# Patient Record
Sex: Female | Born: 1970 | Race: White | Hispanic: No | Marital: Married | Smoking: Former smoker
Health system: Southern US, Community
[De-identification: ages and names within clinical notes are randomized; demographics above are authoritative.]

## PROBLEM LIST (undated history)

## (undated) DIAGNOSIS — M958 Other specified acquired deformities of musculoskeletal system: Secondary | ICD-10-CM

## (undated) DIAGNOSIS — F32A Depression, unspecified: Secondary | ICD-10-CM

## (undated) DIAGNOSIS — J45909 Unspecified asthma, uncomplicated: Secondary | ICD-10-CM

## (undated) DIAGNOSIS — K219 Gastro-esophageal reflux disease without esophagitis: Secondary | ICD-10-CM

## (undated) DIAGNOSIS — R002 Palpitations: Secondary | ICD-10-CM

## (undated) DIAGNOSIS — I499 Cardiac arrhythmia, unspecified: Secondary | ICD-10-CM

## (undated) DIAGNOSIS — M1990 Unspecified osteoarthritis, unspecified site: Secondary | ICD-10-CM

## (undated) DIAGNOSIS — G43909 Migraine, unspecified, not intractable, without status migrainosus: Secondary | ICD-10-CM

## (undated) DIAGNOSIS — M2341 Loose body in knee, right knee: Secondary | ICD-10-CM

## (undated) DIAGNOSIS — G5600 Carpal tunnel syndrome, unspecified upper limb: Secondary | ICD-10-CM

## (undated) DIAGNOSIS — F329 Major depressive disorder, single episode, unspecified: Secondary | ICD-10-CM

## (undated) DIAGNOSIS — F419 Anxiety disorder, unspecified: Secondary | ICD-10-CM

## (undated) DIAGNOSIS — D649 Anemia, unspecified: Secondary | ICD-10-CM

## (undated) HISTORY — PX: TUBAL LIGATION: SHX77

## (undated) HISTORY — PX: DILATION AND CURETTAGE OF UTERUS: SHX78

## (undated) HISTORY — PX: WISDOM TOOTH EXTRACTION: SHX21

---
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
## 2001-04-26 ENCOUNTER — Other Ambulatory Visit: Admission: RE | Admit: 2001-04-26 | Discharge: 2001-04-26 | Payer: Self-pay | Admitting: Obstetrics and Gynecology

## 2002-05-01 ENCOUNTER — Other Ambulatory Visit: Admission: RE | Admit: 2002-05-01 | Discharge: 2002-05-01 | Payer: Self-pay | Admitting: *Deleted

## 2002-07-12 ENCOUNTER — Encounter: Payer: Self-pay | Admitting: Orthopedic Surgery

## 2002-07-12 ENCOUNTER — Ambulatory Visit (HOSPITAL_COMMUNITY): Admission: RE | Admit: 2002-07-12 | Discharge: 2002-07-12 | Payer: Self-pay | Admitting: Orthopedic Surgery

## 2003-09-27 ENCOUNTER — Inpatient Hospital Stay (HOSPITAL_COMMUNITY): Admission: RE | Admit: 2003-09-27 | Discharge: 2003-09-30 | Payer: Self-pay | Admitting: Obstetrics & Gynecology

## 2004-11-04 ENCOUNTER — Ambulatory Visit: Payer: Self-pay | Admitting: Internal Medicine

## 2005-03-18 ENCOUNTER — Observation Stay (HOSPITAL_COMMUNITY): Admission: AD | Admit: 2005-03-18 | Discharge: 2005-03-19 | Payer: Self-pay | Admitting: Internal Medicine

## 2005-03-18 ENCOUNTER — Ambulatory Visit: Payer: Self-pay | Admitting: Internal Medicine

## 2005-03-20 ENCOUNTER — Ambulatory Visit: Payer: Self-pay | Admitting: Internal Medicine

## 2005-03-23 ENCOUNTER — Inpatient Hospital Stay (HOSPITAL_COMMUNITY): Admission: AD | Admit: 2005-03-23 | Discharge: 2005-03-25 | Payer: Self-pay | Admitting: Internal Medicine

## 2005-03-23 ENCOUNTER — Ambulatory Visit: Payer: Self-pay | Admitting: Internal Medicine

## 2005-11-07 ENCOUNTER — Ambulatory Visit: Payer: Self-pay | Admitting: Internal Medicine

## 2006-04-06 ENCOUNTER — Ambulatory Visit: Payer: Self-pay | Admitting: Internal Medicine

## 2006-04-26 ENCOUNTER — Ambulatory Visit: Payer: Self-pay | Admitting: Internal Medicine

## 2006-05-18 ENCOUNTER — Ambulatory Visit: Payer: Self-pay | Admitting: Internal Medicine

## 2007-06-21 ENCOUNTER — Telehealth: Payer: Self-pay | Admitting: Internal Medicine

## 2007-07-04 ENCOUNTER — Encounter: Payer: Self-pay | Admitting: Internal Medicine

## 2007-08-20 DIAGNOSIS — J45909 Unspecified asthma, uncomplicated: Secondary | ICD-10-CM | POA: Insufficient documentation

## 2007-08-20 DIAGNOSIS — K219 Gastro-esophageal reflux disease without esophagitis: Secondary | ICD-10-CM | POA: Insufficient documentation

## 2007-08-24 ENCOUNTER — Telehealth: Payer: Self-pay | Admitting: Internal Medicine

## 2007-10-13 ENCOUNTER — Telehealth: Payer: Self-pay | Admitting: Internal Medicine

## 2007-10-13 ENCOUNTER — Ambulatory Visit: Payer: Self-pay | Admitting: Internal Medicine

## 2007-10-13 DIAGNOSIS — M25539 Pain in unspecified wrist: Secondary | ICD-10-CM | POA: Insufficient documentation

## 2007-10-24 ENCOUNTER — Ambulatory Visit: Payer: Self-pay | Admitting: Internal Medicine

## 2007-12-07 ENCOUNTER — Encounter: Payer: Self-pay | Admitting: Internal Medicine

## 2007-12-11 ENCOUNTER — Encounter: Admission: RE | Admit: 2007-12-11 | Discharge: 2007-12-11 | Payer: Self-pay | Admitting: Internal Medicine

## 2007-12-14 ENCOUNTER — Encounter: Payer: Self-pay | Admitting: Internal Medicine

## 2008-03-15 ENCOUNTER — Telehealth (INDEPENDENT_AMBULATORY_CARE_PROVIDER_SITE_OTHER): Payer: Self-pay | Admitting: *Deleted

## 2008-03-16 ENCOUNTER — Telehealth: Payer: Self-pay | Admitting: Internal Medicine

## 2008-03-21 ENCOUNTER — Ambulatory Visit: Payer: Self-pay | Admitting: Internal Medicine

## 2008-07-19 ENCOUNTER — Ambulatory Visit (HOSPITAL_BASED_OUTPATIENT_CLINIC_OR_DEPARTMENT_OTHER): Admission: RE | Admit: 2008-07-19 | Discharge: 2008-07-19 | Payer: Self-pay | Admitting: Orthopedic Surgery

## 2008-07-19 HISTORY — PX: OTHER SURGICAL HISTORY: SHX169

## 2008-08-20 ENCOUNTER — Ambulatory Visit: Payer: Self-pay | Admitting: Internal Medicine

## 2008-09-17 ENCOUNTER — Ambulatory Visit: Payer: Self-pay | Admitting: Internal Medicine

## 2008-09-19 ENCOUNTER — Encounter: Payer: Self-pay | Admitting: Internal Medicine

## 2008-12-20 ENCOUNTER — Ambulatory Visit: Payer: Self-pay | Admitting: Internal Medicine

## 2009-03-12 ENCOUNTER — Telehealth: Payer: Self-pay | Admitting: Internal Medicine

## 2009-03-15 ENCOUNTER — Ambulatory Visit: Payer: Self-pay | Admitting: Family Medicine

## 2009-06-28 ENCOUNTER — Ambulatory Visit: Payer: Self-pay | Admitting: Internal Medicine

## 2009-09-19 ENCOUNTER — Ambulatory Visit: Payer: Self-pay | Admitting: Family Medicine

## 2009-11-14 ENCOUNTER — Ambulatory Visit: Payer: Self-pay | Admitting: Family Medicine

## 2009-11-14 DIAGNOSIS — J309 Allergic rhinitis, unspecified: Secondary | ICD-10-CM | POA: Insufficient documentation

## 2010-12-10 ENCOUNTER — Ambulatory Visit (HOSPITAL_COMMUNITY): Admission: RE | Admit: 2010-12-10 | Payer: Self-pay | Source: Home / Self Care | Admitting: Obstetrics & Gynecology

## 2010-12-15 ENCOUNTER — Other Ambulatory Visit: Payer: Self-pay | Admitting: Obstetrics & Gynecology

## 2010-12-15 DIAGNOSIS — O09529 Supervision of elderly multigravida, unspecified trimester: Secondary | ICD-10-CM

## 2010-12-29 ENCOUNTER — Other Ambulatory Visit (HOSPITAL_COMMUNITY): Payer: Self-pay | Admitting: Obstetrics & Gynecology

## 2010-12-30 ENCOUNTER — Ambulatory Visit (HOSPITAL_COMMUNITY)
Admission: RE | Admit: 2010-12-30 | Discharge: 2010-12-30 | Disposition: A | Discharge status: Home / Self Care | Payer: Medicaid Other | Source: Ambulatory Visit | Attending: Obstetrics & Gynecology | Admitting: Obstetrics & Gynecology

## 2010-12-30 ENCOUNTER — Other Ambulatory Visit: Payer: Self-pay | Admitting: Obstetrics & Gynecology

## 2010-12-30 DIAGNOSIS — O09529 Supervision of elderly multigravida, unspecified trimester: Secondary | ICD-10-CM

## 2010-12-30 DIAGNOSIS — Z363 Encounter for antenatal screening for malformations: Secondary | ICD-10-CM | POA: Insufficient documentation

## 2010-12-30 DIAGNOSIS — O358XX0 Maternal care for other (suspected) fetal abnormality and damage, not applicable or unspecified: Secondary | ICD-10-CM | POA: Insufficient documentation

## 2010-12-30 DIAGNOSIS — Z1389 Encounter for screening for other disorder: Secondary | ICD-10-CM | POA: Insufficient documentation

## 2011-01-02 ENCOUNTER — Other Ambulatory Visit: Payer: Self-pay | Admitting: *Deleted

## 2011-01-02 DIAGNOSIS — K219 Gastro-esophageal reflux disease without esophagitis: Secondary | ICD-10-CM

## 2011-01-02 MED ORDER — ESOMEPRAZOLE MAGNESIUM 40 MG PO CPDR: 40.0000 mg | DELAYED_RELEASE_CAPSULE | Freq: Every day | ORAL | Status: DC

## 2011-01-02 MED ORDER — BUDESONIDE-FORMOTEROL FUMARATE 160-4.5 MCG/ACT IN AERO: 2.0000 | INHALATION_SPRAY | Freq: Two times a day (BID) | RESPIRATORY_TRACT | Status: DC

## 2011-01-11 ENCOUNTER — Emergency Department (HOSPITAL_COMMUNITY)
Admission: EM | Admit: 2011-01-11 | Discharge: 2011-01-11 | Disposition: A | Discharge status: Home / Self Care | Payer: Self-pay | Attending: Emergency Medicine | Admitting: Emergency Medicine

## 2011-01-11 DIAGNOSIS — R000 Tachycardia, unspecified: Secondary | ICD-10-CM | POA: Insufficient documentation

## 2011-01-11 DIAGNOSIS — J45909 Unspecified asthma, uncomplicated: Secondary | ICD-10-CM | POA: Insufficient documentation

## 2011-04-07 NOTE — Op Note (Signed)
[name], [name]              ACCOUNT NO.:  [id number]   MEDICAL RECORD NO.:  [id number]          PATIENT TYPE:  AMB   LOCATION:  DSC                          FACILITY:  [facility]   PHYSICIAN:  Katy Fitch. Sypher, M.D. DATE OF BIRTH:  Jan 05, 1971   DATE OF PROCEDURE:  07/19/2008  DATE OF DISCHARGE:                               OPERATIVE REPORT   PREOPERATIVE DIAGNOSIS:  Stage IIIB Kienbock disease, left wrist with  loss of lunate height carpal collapse and radiolunate angle greater than  70 degrees.   POSTOPERATIVE DIAGNOSIS:  Stage IIIB Kienbock disease, left wrist with  loss of lunate height carpal collapse and radiolunate angle greater than  70 degrees.   OPERATION:  Reduction of scaphoid collapse and scaphocapitate fusion  utilizing an 18-mm Acutrak II self-tapping screw and a distal 0.035-inch  Kirschner wire after reducing the radial lunate angle to approximately  40 degrees and augmented by local capitate and scaphoid bone graft.   OPERATIONS:  Katy Fitch. Sypher, MD   ASSISTANT:  Marveen Reeks Dasnoit, PA   ANESTHESIA:  General by LMA.  Supervising anesthesiologist is Burna Forts, MD   INDICATIONS:  Shelly [name] is a 40 year old woman who is referred for  evaluation and management of a painful and stiff left wrist.   She was seen in early 2009, it was noted to have stage II Kienbock  disease.   We initially proposed a radial shortening and revascularization of her  lunate, as she had not developed carpal collapse.   She elected to seek an alternative opinion and essentially waited 4  months while not protecting a wrist prior to returning for further  discussions.   Her second opinion concurred with the original treatment plan.   By the time she returned for her second discussion, she was noted to  have advanced to a stage IIIB Kienbock disease with the radiolunate  angle greater than 70 degrees; therefore, we recommended a salvage  procedure with  scaphocapitate arthrodesis.   After informed consent, she was brought to the operating room at this  time.   Preoperatively, she was advised that she would have permanent loss of  wrist range of motion following this procedure, although she already had  lost 50% of wrist motion due to her underlying pathology.   Preoperative questions were invited and answered in detail.  She does  understand that we cannot guarantee complete pain relief nor can we  guarantee that this will fuse with a single attempt.   Questions were invited and answered in detail.   PROCEDURE:  [name] was brought to the operating room and placed  in a supine position on the operating table.   Following an anesthesia consult with Dr. Jacklynn Bue, general anesthesia by  LMA was recommended and accepted.   She was brought to room #3, placed in a supine position on the operating  table, and under Dr. Marlane Mingle direct supervision, general anesthesia  by LMA technique induced.   The left arm was prepped with Betadine soap solution and sterilely  draped.  A pneumatic tourniquet was applied to the proximal  brachium.  Ancef 1 g was administered as an IV prophylactic antibiotic.   Procedure commenced. With exsanguination of the left arm with an Esmarch  bandage, an inflation of the arterial tourniquet to 220 mmHg.  A 3-cm  longitudinal incision was fashioned directly over the scapholunate  ligament and capitate.  Subcutaneous tissues were carefully divided  taking care to identify and spare the extensor retinaculum.  Dorsal  carpal arch was identified, electrocauterized, and a longitudinal  capsulotomy performed.   The scaphocapitate articulation was identified.  Reactive synovitis was  noted.  A synovectomy was performed to facilitate visualization.   The scaphoid was easily reducible to approximately a 40-degree  radioscaphoid angle.  The adjacent surfaces of the capitate and scaphoid  were denuded of  hyaline cartilage utilizing a 4-mm osteotome followed by  curettage with microcurette.  The adjacent margins were then feathered  repeatedly with a fine osteotome and the scaphoid reduced against the  capitate.   Provisional fixation with two 0.035-inch Kirschner wire was accomplished  followed by radiographic documentation of satisfactory reduction.   An 18-mm Acutrak II screw was used with hand reaming.  Drill guides were  used to try to prevent soft tissue entrapment.   The Acutrak screw was placed with standard compression technique leading  to a very satisfactory compression of the scaphoid against the capitate.  A distal 0.035-inch Kirschner wire was utilized to prevent malrotation.   Final AP and lateral C-arm images were obtained including an image  documented a degree of lunate collapse and deformity.   The wound was irrigated followed by repair of the dorsal capsule with  mattress suture of 3-0 Ethibond.  The subcutaneous tissues were repaired  with 4-0 Vicryl followed by intradermal 3-0 Prolene.   The tourniquet was released with immediate capillary refill.  There was  no evidence of bleeding from the radial artery.   A wrist block was placed with 2% lidocaine for postoperative analgesia.  This wound was then placed in a compressive dressing with a sugar-tong  splint maintaining the wrist in neutral position.   There were no apparent complications.      Katy Fitch Sypher, M.D.  Electronically Signed     RVS/MEDQ  D:  07/19/2008  T:  07/20/2008  Job:  [id number]   cc:   Lemmie Evens, M.D.

## 2011-04-10 NOTE — Discharge Summary (Signed)
NAME:  Shelly [name], Shelly [name]                        ACCOUNT NO.:  [id number]   MEDICAL RECORD NO.:  [id number]                   PATIENT TYPE:  INP   LOCATION:  9124                                 FACILITY:  [facility]   PHYSICIAN:  Charles A. Clearance Coots, M.D.             DATE OF BIRTH:  05/25/1971   DATE OF ADMISSION:  09/27/2003  DATE OF DISCHARGE:  09/30/2003                                 DISCHARGE SUMMARY   ADMISSION DIAGNOSIS:  Previous cesarean section declined trial of labor.   DISCHARGE DIAGNOSIS:  Previous cesarean section declined trial of labor  status post repeat low transverse cesarean section on September 27, 2003,  viable female at 1416, Apgars of 8 at one minute 9 at five minutes, weight  of 3990 grams, length of 52 cm.  Mother and infant discharged home in good  condition.   REASON FOR ADMISSION:  A 40 year old G 2, P 1-0-0-1 female with estimated  date of confinement of September 30, 2003 presents at 38-6/7 weeks for a  repeat cesarean section.  The patient declined an offer of a trial of labor.   PAST MEDICAL HISTORY:  Surgery:  Cesarean section.  Illnesses:  Asthma,  GERD.   MEDICATIONS:  Prenatal vitamins, Advair, Nexium.   ALLERGIES:  No known drug allergies.   SOCIAL HISTORY:  Married.  Negative tobacco, alcohol, or recreational drug  use.   FAMILY HISTORY:  Chronic obstructive pulmonary disease.   PHYSICAL EXAMINATION:  GENERAL:  A well-nourished, well-developed female in  no acute distress.  VITAL SIGNS:  Temperature 98.8, pulse 100, respiratory rate 20, blood  pressure 110/62.  Weight 212 pounds.  HEENT:  Normal.  LUNGS:  Clear to auscultation bilaterally.  HEART:  Regular rate and rhythm.  ABDOMEN:  Gravid, soft, nontender.  PELVIC:  Omitted.   HOSPITAL COURSE:  The patient underwent a repeat low transverse cesarean  section on September 27, 2003.  There were no intraoperative complications.  Postoperative course was uncomplicated.  The patient was discharged  home on  postoperative day #3 in good condition.   DISCHARGE LABORATORY DATA:  Hemoglobin 9, hematocrit 28.   DISCHARGE DISPOSITION:  Medications:  Tylox one to two tablets every 3 to 4  hours as needed for pain.  Routine written instructions were given for  obstetrical discharge post cesarean section.  The patient is to call our  office for a follow up in six weeks.                                               Charles A. Clearance Coots, M.D.    CAH/MEDQ  D:  10/11/2003  T:  10/12/2003  Job:  [id number]

## 2011-04-10 NOTE — H&P (Signed)
NAMEMingo [name]             ACCOUNT NO.:  [id number]   MEDICAL RECORD NO.:  [id number]           PATIENT TYPE:   LOCATION:                                 FACILITY:   PHYSICIAN:  Bruce Rexene Edison. Swords, M.D. [facility]   DATE OF BIRTH:   DATE OF ADMISSION:  03/23/2005  DATE OF DISCHARGE:                                HISTORY & PHYSICAL   CHIEF COMPLAINT:  Headache.   HISTORY OF PRESENT ILLNESS:  [name] is a 40 year old female generally  healthy.  She was admitted March 18, 2005 with a headache and fever, thought  to have aseptic meningitis.  Underwent an LP that essentially ruled out this  diagnosis.  She was found to have a round pneumonia.  With antibiotics her  symptoms have essentially resolved.  She has never had any significant  pulmonary symptoms.  Since leaving the hospital, every time she stands up  she has a profound headache.  It is relieved with lying down or bending  over.  She denies any neurologic deficits or any other complaints.   Her past medical history is significant for heartburn and asthma.   Past surgical history is significant for right knee arthroscopy, C section,  and D&C.   CURRENT MEDICATIONS:  1.  __________300 mg p.o. daily, last dose yesterday (needs three more days      of fluoroquinolone).  2.  Advair 150/50 one puff b.i.d.  3.  Nexium 40 mg daily.  4.  Prozac 20 mg p.o. daily.   SOCIAL HISTORY:  As outlined on note March 18, 2005.   REVIEW OF SYSTEMS:  Headache without neurologic deficits.  No other  complaints on the review of systems.   PHYSICAL EXAMINATION:  VITAL SIGNS:  98.8, blood pressure 110/60, heart rate  70, respirations 14.  GENERAL:  She appears well-developed, well-nourished female in no acute  distress.  HEENT:  Atraumatic, normocephalic.  Extraocular muscles are intact.  Conjunctivae are pink.  Sclerae are white.  Optic disks are normal.  No  signs of papilledema.  NECK:  Supple without lymphadenopathy, thyromegaly,  jugular venous  distention, carotid bruits.  No meningismus.  CHEST:  Clear to auscultation.  CARDIAC:  S1, S2 normal.  NEUROLOGIC:  She is alert and oriented without any motor or sensory  deficits.   LABORATORY DATA:  None.   Headache.  Likely, post LP headache. I saw her on Friday. She has been  essentially at bed rest since that time and has not had any relief of her  symptoms despite narcotic analgesics, Advil, and  Tylenol.  She needs  admission and likely a blood patch.  This has been discussed with  interventional radiology and they will evaluate the patient.      BHS/MEDQ  D:  03/23/2005  T:  03/23/2005  Job:  [id number]

## 2011-04-10 NOTE — Discharge Summary (Signed)
Shelly [name], Shelly [name]              ACCOUNT NO.:  [id number]   MEDICAL RECORD NO.:  [id number]          PATIENT TYPE:  INP   LOCATION:  5743                         FACILITY:  [facility]   PHYSICIAN:  Bruce Rexene Edison. Swords, M.D. Romualdo Bolk OF BIRTH:  1971/08/21   DATE OF ADMISSION:  03/18/2005  DATE OF DISCHARGE:  03/19/2005                                 DISCHARGE SUMMARY   DISCHARGE DIAGNOSES:  1.  Pneumonia.  2.  Asthma.   PAST MEDICAL HISTORY:  1.  Depression.  2.  Asthma.   HISTORY OF PRESENT ILLNESS:  The patient was admitted on March 18, 2005. The  patient awoke at 3 a.m. in the morning with chills, fever, and headache. The  patient reported positive photophobia at that time. The patient was admitted  to [facility] on March 18, 2005. The patient underwent  lumbar puncture. A CT of the head was performed which showed no acute  intracranial abnormality but did show some sinus disease.   HOSPITAL COURSE:  On admission, a LP was completed which did not suggest  meningitis. Blood cultures x 2 were sent. Currently, there is no growth to  date. CSF was also sent for culture. Currently, no growth per date. Gram-  stain showed few white blood cell present. CT of the head was negative. The  patient did have a chest x-ray which showed a dominant 5.5 cm round opacity  in the right upper lobe. Radiology thought this is more consistent with a  pediatric pneumonia due to the round nature of the opacity. However,  neoplastic process could not be ruled out. There were also single, smaller  nodular opacities over each lower lung. The patient did report having recent  contact with two children who are sick and her husband is currently  suffering from pneumonia.   The patient was given IV doses of Azithromycin and Rocephin prior to  discharge. Plan to discharge the patient home on p.o. Avelox. In addition,  the patient was also given prescription for Albuterol inhaler as she  reported  some chest tightness during this admission. However, lung sounds  remained clear on examination. O2 saturation this morning was 94% on room  air.   DISCHARGE MEDICATIONS:  1.  Avelox 400 mg 1 tablet daily for 7 days.  2.  Albuterol inhaler 2 puffs q.4h. p.r.n. chest tightness.   FOLLOW UP:  The patient is to follow up with Dr. Valetta Mole. Swords early next  week for follow-up visit in the office.      MSO/MEDQ  D:  03/19/2005  T:  03/20/2005  Job:  [id number]   cc:   Valetta Mole. Swords, M.D. [facility]

## 2011-04-10 NOTE — Discharge Summary (Signed)
NAMESEDA, Shelly [name]              ACCOUNT NO.:  [id number]   MEDICAL RECORD NO.:  [id number]          PATIENT TYPE:  INP   LOCATION:  6703                         FACILITY:  [facility]   PHYSICIAN:  Bruce Rexene Edison. Swords, M.D. Romualdo Bolk OF BIRTH:  13-Nov-1971   DATE OF ADMISSION:  03/23/2005  DATE OF DISCHARGE:  03/25/2005                                 DISCHARGE SUMMARY   DISCHARGE DIAGNOSES:  1.  Post lumbar puncture headache, status post blood patch.  2.  Asthma.  3.  Pneumonia.   HOSPITAL COURSE:  The patient was admitted to the hospital with headache,  status post lumbar puncture on Mar 23, 2005. The patient tolerated blood  patch well, headache resolved, and the patient was discharged on her current  medications.      Bruce Rexene Edison Swords, M.D. [facility]  Electronically Signed     BHS/MEDQ  D:  06/03/2005  T:  06/03/2005  Job:  [id number]

## 2011-04-10 NOTE — H&P (Signed)
Shelly, [name]              ACCOUNT NO.:  [id number]   MEDICAL RECORD NO.:  [id number]          PATIENT TYPE:  INP   LOCATION:  5743                         FACILITY:  [facility]   PHYSICIAN:  Bruce Rexene Edison. Swords, M.D. [facility] OF BIRTH:  1970/12/22   DATE OF ADMISSION:  03/18/2005  DATE OF DISCHARGE:                                HISTORY & PHYSICAL   CHIEF COMPLAINT:  Headache and fever.   HISTORY OF PRESENT ILLNESS:  [name] is a 40 year old female who is  generally healthy. She woke up at 3:00 this morning with fever, chills,  headache, myalgias. The symptoms have worsened and she now complains of  photophobia. In the office, she had an episode of nausea and vomiting, and  she had one this morning as well. She denies any sore throat, abdominal  pain, dysuria, or any other localizing symptoms or any other complaints in  the review of systems.   PAST MEDICAL HISTORY:  Significant for reflux disease and asthma.   PAST SURGICAL HISTORY:  Right knee arthroscopy, cesarean section , and a  D&C.   CURRENT MEDICATIONS:  1.  Multivitamin.  2.  Advair 100/50 one puff b.i.d.  3.  Prozac 20 mg p.o. daily.   SOCIAL HISTORY:  Husband was recently diagnosed with pneumonia. She has a  daughter who is ill and 87 months old, temperature 104. She has not had any  unusual contacts, no travel history, no unusual foods.   FAMILY HISTORY:  Noncontributory.   REVIEW OF SYMPTOMS:  As above.   PHYSICAL EXAMINATION:  VITAL SIGNS:  Temperature 103.5, blood pressure  118/74.  GENERAL:  She appears as an acutely ill female. Appears uncomfortable  keeping her eyes closed in a dark room.  HEENT:  Atraumatic, normocephalic. Extraocular muscles intact. Conjunctivae  are normal. Optic disks are normal without papilledema. ENT exam is normal.  Oropharynx is normal. Posterior pharynx is normal. Nasal mucosa is normal.  NECK:  Supple, however, when she flexes her neck fully, she does have  significant  neck discomfort and increases her headache. The thyroid is  normal.  CHEST:  Clear to auscultation without any increased work of breathing or  dullness to percussion.  CARDIAC:  S1, S2 are normal without murmurs, rubs, or gallops.  ABDOMEN:  Active bowel sounds, soft, nontender. There is no  hepatosplenomegaly.  EXTREMITIES:  There is no clubbing, cyanosis or edema.  NEUROLOGIC:  She is alert and oriented without any motor or sensory  deficits. Cranial nerves are intact.   ASSESSMENT AND PLAN:  Sudden onset, fever, chills, with marked headache,  photophobia, nausea and vomiting. I suspect she has viral meningitis. In the  office she was given Demerol and Phenergan. She needs hospitalization with  an lumbar puncture. Will treat empirically with Rocephin 2 g. Will obtain  appropriate cultures.      BHS/MEDQ  D:  03/18/2005  T:  03/18/2005  Job:  [id number]

## 2011-04-10 NOTE — Op Note (Signed)
NAME:  [name], Shelly [name]                        ACCOUNT NO.:  [id number]   MEDICAL RECORD NO.:  [id number]                   PATIENT TYPE:  INP   LOCATION:  9124                                 FACILITY:  [facility]   PHYSICIAN:  Roseanna Rainbow, M.D.         DATE OF BIRTH:  Dec 22, 1970   DATE OF PROCEDURE:  09/27/2003  DATE OF DISCHARGE:                                 OPERATIVE REPORT   PREOPERATIVE DIAGNOSES:  1. Intrauterine pregnancy at term.  2. History of previous cesarean delivery, declines trial of labor.   POSTOPERATIVE DIAGNOSES:  1. Intrauterine pregnancy at term.  2. History of previous cesarean delivery, declines trial of labor.   PROCEDURE:  Repeat low uterine flap elliptical cesarean delivery via  Pfannenstiel skin incision.   SURGEONS:  Roseanna Rainbow, M.D., Bing Neighbors. Clearance Coots, M.D.   ANESTHESIA:  Spinal.   ESTIMATED BLOOD LOSS:  600 mL.   FLUIDS AND URINE OUTPUT:  As per anesthesiology.   COMPLICATIONS:  None.   INDICATIONS:  The patient is a 40 year old para 1 with a history of previous  cesarean delivery, now with an intrauterine pregnancy at term, who declines  a trial of labor.   DESCRIPTION OF PROCEDURE:  The patient was taken to the operating room.  A  spinal anesthetic was placed without difficulty.  She was then placed in the  dorsal supine position with a leftward tilt.  She was then prepped and  draped in the usual sterile fashion.  A Pfannenstiel skin incision was then  made with the scalpel, carried down to the underlying fascia with the Bovie.  The fascia was incised in the midline and this incision was extended  bilaterally with the Bovie.  The superior aspect of the fascial incision was  then grasped with Kocher clamps, tented up, and the underlying rectus  muscles dissected off.  The inferior aspect of the fascial incision was then  manipulated in a similar fashion.  The rectus muscles were separated in the  midline.  The parietal  peritoneum was tented up and entered sharply with  Metzenbaum scissors. This incision was then extended superiorly and  inferiorly with good visualization of the bladder.  The bladder blade was  then placed and the vesicouterine peritoneum was tented up and entered  sharply with Metzenbaum scissors.  This incision was then extended  bilaterally and the bladder flap created sharply.  The bladder blade was  then replaced.  The lower uterine segment was then incised in a transverse  fashion with the scalpel.  The uterine incision was then extended with  bandage scissors.  The head was then delivered atraumatically.  The infant  was suctioned with the bulb suction, the cord was clamped and cut, and the  infant was handed off to the awaiting neonatologist.  The placenta was then  removed.  The uterus was evacuated of amniotic fluid, clots, and debris with  a moistened laparotomy sponge.  The uterine incision was then repaired in a  running interlocking fashion with 0 Monocryl.  A second layer of the same  suture was used to imbricate this layer.  Adequate hemostasis was noted.  The paracolic gutters were then copiously irrigated.  The parietal  peritoneum was reapproximated with  2-0 Vicryl.  The fascia was reapproximated with 0 PDS.  The skin was then  reapproximated with staples.  At the close of the procedure the instrument  and pack counts were said to be correct x2.  The patient was taken to the  PACU awake and in stable condition.                                               Roseanna Rainbow, M.D.    Judee Clara  D:  09/27/2003  T:  09/27/2003  Job:  [id number]

## 2011-05-05 ENCOUNTER — Inpatient Hospital Stay (HOSPITAL_COMMUNITY)
Admission: AD | Admit: 2011-05-05 | Discharge: 2011-05-06 | Disposition: A | Discharge status: Home / Self Care | Payer: Medicaid Other | Source: Ambulatory Visit | Attending: Obstetrics | Admitting: Obstetrics

## 2011-05-05 DIAGNOSIS — O99891 Other specified diseases and conditions complicating pregnancy: Secondary | ICD-10-CM | POA: Insufficient documentation

## 2011-05-05 DIAGNOSIS — R42 Dizziness and giddiness: Secondary | ICD-10-CM | POA: Insufficient documentation

## 2011-05-05 LAB — URINALYSIS, ROUTINE W REFLEX MICROSCOPIC
Ketones, ur: NEGATIVE mg/dL
Nitrite: NEGATIVE
Protein, ur: NEGATIVE mg/dL
pH: 6 (ref 5.0–8.0)

## 2011-05-05 LAB — URINE MICROSCOPIC-ADD ON

## 2011-05-06 LAB — COMPREHENSIVE METABOLIC PANEL
CO2: 23 mEq/L (ref 19–32)
Calcium: 8.9 mg/dL (ref 8.4–10.5)
Creatinine, Ser: 0.71 mg/dL (ref 0.4–1.2)
GFR calc Af Amer: >60 mL/min (ref >60)
GFR calc non Af Amer: >60 mL/min (ref >60)
Glucose, Bld: 96 mg/dL (ref 70–99)
Total Protein: 5.5 g/dL — ABNORMAL LOW (ref 6.0–8.3)

## 2011-06-02 ENCOUNTER — Encounter (HOSPITAL_COMMUNITY)
Admission: RE | Admit: 2011-06-02 | Discharge: 2011-06-02 | Disposition: A | Discharge status: Home / Self Care | Payer: Medicaid Other | Source: Ambulatory Visit | Attending: Obstetrics & Gynecology | Admitting: Obstetrics & Gynecology

## 2011-06-02 ENCOUNTER — Other Ambulatory Visit (HOSPITAL_COMMUNITY): Payer: Medicaid Other

## 2011-06-02 ENCOUNTER — Encounter (HOSPITAL_COMMUNITY): Payer: Self-pay

## 2011-06-02 HISTORY — DX: Gastro-esophageal reflux disease without esophagitis: K21.9

## 2011-06-02 HISTORY — DX: Depression, unspecified: F32.A

## 2011-06-02 HISTORY — DX: Anxiety disorder, unspecified: F41.9

## 2011-06-02 HISTORY — DX: Major depressive disorder, single episode, unspecified: F32.9

## 2011-06-02 LAB — CBC
Hemoglobin: 9.7 g/dL — ABNORMAL LOW (ref 12.0–15.0)
RBC: 3.62 MIL/uL — ABNORMAL LOW (ref 3.87–5.11)
WBC: 9.7 10*3/uL (ref 4.0–10.5)

## 2011-06-02 LAB — SURGICAL PCR SCREEN
MRSA, PCR: NEGATIVE
Staphylococcus aureus: NEGATIVE

## 2011-06-02 LAB — RPR: RPR Ser Ql: NONREACTIVE

## 2011-06-02 MED ORDER — CEFAZOLIN SODIUM-DEXTROSE 2-3 GM-% IV SOLR
2.0000 g | INTRAVENOUS | Status: AC
  Administered 2011-06-03: 2 g via INTRAVENOUS
  Filled 2011-06-02: qty 50

## 2011-06-02 NOTE — Patient Instructions (Addendum)
20 Shelly [name]  06/02/2011   Your procedure is scheduled on:  Wednesday  Report to [facility] at 12 noon Call this number if you have problems the morning of surgery: [phone]   Remember:   Do not eat food:After Midnight.  Do not drink clear liquids: 4 Hours before arrival.  Take these medicines the morning of surgery with A SIP OF WATER:  None   Do not wear jewelry, make-up or nail polish.  Do not bring valuables to the hospital.  Contacts, dentures or bridgework may not be worn into surgery.  Leave suitcase in the car. After surgery it may be brought to your room.  For patients admitted to the hospital, checkout time is 11:00 AM the day of discharge.   Patients discharged the day of surgery will not be allowed to drive home.  Name and phone number of your driver: x  Special Instructions: Bring Inhaler day of surgery

## 2011-06-02 NOTE — H&P (Signed)
Preoperative H&P  Chief complaint: The patient is a 40 year old para 2 with an intrauterine pregnancy at term and a history of 2 previous cesarean deliveries, also desires a sterilization procedure, and now for an elective repeat cesarean delivery and bilateral tubal ligation.  History of present illness: Please see the above.  Past GYN history: D&C, ASCUS Pap smear.  Past medical history: Asthma, recurrent urinary tract infections  Past surgical history: Please see the above. Left wrist for bone fusion.  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 She denies any tobacco ethanol or drug use.  Family history: Diabetes mellitus, hypertension.  Allergies: No known drug allergies.  Medications: Please see the medication reconciliation documentation.  OB risk factors: Advanced maternal age, history of previous cesarean deliveries  Prenatal labs: Hepatitis B surface antigen negative, hemoglobin 12.1, platelets 256,000, blood type A+, antibody screen negative, rubella immune, RPR nonreactive,  Past obstetrical history: Spontaneous abortion x 1, voluntary termination of pregnancy x 1; November, 2004 the patient was delivered of an 8 pound female, full-term, via a cesarean delivery;  May,2000 she was delivered of a liveborn female, birth weight 7 pounds, full term via a cesarean delivery.   Physical examination  Vital signs: Vital signs stable afebrile, no apparent distress  General: Well-developed well-nourished Caucasian female in no apparent distress  Chest: Lungs clear to auscultation bilaterally new  Heart: Regular rate and rhythm, no murmurs  Abdomen: Fundal height term. Nontender  Pelvic exam: Deferred  Extremities: No clubbing cyanosis or edema  Assessment and plan:  Intrauterine pregnancy at 39+ weeks with a history of 2 previous cesarean deliveries, desires a sterilization procedure now for an elective repeat cesarean delivery and bilateral tubal ligation.  The risks benefits and alternative forms of management have been reviewed with the patient and informed consent had been obtained.  Questions were answered to the patient's stated satisfaction.  Admit. Repeat cesarean delivery and bilateral tubal ligation.

## 2011-06-03 ENCOUNTER — Encounter (HOSPITAL_COMMUNITY)
Admission: RE | Disposition: A | Discharge status: Home / Self Care | Payer: Self-pay | Source: Ambulatory Visit | Attending: Obstetrics & Gynecology

## 2011-06-03 ENCOUNTER — Encounter (HOSPITAL_COMMUNITY): Payer: Self-pay | Admitting: *Deleted

## 2011-06-03 ENCOUNTER — Encounter (HOSPITAL_COMMUNITY): Payer: Self-pay | Admitting: Anesthesiology

## 2011-06-03 ENCOUNTER — Inpatient Hospital Stay (HOSPITAL_COMMUNITY)
Admission: RE | Admit: 2011-06-03 | Discharge: 2011-06-05 | DRG: 766 | Disposition: A | Discharge status: Home / Self Care | Payer: Medicaid Other | Source: Ambulatory Visit | Attending: Obstetrics & Gynecology | Admitting: Obstetrics & Gynecology

## 2011-06-03 ENCOUNTER — Inpatient Hospital Stay (HOSPITAL_COMMUNITY): Payer: Medicaid Other | Admitting: Anesthesiology

## 2011-06-03 ENCOUNTER — Other Ambulatory Visit: Payer: Self-pay | Admitting: Obstetrics & Gynecology

## 2011-06-03 DIAGNOSIS — O09529 Supervision of elderly multigravida, unspecified trimester: Secondary | ICD-10-CM | POA: Diagnosis present

## 2011-06-03 DIAGNOSIS — O34219 Maternal care for unspecified type scar from previous cesarean delivery: Principal | ICD-10-CM | POA: Diagnosis present

## 2011-06-03 LAB — TYPE AND SCREEN
ABO/RH(D): A POS
Antibody Screen: NEGATIVE

## 2011-06-03 SURGERY — Surgical Case
Anesthesia: Spinal

## 2011-06-03 MED ORDER — SODIUM CHLORIDE 0.9 % IR SOLN
Status: DC | PRN
  Administered 2011-06-03: 1000 mL

## 2011-06-03 MED ORDER — ONDANSETRON HCL 4 MG/2ML IJ SOLN
INTRAMUSCULAR | Status: DC | PRN
  Administered 2011-06-03: 4 mg via INTRAVENOUS

## 2011-06-03 MED ORDER — KETOROLAC TROMETHAMINE 30 MG/ML IJ SOLN: 30.0000 mg | Freq: Four times a day (QID) | INTRAMUSCULAR | Status: DC | PRN

## 2011-06-03 MED ORDER — SIMETHICONE 80 MG PO CHEW
80.0000 mg | CHEWABLE_TABLET | ORAL | Status: DC | PRN
  Administered 2011-06-03 – 2011-06-04 (×4): 80 mg via ORAL

## 2011-06-03 MED ORDER — OXYTOCIN 20 UNITS IN LACTATED RINGERS INFUSION - SIMPLE: 125.0000 mL/h | INTRAVENOUS | Status: AC

## 2011-06-03 MED ORDER — HYDROMORPHONE HCL 1 MG/ML IJ SOLN
0.2500 mg | INTRAMUSCULAR | Status: DC | PRN
  Administered 2011-06-03 (×2): 0.5 mg via INTRAVENOUS

## 2011-06-03 MED ORDER — LACTATED RINGERS IV SOLN
INTRAVENOUS | Status: DC
  Administered 2011-06-03 (×4): via INTRAVENOUS

## 2011-06-03 MED ORDER — IBUPROFEN 200 MG PO TABS: 600.0000 mg | ORAL_TABLET | Freq: Four times a day (QID) | ORAL | Status: DC | PRN

## 2011-06-03 MED ORDER — MEPERIDINE HCL 25 MG/ML IJ SOLN
INTRAMUSCULAR | Status: DC | PRN
  Administered 2011-06-03: 25 mg via INTRAVENOUS

## 2011-06-03 MED ORDER — TETANUS-DIPHTH-ACELL PERTUSSIS 5-2.5-18.5 LF-MCG/0.5 IM SUSP
0.5000 mL | Freq: Once | INTRAMUSCULAR | Status: DC
  Filled 2011-06-03: qty 0.5

## 2011-06-03 MED ORDER — SCOPOLAMINE 1 MG/3DAYS TD PT72
1.0000 | MEDICATED_PATCH | Freq: Once | TRANSDERMAL | Status: DC
  Administered 2011-06-03: 12.5 mg via TRANSDERMAL

## 2011-06-03 MED ORDER — KETOROLAC TROMETHAMINE 60 MG/2ML IM SOLN: 60.0000 mg | Freq: Once | INTRAMUSCULAR | Status: DC | PRN

## 2011-06-03 MED ORDER — FENTANYL CITRATE 0.05 MG/ML IJ SOLN
INTRAMUSCULAR | Status: DC | PRN
  Administered 2011-06-03: 15 ug via INTRATHECAL
  Administered 2011-06-03: 85 ug via INTRAVENOUS

## 2011-06-03 MED ORDER — MORPHINE SULFATE (PF) 0.5 MG/ML IJ SOLN
INTRAMUSCULAR | Status: DC | PRN
  Administered 2011-06-03: 9.9 mg via EPIDURAL
  Administered 2011-06-03: .1 mg via INTRATHECAL

## 2011-06-03 MED ORDER — MEPERIDINE HCL 25 MG/ML IJ SOLN: 6.2500 mg | INTRAMUSCULAR | Status: DC | PRN

## 2011-06-03 MED ORDER — ALBUTEROL SULFATE HFA 108 (90 BASE) MCG/ACT IN AERS
1.0000 | INHALATION_SPRAY | Freq: Every day | RESPIRATORY_TRACT | Status: DC | PRN
  Filled 2011-06-03: qty 6.7

## 2011-06-03 MED ORDER — DIPHENHYDRAMINE HCL 25 MG PO CAPS: 25.0000 mg | ORAL_CAPSULE | Freq: Four times a day (QID) | ORAL | Status: DC | PRN

## 2011-06-03 MED ORDER — BUPIVACAINE HCL 0.75 % IJ SOLN
INTRAMUSCULAR | Status: DC | PRN
  Administered 2011-06-03: 11.75 mg via INTRATHECAL

## 2011-06-03 MED ORDER — PHENYLEPHRINE HCL 10 MG/ML IJ SOLN
INTRAMUSCULAR | Status: DC | PRN
  Administered 2011-06-03 (×6): 40 ug via INTRAVENOUS

## 2011-06-03 MED ORDER — ONDANSETRON HCL 4 MG/2ML IJ SOLN
INTRAMUSCULAR | Status: AC
  Filled 2011-06-03: qty 2

## 2011-06-03 MED ORDER — MEPERIDINE HCL 25 MG/ML IJ SOLN
INTRAMUSCULAR | Status: AC
  Filled 2011-06-03: qty 1

## 2011-06-03 MED ORDER — WITCH HAZEL-GLYCERIN EX PADS: MEDICATED_PAD | CUTANEOUS | Status: DC | PRN

## 2011-06-03 MED ORDER — NALBUPHINE HCL 10 MG/ML IJ SOLN
5.0000 mg | INTRAMUSCULAR | Status: AC | PRN
  Filled 2011-06-03: qty 1

## 2011-06-03 MED ORDER — OXYTOCIN 20 UNITS IN LACTATED RINGERS INFUSION - SIMPLE
INTRAVENOUS | Status: DC | PRN
  Administered 2011-06-03 (×2): 20 [IU] via INTRAVENOUS

## 2011-06-03 MED ORDER — SENNOSIDES-DOCUSATE SODIUM 8.6-50 MG PO TABS
1.0000 | ORAL_TABLET | Freq: Every day | ORAL | Status: DC
Start: 2011-06-03 — End: 2011-06-06
  Administered 2011-06-03 – 2011-06-04 (×2): 2 via ORAL

## 2011-06-03 MED ORDER — ZOLPIDEM TARTRATE 5 MG PO TABS: 5.0000 mg | ORAL_TABLET | Freq: Every evening | ORAL | Status: DC | PRN

## 2011-06-03 MED ORDER — NALOXONE HCL 0.4 MG/ML IJ SOLN: 0.4000 mg | INTRAMUSCULAR | Status: DC | PRN

## 2011-06-03 MED ORDER — METOCLOPRAMIDE HCL 5 MG/ML IJ SOLN: 10.0000 mg | Freq: Three times a day (TID) | INTRAMUSCULAR | Status: DC | PRN

## 2011-06-03 MED ORDER — ONDANSETRON HCL 4 MG/2ML IJ SOLN: 4.0000 mg | Freq: Three times a day (TID) | INTRAMUSCULAR | Status: DC | PRN

## 2011-06-03 MED ORDER — SODIUM CHLORIDE 0.9 % IJ SOLN: 3.0000 mL | INTRAMUSCULAR | Status: DC | PRN

## 2011-06-03 MED ORDER — PRENATAL PLUS 27-1 MG PO TABS
1.0000 | ORAL_TABLET | Freq: Every day | ORAL | Status: DC
  Administered 2011-06-04 – 2011-06-05 (×2): 1 via ORAL
  Filled 2011-06-03 (×2): qty 1

## 2011-06-03 MED ORDER — OXYCODONE-ACETAMINOPHEN 5-325 MG PO TABS
1.0000 | ORAL_TABLET | ORAL | Status: DC | PRN
  Administered 2011-06-04 (×3): 1 via ORAL
  Administered 2011-06-05: 2 via ORAL
  Filled 2011-06-03: qty 2
  Filled 2011-06-03 (×3): qty 1

## 2011-06-03 MED ORDER — MORPHINE SULFATE 0.5 MG/ML IJ SOLN
INTRAMUSCULAR | Status: AC
  Filled 2011-06-03: qty 10

## 2011-06-03 MED ORDER — DIPHENHYDRAMINE HCL 50 MG/ML IJ SOLN: 25.0000 mg | INTRAMUSCULAR | Status: DC | PRN

## 2011-06-03 MED ORDER — RHO D IMMUNE GLOBULIN 1500 UNIT/2ML IJ SOLN: 300.0000 ug | Freq: Once | INTRAMUSCULAR | Status: DC

## 2011-06-03 MED ORDER — EPHEDRINE 5 MG/ML INJ
INTRAVENOUS | Status: AC
  Filled 2011-06-03: qty 10

## 2011-06-03 MED ORDER — BUDESONIDE-FORMOTEROL FUMARATE 160-4.5 MCG/ACT IN AERO
1.0000 | INHALATION_SPRAY | Freq: Every day | RESPIRATORY_TRACT | Status: DC
  Filled 2011-06-03: qty 6

## 2011-06-03 MED ORDER — SCOPOLAMINE 1 MG/3DAYS TD PT72
MEDICATED_PATCH | TRANSDERMAL | Status: AC
  Administered 2011-06-03: 12.5 mg via TRANSDERMAL
  Filled 2011-06-03: qty 1

## 2011-06-03 MED ORDER — EPHEDRINE SULFATE 50 MG/ML IJ SOLN
INTRAMUSCULAR | Status: DC | PRN
  Administered 2011-06-03 (×2): 5 mg via INTRAVENOUS
  Administered 2011-06-03: 10 mg via INTRAVENOUS

## 2011-06-03 MED ORDER — PHENYLEPHRINE 40 MCG/ML (10ML) SYRINGE FOR IV PUSH (FOR BLOOD PRESSURE SUPPORT)
PREFILLED_SYRINGE | INTRAVENOUS | Status: AC
  Filled 2011-06-03: qty 5

## 2011-06-03 MED ORDER — ONDANSETRON HCL 4 MG/2ML IJ SOLN: 4.0000 mg | INTRAMUSCULAR | Status: DC | PRN

## 2011-06-03 MED ORDER — DIPHENHYDRAMINE HCL 50 MG/ML IJ SOLN
12.5000 mg | INTRAMUSCULAR | Status: DC | PRN
  Administered 2011-06-03: 12.5 mg via INTRAVENOUS

## 2011-06-03 MED ORDER — NALOXONE HCL 0.4 MG/ML IJ SOLN: 1.0000 ug/kg/h | INTRAMUSCULAR | Status: DC | PRN

## 2011-06-03 MED ORDER — FENTANYL CITRATE 0.05 MG/ML IJ SOLN
INTRAMUSCULAR | Status: AC
  Filled 2011-06-03: qty 2

## 2011-06-03 MED ORDER — OXYTOCIN 10 UNIT/ML IJ SOLN
INTRAMUSCULAR | Status: AC
  Filled 2011-06-03: qty 2

## 2011-06-03 MED ORDER — IBUPROFEN 600 MG PO TABS
ORAL_TABLET | ORAL | Status: AC
  Administered 2011-06-03: 18:00:00
  Filled 2011-06-03: qty 1

## 2011-06-03 MED ORDER — MEASLES, MUMPS & RUBELLA VAC ~~LOC~~ INJ
0.5000 mL | INJECTION | Freq: Once | SUBCUTANEOUS | Status: DC
  Filled 2011-06-03: qty 0.5

## 2011-06-03 MED ORDER — DIPHENHYDRAMINE HCL 25 MG PO CAPS
25.0000 mg | ORAL_CAPSULE | ORAL | Status: DC | PRN
  Filled 2011-06-03: qty 1

## 2011-06-03 MED ORDER — FERROUS SULFATE 325 (65 FE) MG PO TABS
325.0000 mg | ORAL_TABLET | Freq: Two times a day (BID) | ORAL | Status: DC
  Administered 2011-06-04 – 2011-06-05 (×4): 325 mg via ORAL
  Filled 2011-06-03 (×4): qty 1

## 2011-06-03 MED ORDER — IBUPROFEN 600 MG PO TABS
600.0000 mg | ORAL_TABLET | Freq: Four times a day (QID) | ORAL | Status: DC
  Administered 2011-06-03 – 2011-06-05 (×8): 600 mg via ORAL
  Filled 2011-06-03 (×9): qty 1

## 2011-06-03 MED ORDER — MAGNESIUM HYDROXIDE 400 MG/5ML PO SUSP: 30.0000 mL | ORAL | Status: DC | PRN

## 2011-06-03 SURGICAL SUPPLY — 43 items
CANISTER WOUND CARE 500ML ATS (WOUND CARE) IMPLANT
CHLORAPREP W/TINT 26ML (MISCELLANEOUS) ×2 IMPLANT
CLOTH BEACON ORANGE TIMEOUT ST (SAFETY) ×2 IMPLANT
CONTAINER PREFILL 10% NBF 15ML (MISCELLANEOUS) ×2 IMPLANT
DERMABOND ADVANCED (GAUZE/BANDAGES/DRESSINGS) ×2 IMPLANT
DRAPE UTILITY XL STRL (DRAPES) ×2 IMPLANT
DRESSING TELFA 8X3 (GAUZE/BANDAGES/DRESSINGS) ×2 IMPLANT
DRSG VAC ATS LRG SENSATRAC (GAUZE/BANDAGES/DRESSINGS) IMPLANT
DRSG VAC ATS MED SENSATRAC (GAUZE/BANDAGES/DRESSINGS) IMPLANT
DRSG VAC ATS SM SENSATRAC (GAUZE/BANDAGES/DRESSINGS) IMPLANT
ELECT REM PT RETURN 9FT ADLT (ELECTROSURGICAL) ×2
ELECTRODE REM PT RTRN 9FT ADLT (ELECTROSURGICAL) ×1 IMPLANT
EXTRACTOR VACUUM M CUP 4 TUBE (SUCTIONS) ×1 IMPLANT
GAUZE SPONGE 4X4 12PLY STRL LF (GAUZE/BANDAGES/DRESSINGS) ×4 IMPLANT
GLOVE BIO SURGEON STRL SZ 6.5 (GLOVE) ×4 IMPLANT
GOWN BRE IMP SLV AUR LG STRL (GOWN DISPOSABLE) ×6 IMPLANT
KIT ABG SYR 3ML LUER SLIP (SYRINGE) IMPLANT
NDL HYPO 25X5/8 SAFETYGLIDE (NEEDLE) ×1 IMPLANT
NEEDLE HYPO 25X5/8 SAFETYGLIDE (NEEDLE) ×2 IMPLANT
NS IRRIG 1000ML POUR BTL (IV SOLUTION) ×2 IMPLANT
PACK C SECTION WH (CUSTOM PROCEDURE TRAY) ×2 IMPLANT
PAD ABD 5X9 TENDERSORB (GAUZE/BANDAGES/DRESSINGS) ×1 IMPLANT
PAD ABD 7.5X8 STRL (GAUZE/BANDAGES/DRESSINGS) ×2 IMPLANT
RTRCTR C-SECT PINK 25CM LRG (MISCELLANEOUS) IMPLANT
RTRCTR C-SECT PINK 34CM XLRG (MISCELLANEOUS) IMPLANT
SLEEVE SCD COMPRESS KNEE MED (MISCELLANEOUS) IMPLANT
STAPLER VISISTAT 35W (STAPLE) ×2 IMPLANT
SUT MNCRL 0 VIOLET CTX 36 (SUTURE) ×2 IMPLANT
SUT MNCRL AB 3-0 PS2 27 (SUTURE) ×2 IMPLANT
SUT MONOCRYL 0 CTX 36 (SUTURE) ×2
SUT PDS AB 0 CT1 27 (SUTURE) IMPLANT
SUT PLAIN 0 NONE (SUTURE) ×1 IMPLANT
SUT VIC AB 0 CT1 27 (SUTURE)
SUT VIC AB 0 CT1 27XBRD ANBCTR (SUTURE) IMPLANT
SUT VIC AB 0 CTXB 36 (SUTURE) ×4 IMPLANT
SUT VIC AB 2-0 CT1 (SUTURE) IMPLANT
SUT VIC AB 2-0 CT1 27 (SUTURE) ×2
SUT VIC AB 2-0 CT1 TAPERPNT 27 (SUTURE) ×1 IMPLANT
SUT VIC AB 2-0 SH 27 (SUTURE)
SUT VIC AB 2-0 SH 27XBRD (SUTURE) IMPLANT
TOWEL OR 17X24 6PK STRL BLUE (TOWEL DISPOSABLE) ×4 IMPLANT
TRAY FOLEY CATH 14FR (SET/KITS/TRAYS/PACK) ×2 IMPLANT
WATER STERILE IRR 1000ML POUR (IV SOLUTION) ×2 IMPLANT

## 2011-06-03 NOTE — Transfer of Care (Signed)
Immediate Anesthesia Transfer of Care Note  Patient: Shelly [name]  Procedure(s) Performed:  CESAREAN SECTION WITH BILATERAL TUBAL LIGATION - Repeat  Patient Location: PACU  Anesthesia Type: Spinal  Level of Consciousness: awake, alert , oriented and patient cooperative  Airway & Oxygen Therapy: Patient Spontanous Breathing and Patient connected to nasal cannula oxygen  Post-op Assessment: Report given to PACU RN and Post -op Vital signs reviewed and stable  Post vital signs: Reviewed and stable  Complications: No apparent anesthesia complications

## 2011-06-03 NOTE — Anesthesia Preprocedure Evaluation (Addendum)
Anesthesia Evaluation  Name, MR# and DOB Patient awake  General Assessment Comment  Reviewed: Allergy & Precautions, H&P  and Patient's Chart, lab work & pertinent test results  History of Anesthesia Complications Negative for: history of anesthetic complications  Airway Mallampati: I TM Distance: >3 FB Neck ROM: full    Dental  (+) Teeth Intact   Pulmonary  asthma (well controlled, uses rescue inh 2-3 x/week)  clear to auscultation  pulmonary exam normal   Cardiovascular regular Normal   Neuro/Psych (+) {AN ROS/MED HX NEURO HEADACHES (+) PSYCHIATRIC DISORDERS (depression, anxiety), Negative Neurological ROS   GI/Hepatic/Renal negative Liver ROS, and negative Renal ROS (+)  GERD Medicated and Controlled     Endo/Other  Negative Endocrine ROS (+)   Abdominal   Musculoskeletal  Hematology negative hematology ROS (+)   Peds  Reproductive/Obstetrics (+) Pregnancy   Anesthesia Other Findings             Anesthesia Physical Anesthesia Plan  ASA: II  Anesthesia Plan: Spinal   Post-op Pain Management:    Induction:   Airway Management Planned:   Additional Equipment:   Intra-op Plan:   Post-operative Plan:   Informed Consent: I have reviewed the patients History and Physical, chart, labs and discussed the procedure including the risks, benefits and alternatives for the proposed anesthesia with the patient or authorized representative who has indicated his/her understanding and acceptance.     Plan Discussed with: CRNA and Surgeon  Anesthesia Plan Comments:         Anesthesia Quick Evaluation

## 2011-06-03 NOTE — Consult Note (Signed)
Delivery Note   06/03/2011  2:36 PM  I was called to the operating room at the request of the patient's obstetrician.  The c/section delivery was uncomplicated.  The baby looked well.     Ruben Gottron, MD Neonatologist

## 2011-06-03 NOTE — H&P (Signed)
Preoperative H&P  Chief complaint: The patient is a 40 year old para 2 with an intrauterine pregnancy at term and a history of 2 previous cesarean deliveries, also desires a sterilization procedure, and now for an elective repeat cesarean delivery and bilateral tubal ligation.  History of present illness: Please see the above.  Past GYN history: D&C, ASCUS Pap smear.  Past medical history: Asthma, recurrent urinary tract infections  Past surgical history: Please see the above. Left wrist for bone fusion.  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 She denies any tobacco ethanol or drug use.  Family history: Diabetes mellitus, hypertension.  Allergies: No known drug allergies.  Medications: Please see the medication reconciliation documentation.  OB risk factors: Advanced maternal age, history of previous cesarean deliveries  Prenatal labs: Hepatitis B surface antigen negative, hemoglobin 12.1, platelets 256,000, blood type A+, antibody screen negative, rubella immune, RPR nonreactive,  Past obstetrical history: Spontaneous abortion x 1, voluntary termination of pregnancy x 1; November, 2004 the patient was delivered of an 8 pound female, full-term, via a cesarean delivery;  May,2000 she was delivered of a liveborn female, birth weight 7 pounds, full term via a cesarean delivery.   Physical examination  Vital signs: Vital signs stable afebrile, no apparent distress  General: Well-developed well-nourished Caucasian female in no apparent distress  Chest: Lungs clear to auscultation bilaterally new  Heart: Regular rate and rhythm, no murmurs  Abdomen: Fundal height term. Nontender  Pelvic exam: Deferred  Extremities: No clubbing cyanosis or edema  Assessment and plan:  Intrauterine pregnancy at 39+ weeks with a history of 2 previous cesarean deliveries, desires a sterilization procedure now for an elective repeat cesarean delivery and bilateral tubal ligation.  The risks benefits and alternative forms of management have been reviewed with the patient and informed consent had been obtained.  Questions were answered to the patient's stated satisfaction.  Admit. Repeat cesarean delivery and bilateral tubal ligation.

## 2011-06-03 NOTE — Anesthesia Procedure Notes (Signed)
Spinal Block  Patient location during procedure: OR Start time: 06/03/2011 1:38 PM Staffing Anesthesiologist: CASSIDY, AMY L. Performed by: anesthesiologist  Preanesthetic Checklist Completed: patient identified, site marked, surgical consent, pre-op evaluation, timeout performed, IV checked, risks and benefits discussed and monitors and equipment checked Spinal Block Patient position: sitting Prep: site prepped and draped and DuraPrep Patient monitoring: blood pressure, continuous pulse ox and heart rate Approach: midline Location: L3-4 Injection technique: single-shot Needle Needle type: Sprotte and Introducer  Needle gauge: 24 G Needle length: 5 cm Assessment Sensory level: T4 Additional Notes 1% lidocaine skin wheal, clear free flow CSF x1 attempt

## 2011-06-03 NOTE — Brief Op Note (Signed)
Cesarean Section Procedure Note   [name]   06/03/2011  Indications: Scheduled Proceedure/Maternal Request   Pre-operative Diagnosis: Previous cesarean section.   Post-operative Diagnosis: Same   Surgeon: Antionette Char A  Assistants: Coral Ceo, MD  Anesthesia: spinal  Procedure Details:  The patient was seen in the Holding Room. The risks, benefits, complications, treatment options, and expected outcomes were discussed with the patient. The patient concurred with the proposed plan, giving informed consent. The patient was identified as Shelly [name] and the procedure verified as C-Section Delivery. A Time Out was held and the above information confirmed.  After induction of anesthesia, the patient was draped and prepped in the usual sterile manner. A transverse incision was made and carried down through the subcutaneous tissue to the fascia. The fascial incision was made and extended transversely. The fascia was separated from the underlying rectus tissue superiorly and inferiorly. The peritoneum was identified and entered. The peritoneal incision was extended longitudinally. The utero-vesical peritoneal reflection was incised transversely and the bladder flap was bluntly freed from the lower uterine segment. A low transverse uterine incision was made. Delivered from cephalic presentation was a 7lb 6oz  living newborn femaleinfant(s) with Apgar scores of 9 at one minute and 9 at five minutes. A vacuum-assisted delivery was performed.  There was one pop-off.  A cord ph was not sent. The umbilical cord was clamped and cut cord. A sample was obtained for evaluation. The placenta was removed Intact and appeared normal.  The uterine incision was closed with running locked sutures of 1-0 Monocryl. A second imbricating layer of the same suture was placed.  Hemostasis was observed. The paracolic gutters were irrigated. The fascia was then reapproximated with running sutures of  1-0Vicryl. The subcuticular closure was performed using 3-0 Monocryl.  Instrument, sponge, and needle counts were correct prior the abdominal closure and were correct at the conclusion of the case.    Findings: See above.   Estimated Blood Loss:  600 ml  Total IV Fluids: 2000 ml   Urine Output: 300CC OF clear urine  Specimens:  Specimens    None       Complications: no complications  Disposition: PACU - hemodynamically stable.  Maternal Condition: stable   Baby condition / location:  nursery-stable    Signed: Surgeon(s): Roseanna Rainbow, MD Brock Bad, MD

## 2011-06-03 NOTE — H&P (Signed)
History and Physical Interval Note:   06/03/2011   1:16 PM   Berlie Ventrella  has presented today for surgery, with the diagnosis of Previous cesarean section  The various methods of treatment have been discussed with the patient and family. After consideration of risks, benefits and other options for treatment, the patient has consented to  Procedure(s): CESAREAN SECTION WITH BILATERAL TUBAL LIGATION as a surgical intervention .  I have reviewed the patients' chart and labs.  Questions were answered to the patient's satisfaction.     Roseanna Rainbow  MD

## 2011-06-03 NOTE — H&P (Deleted)
Preoperative H&P  Chief complaint: The patient is a 40-year-old para 2 with an intrauterine pregnancy at term and a history of 2 previous cesarean deliveries, also desires a sterilization procedure, and now for an elective repeat cesarean delivery and bilateral tubal ligation.  History of present illness: Please see the above.  Past GYN history: D&C, ASCUS Pap smear.  Past medical history: Asthma, recurrent urinary tract infections  Past surgical history: Please see the above. Left wrist for bone fusion.  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 She denies any tobacco ethanol or drug use.  Family history: Diabetes mellitus, hypertension.  Allergies: No known drug allergies.  Medications: Please see the medication reconciliation documentation.  OB risk factors: Advanced maternal age, history of previous cesarean deliveries  Prenatal labs: Hepatitis B surface antigen negative, hemoglobin 12.1, platelets 256,000, blood type A+, antibody screen negative, rubella immune, RPR nonreactive,  Past obstetrical history: Spontaneous abortion x 1, voluntary termination of pregnancy x 1; November, 2004 the patient was delivered of an 8 pound female, full-term, via a cesarean delivery;  May,2000 she was delivered of a liveborn female, birth weight 7 pounds, full term via a cesarean delivery.   Physical examination  Vital signs: Vital signs stable afebrile, no apparent distress  General: Well-developed well-nourished Caucasian female in no apparent distress  Chest: Lungs clear to auscultation bilaterally new  Heart: Regular rate and rhythm, no murmurs  Abdomen: Fundal height term. Nontender  Pelvic exam: Deferred  Extremities: No clubbing cyanosis or edema  Assessment and plan:  Intrauterine pregnancy at 39+ weeks with a history of 2 previous cesarean deliveries, desires a sterilization procedure now for an elective repeat cesarean delivery and bilateral tubal ligation.  The risks benefits and alternative forms of management have been reviewed with the patient and informed consent had been obtained.  Questions were answered to the patient's stated satisfaction.  Admit. Repeat cesarean delivery and bilateral tubal ligation. 

## 2011-06-03 NOTE — Anesthesia Postprocedure Evaluation (Signed)
  Anesthesia Post-op Note  Patient: Shelly [name]  Procedure(s) Performed:  CESAREAN SECTION WITH BILATERAL TUBAL LIGATION - Repeat  No anesthesia complications.  Level of consciousness: alert. Cardiopulmonary status stable.  No follow-up care or observation required.  Franciscojavier Wronski L. Rodman Pickle, MD

## 2011-06-04 LAB — CBC
HCT: 25.3 % — ABNORMAL LOW (ref 36.0–46.0)
MCHC: 32 g/dL (ref 30.0–36.0)
Platelets: 162 10*3/uL (ref 150–400)
RDW: 14.5 % (ref 11.5–15.5)
WBC: 10 10*3/uL (ref 4.0–10.5)

## 2011-06-04 NOTE — Progress Notes (Signed)
UR chart review completed.  

## 2011-06-05 ENCOUNTER — Encounter (HOSPITAL_COMMUNITY): Payer: Self-pay | Admitting: *Deleted

## 2011-06-05 MED ORDER — PRENATAL PLUS 27-1 MG PO TABS: 1.0000 | ORAL_TABLET | Freq: Every day | ORAL | Status: DC

## 2011-06-05 MED ORDER — IBUPROFEN 600 MG PO TABS: 600.0000 mg | ORAL_TABLET | Freq: Four times a day (QID) | ORAL | Status: AC

## 2011-06-05 MED ORDER — OXYCODONE-ACETAMINOPHEN 5-325 MG PO TABS: 1.0000 | ORAL_TABLET | ORAL | Status: AC | PRN

## 2011-06-05 NOTE — Discharge Summary (Signed)
Obstetric Discharge Summary Reason for Admission: cesarean section Prenatal Procedures: ultrasound Intrapartum Procedures: cesarean: low cervical, transverse Postpartum Procedures: none Complications-Operative and Postpartum: none  Hemoglobin  Date Value Range Status  06/04/2011 8.1* 12.0-15.0 (g/dL) Final     HCT  Date Value Range Status  06/04/2011 25.3* 36.0-46.0 (%) Final    Discharge Diagnoses: Elective Repeat Cesarean Section.`  Discharge Information: Date: 06/05/2011 Activity: unrestricted Diet: routine Medications: PNV, Ibuprophen and Percocet Condition: stable Instructions: refer to practice specific booklet Discharge to: home Follow-up Information    Follow up with Roseanna Rainbow, MD. Make an appointment in 2 weeks.   Contact information:   [address] [place] [address] [phone]          Newborn Data: Live born  Information for the patient's newborn:  Heslin, Girl Jetaun [[id number]]  female ; APGAR 9-9 , ; weight ; 7lbs. 6oz. Home with mother.  Dyamon Sosinski A 06/05/2011, 4:50 PM

## 2011-06-05 NOTE — Progress Notes (Signed)
Subjective: Postpartum Day 2: Cesarean Delivery Patient reports tolerating PO, + flatus, + BM and no problems voiding.    Objective: Vital signs in last 24 hours: Temp:  [97.6 F (36.4 C)-98.6 F (37 C)] 98.5 F (36.9 C) (07/13 1300) Pulse Rate:  [64-82] 80  (07/13 1300) Resp:  [18-20] 18  (07/13 1300) BP: (108-126)/(56-73) 126/73 mmHg (07/13 1300) SpO2:  [92 %] 92 % (07/12 2200)  Physical Exam:  General: alert and no distress Lochia: appropriate Uterine Fundus: firm Incision: healing well DVT Evaluation: No evidence of DVT seen on physical exam.   Basename 06/04/11 0515  HGB 8.1*  HCT 25.3*    Assessment/Plan: Status post Cesarean section. Doing well postoperatively.  Discharge home with standard precautions and return to clinic in 2 weeks.  Nevena Rozenberg A 06/05/2011, 4:47 PM

## 2011-07-06 NOTE — Consult Note (Signed)
NAME:  Shelly [name], Shelly [name] NO.:  [id number]  MEDICAL RECORD NO.:  [id number]  LOCATION:                                 FACILITY:  PHYSICIAN:  Charles A. Clearance Coots, M.D.DATE OF BIRTH:  Jul 21, 1971  DATE OF CONSULTATION:  06/12/2011 DATE OF DISCHARGE:                                CONSULTATION   HISTORY:  A 40 year old female, intrauterine pregnancy at 70 weeks' gestation, history of previous cesarean section presents with complaint of uterine contractions.  The patient was afebrile.  Vital signs were stable.  On cervical exam, the cervix was long and closed.  External fetal monitoring revealed mild uterine contractions.  The patient was monitored for a prolonged period and was found not to be in labor. Uterine contractions spaced out and the patient was therefore discharged home, not in labor, undelivered at 39 weeks' gestation in good condition.     Charles A. Clearance Coots, M.D.     CAH/MEDQ  D:  07/05/2011  T:  07/05/2011  Job:  [id number]

## 2011-08-13 NOTE — Progress Notes (Signed)
Subjective: POD# 1 s/p Cesarean Delivery.  Indications: elective  RH status/Rubella reviewed. Feeding: unknown Patient reports tolerating PO.  Denies HA/SOB/C/P/N/V/dizziness.  Reports flatus or BM. Breast symptoms: None  She reports vaginal bleeding as normal, without clots.  She is ambulating, urinating without difficulty.     Objective: Vital signs in last 24 hours: BP 126/73  Pulse 80  Temp(Src) 98.5 F (36.9 C) (Oral)  Resp 18  Wt 95.255 kg (210 lb)  SpO2 92%  LMP 09/02/2010  Breastfeeding? Unknown       Physical Exam:  General: alert CV: Regular rate and rhythm Resp: clear Abdomen: soft, nontender, normal bowel sounds Lochia: minimal Uterine Fundus: firm, below umbilicus, nontender Incision: clean, dry and intact Ext: extremities normal, atraumatic, no cyanosis or edema    Assessment/Plan: 40 y.o.  status post Cesarean section. POD# 1.   Doing well, stable.              Advance diet as tolerated Start po pain meds D/C foley  HLIV  Ambulate IS Routine post-op care  JACKSON-MOORE,Acie Custis A 08/13/2011, 6:03 PM

## 2011-09-26 ENCOUNTER — Emergency Department (HOSPITAL_COMMUNITY)
Admission: EM | Admit: 2011-09-26 | Discharge: 2011-09-26 | Disposition: A | Discharge status: Home / Self Care | Payer: Self-pay | Attending: Emergency Medicine | Admitting: Emergency Medicine

## 2011-09-26 DIAGNOSIS — R059 Cough, unspecified: Secondary | ICD-10-CM | POA: Insufficient documentation

## 2011-09-26 DIAGNOSIS — R0789 Other chest pain: Secondary | ICD-10-CM | POA: Insufficient documentation

## 2011-09-26 DIAGNOSIS — J45909 Unspecified asthma, uncomplicated: Secondary | ICD-10-CM | POA: Insufficient documentation

## 2011-09-26 DIAGNOSIS — R000 Tachycardia, unspecified: Secondary | ICD-10-CM | POA: Insufficient documentation

## 2011-09-26 DIAGNOSIS — R05 Cough: Secondary | ICD-10-CM | POA: Insufficient documentation

## 2013-04-06 ENCOUNTER — Ambulatory Visit (INDEPENDENT_AMBULATORY_CARE_PROVIDER_SITE_OTHER): Payer: BC Managed Care – PPO | Admitting: Internal Medicine

## 2013-04-06 ENCOUNTER — Encounter: Payer: Self-pay | Admitting: Internal Medicine

## 2013-04-06 VITALS — BP 130/80 | HR 76 | Temp 98.6°F | Resp 20 | Wt 168.0 lb

## 2013-04-06 DIAGNOSIS — S2096XA Insect bite (nonvenomous) of unspecified parts of thorax, initial encounter: Secondary | ICD-10-CM

## 2013-04-06 DIAGNOSIS — S30860A Insect bite (nonvenomous) of lower back and pelvis, initial encounter: Secondary | ICD-10-CM

## 2013-04-06 DIAGNOSIS — J45909 Unspecified asthma, uncomplicated: Secondary | ICD-10-CM

## 2013-04-06 DIAGNOSIS — W57XXXA Bitten or stung by nonvenomous insect and other nonvenomous arthropods, initial encounter: Secondary | ICD-10-CM

## 2013-04-06 MED ORDER — TRIAMCINOLONE ACETONIDE 0.1 % EX CREA: TOPICAL_CREAM | Freq: Two times a day (BID) | CUTANEOUS | Status: DC

## 2013-04-06 MED ORDER — MONTELUKAST SODIUM 10 MG PO TABS: 10.0000 mg | ORAL_TABLET | Freq: Every day | ORAL | Status: DC

## 2013-04-06 NOTE — Patient Instructions (Addendum)
Aerospan 2 puffs daily (along with Symbicort once daily)  Or 2 puffs twice daily (and d/c Symbicort)  Call or return to clinic prn if these symptoms worsen or fail to improve as anticipated.

## 2013-04-06 NOTE — Progress Notes (Signed)
Subjective:    Patient ID: Shelly [name], female    DOB: 06-May-1971, 42 y.o.   MRN: [id number]  HPI 42 year old patient who has chronic asthma. She states her asthma but has not been well-controlled. Medical regimen includes Symbicort but she is only able to take this daily Do to the stimulatory side effects of the bronchodilator. Her chief complaint today  Is redness and itching about a tick bite 2 days ago. She also has had a intermittent painful varicosity involving her left inner thigh just proximal to the knee. She has had 3 pregnancies   Past Medical History  Diagnosis Date  . GERD (gastroesophageal reflux disease)   . Depression   . Anxiety     History   Social History  . Marital Status: Married    Spouse Name: N/A    Number of Children: N/A  . Years of Education: N/A   Occupational History  . Not on file.   Social History Main Topics  . Smoking status: Former Games developer  . Smokeless tobacco: Not on file  . Alcohol Use: No  . Drug Use: No  . Sexually Active: Not Currently    Birth Control/ Protection: None   Other Topics Concern  . Not on file   Social History Narrative  . No narrative on file    Past Surgical History  Procedure Laterality Date  . C/s x2    . Arthoscopic knee      No family history on file.  No Known Allergies  Current Outpatient Prescriptions on File Prior to Visit  Medication Sig Dispense Refill  . albuterol (PROVENTIL HFA) 108 (90 BASE) MCG/ACT inhaler Inhale 1 puff into the lungs daily as needed.        . budesonide-formoterol (SYMBICORT) 160-4.5 MCG/ACT inhaler Inhale 1 puff into the lungs daily.        Marland Kitchen esomeprazole (NEXIUM) 40 MG capsule Take 40 mg by mouth daily.         No current facility-administered medications on file prior to visit.    BP 130/80  Pulse 76  Temp(Src) 98.6 F (37 C) (Oral)  Resp 20  Wt 168 lb (76.204 kg)  BMI 27.96 kg/m2  SpO2 96%  LMP 03/27/2013  Breastfeeding? No       Review of Systems   Constitutional: Negative.   HENT: Negative for hearing loss, congestion, sore throat, rhinorrhea, dental problem, sinus pressure and tinnitus.   Eyes: Negative for pain, discharge and visual disturbance.  Respiratory: Positive for cough and wheezing. Negative for shortness of breath.   Cardiovascular: Negative for chest pain, palpitations and leg swelling.  Gastrointestinal: Negative for nausea, vomiting, abdominal pain, diarrhea, constipation, blood in stool and abdominal distention.  Genitourinary: Negative for dysuria, urgency, frequency, hematuria, flank pain, vaginal bleeding, vaginal discharge, difficulty urinating, vaginal pain and pelvic pain.  Musculoskeletal: Negative for joint swelling, arthralgias and gait problem.  Skin: Positive for rash.  Neurological: Negative for dizziness, syncope, speech difficulty, weakness, numbness and headaches.  Hematological: Negative for adenopathy.  Psychiatric/Behavioral: Negative for behavioral problems, dysphoric mood and agitation. The patient is not nervous/anxious.        Objective:   Physical Exam  Constitutional: She is oriented to person, place, and time. She appears well-developed and well-nourished.  HENT:  Head: Normocephalic.  Right Ear: External ear normal.  Left Ear: External ear normal.  Mouth/Throat: Oropharynx is clear and moist.  Eyes: Conjunctivae and EOM are normal. Pupils are equal, round, and reactive to light.  Neck:  Normal range of motion. Neck supple. No thyromegaly present.  Cardiovascular: Normal rate, regular rhythm, normal heart sounds and intact distal pulses.   Pulmonary/Chest: Effort normal and breath sounds normal. No respiratory distress. She has no wheezes.  Abdominal: Soft. Bowel sounds are normal. She exhibits no mass. There is no tenderness.  Musculoskeletal: Normal range of motion.  Lymphadenopathy:    She has no cervical adenopathy.  Neurological: She is alert and oriented to person, place, and time.   Skin: Skin is warm and dry. Rash noted.  Patchy erythema of about 3 cm involving her left chest wall area  Psychiatric: She has a normal mood and affect. Her behavior is normal.          Assessment & Plan:  Tick  bite with secondary allergic reaction Asthma suboptimal control with inability to tolerate twice a day Symbicort. The patient will be given samples of a  Inhaled ICS which she will use twice daily  Will treat with triamcinolone for the local skin rash.

## 2013-06-13 ENCOUNTER — Other Ambulatory Visit: Payer: Self-pay

## 2013-06-13 DIAGNOSIS — Z1231 Encounter for screening mammogram for malignant neoplasm of breast: Secondary | ICD-10-CM

## 2013-06-15 ENCOUNTER — Encounter: Payer: Self-pay | Admitting: Internal Medicine

## 2013-06-15 ENCOUNTER — Ambulatory Visit (INDEPENDENT_AMBULATORY_CARE_PROVIDER_SITE_OTHER): Payer: BC Managed Care – PPO | Admitting: Internal Medicine

## 2013-06-15 VITALS — BP 112/80 | HR 83 | Resp 20 | Wt 171.0 lb

## 2013-06-15 DIAGNOSIS — J45909 Unspecified asthma, uncomplicated: Secondary | ICD-10-CM

## 2013-06-15 DIAGNOSIS — J309 Allergic rhinitis, unspecified: Secondary | ICD-10-CM

## 2013-06-15 MED ORDER — PREDNISONE 20 MG PO TABS: 20.0000 mg | ORAL_TABLET | Freq: Two times a day (BID) | ORAL | Status: DC

## 2013-06-15 MED ORDER — BUDESONIDE 180 MCG/ACT IN AEPB: 2.0000 | INHALATION_SPRAY | Freq: Two times a day (BID) | RESPIRATORY_TRACT | Status: DC

## 2013-06-15 NOTE — Patient Instructions (Addendum)
Prednisone twice daily for 1 week as discussed  Pulmicort 2 puffs twice daily (may substitute Symbicort 1 puff daily)  Return in 3 months for follow-up

## 2013-06-15 NOTE — Progress Notes (Signed)
Subjective:    Patient ID: Shelly [name], female    DOB: 05/26/1971, 42 y.o.   MRN: [id number]  HPI  42 year old patient who is seen today in followup. She has a history of allergic rhinitis and asthma.  She states that she has been about the same after being placed on a more intensive regimen of ICS. She states that she uses albuterol at least twice weekly. She has a congested cough that has been chronic. She has been intolerant of more than one inhalation of Symbicort daily She also describes some vertigo intermittently over the past couple of weeks.  Past Medical History  Diagnosis Date  . GERD (gastroesophageal reflux disease)   . Depression   . Anxiety     History   Social History  . Marital Status: Married    Spouse Name: N/A    Number of Children: N/A  . Years of Education: N/A   Occupational History  . Not on file.   Social History Main Topics  . Smoking status: Former Games developer  . Smokeless tobacco: Not on file  . Alcohol Use: No  . Drug Use: No  . Sexually Active: Not Currently    Birth Control/ Protection: None   Other Topics Concern  . Not on file   Social History Narrative  . No narrative on file    Past Surgical History  Procedure Laterality Date  . C/s x2    . Arthoscopic knee      No family history on file.  No Known Allergies  Current Outpatient Prescriptions on File Prior to Visit  Medication Sig Dispense Refill  . albuterol (PROVENTIL HFA) 108 (90 BASE) MCG/ACT inhaler Inhale 1 puff into the lungs daily as needed.        . budesonide-formoterol (SYMBICORT) 160-4.5 MCG/ACT inhaler Inhale 1 puff into the lungs daily.        . montelukast (SINGULAIR) 10 MG tablet Take 1 tablet (10 mg total) by mouth at bedtime.  30 tablet  3  . triamcinolone cream (KENALOG) 0.1 % Apply topically 2 (two) times daily.  30 g  0   No current facility-administered medications on file prior to visit.    BP 112/80  Pulse 83  Resp 20  Wt 171 lb (77.565 kg)  BMI  28.46 kg/m2  SpO2 95%  LMP 06/08/2013       Review of Systems  Constitutional: Negative.   HENT: Positive for congestion. Negative for hearing loss, sore throat, rhinorrhea, dental problem, sinus pressure and tinnitus.   Eyes: Negative for pain, discharge and visual disturbance.  Respiratory: Positive for cough, shortness of breath and wheezing.   Cardiovascular: Negative for chest pain, palpitations and leg swelling.  Gastrointestinal: Negative for nausea, vomiting, abdominal pain, diarrhea, constipation, blood in stool and abdominal distention.  Genitourinary: Negative for dysuria, urgency, frequency, hematuria, flank pain, vaginal bleeding, vaginal discharge, difficulty urinating, vaginal pain and pelvic pain.  Musculoskeletal: Negative for joint swelling, arthralgias and gait problem.  Skin: Negative for rash.  Neurological: Positive for dizziness. Negative for syncope, speech difficulty, weakness, numbness and headaches.  Hematological: Negative for adenopathy.  Psychiatric/Behavioral: Negative for behavioral problems, dysphoric mood and agitation. The patient is not nervous/anxious.        Objective:   Physical Exam  Constitutional: She is oriented to person, place, and time. She appears well-developed and well-nourished.  HENT:  Head: Normocephalic.  Right Ear: External ear normal.  Left Ear: External ear normal.  Mouth/Throat: Oropharynx is clear and moist.  Eyes: Conjunctivae and EOM are normal. Pupils are equal, round, and reactive to light.  Neck: Normal range of motion. Neck supple. No thyromegaly present.  Cardiovascular: Normal rate, regular rhythm, normal heart sounds and intact distal pulses.   Pulmonary/Chest: Effort normal and breath sounds normal. No respiratory distress. She has no wheezes. She has no rales.  O2 saturation 95%  Abdominal: Soft. Bowel sounds are normal. She exhibits no mass. There is no tenderness.  Musculoskeletal: Normal range of motion.   Lymphadenopathy:    She has no cervical adenopathy.  Neurological: She is alert and oriented to person, place, and time.  Skin: Skin is warm and dry. No rash noted.  Psychiatric: She has a normal mood and affect. Her behavior is normal.          Assessment & Plan:   Chronic asthma. Suboptimal control. We'll place on Pulmicort 2 inhalations twice a day We'll give a trial of oral prednisone to see if asthma is responsive. We'll consider pulmonary/allergy referral depending on her clinical response

## 2013-06-26 ENCOUNTER — Ambulatory Visit
Admission: RE | Admit: 2013-06-26 | Discharge: 2013-06-26 | Disposition: A | Discharge status: Home / Self Care | Payer: BC Managed Care – PPO | Source: Ambulatory Visit

## 2013-06-26 DIAGNOSIS — Z1231 Encounter for screening mammogram for malignant neoplasm of breast: Secondary | ICD-10-CM

## 2013-06-28 ENCOUNTER — Other Ambulatory Visit: Payer: Self-pay | Admitting: Internal Medicine

## 2013-06-28 ENCOUNTER — Other Ambulatory Visit: Payer: Self-pay | Admitting: *Deleted

## 2013-06-28 DIAGNOSIS — R928 Other abnormal and inconclusive findings on diagnostic imaging of breast: Secondary | ICD-10-CM

## 2013-06-28 MED ORDER — FLUCONAZOLE 100 MG PO TABS: 200.0000 mg | ORAL_TABLET | ORAL | Status: DC

## 2013-07-12 ENCOUNTER — Ambulatory Visit
Admission: RE | Admit: 2013-07-12 | Discharge: 2013-07-12 | Disposition: A | Discharge status: Home / Self Care | Payer: BC Managed Care – PPO | Source: Ambulatory Visit | Attending: Internal Medicine | Admitting: Internal Medicine

## 2013-07-12 DIAGNOSIS — R928 Other abnormal and inconclusive findings on diagnostic imaging of breast: Secondary | ICD-10-CM

## 2013-09-20 ENCOUNTER — Ambulatory Visit (INDEPENDENT_AMBULATORY_CARE_PROVIDER_SITE_OTHER): Payer: BC Managed Care – PPO | Admitting: Obstetrics & Gynecology

## 2013-09-20 ENCOUNTER — Encounter: Payer: Self-pay | Admitting: Obstetrics & Gynecology

## 2013-09-20 VITALS — BP 139/84 | HR 90 | Temp 99.2°F | Ht 65.0 in | Wt 171.0 lb

## 2013-09-20 DIAGNOSIS — Z01419 Encounter for gynecological examination (general) (routine) without abnormal findings: Secondary | ICD-10-CM

## 2013-09-20 DIAGNOSIS — N926 Irregular menstruation, unspecified: Secondary | ICD-10-CM

## 2013-09-20 LAB — COMPREHENSIVE METABOLIC PANEL
ALT: 14 U/L (ref 0–35)
CO2: 25 mEq/L (ref 19–32)
Calcium: 9.2 mg/dL (ref 8.4–10.5)
Chloride: 102 mEq/L (ref 96–112)
Creat: 0.85 mg/dL (ref 0.50–1.10)
Glucose, Bld: 95 mg/dL (ref 70–99)
Sodium: 139 mEq/L (ref 135–145)
Total Protein: 7.2 g/dL (ref 6.0–8.3)

## 2013-09-20 LAB — TSH: TSH: 1.102 u[IU]/mL (ref 0.350–4.500)

## 2013-09-20 LAB — CBC
Hemoglobin: 12.7 g/dL (ref 12.0–15.0)
MCH: 28.6 pg (ref 26.0–34.0)
MCV: 86.5 fL (ref 78.0–100.0)
Platelets: 268 10*3/uL (ref 150–400)
RBC: 4.44 MIL/uL (ref 3.87–5.11)
WBC: 9.9 10*3/uL (ref 4.0–10.5)

## 2013-09-20 NOTE — Progress Notes (Signed)
Subjective:     Shelly [name] is a 42 y.o. female here for a routine exam.  Current complaints: Patient is in the office today for annual exam- patient reports cycles can be long or short- ? About menopause.  Personal health questionnaire reviewed: no.   Gynecologic History Patient's last menstrual period was 08/28/2013. Contraception: tubal ligation Last Pap: 1 year +. Results were: normal Last mammogram: last month. Results were: normal  Obstetric History OB History  Gravida Para Term Preterm AB SAB TAB Ectopic Multiple Living  6 2 2  1 1         # Outcome Date GA Lbr Len/2nd Weight Sex Delivery Anes PTL Lv  6 SAB           5 TRM           4 TRM           3 GRA              Comments: System Generated. Please review and update pregnancy details.  2 GRA           1 GRA                The following portions of the patient's history were reviewed and updated as appropriate: allergies, current medications, past family history, past medical history, past social history, past surgical history and problem list.  Review of Systems Pertinent items are noted in HPI.    Objective:      General appearance: alert Breasts: normal appearance, no masses or tenderness Abdomen: soft, non-tender; bowel sounds normal; no masses,  no organomegaly Pelvic: cervix normal in appearance, external genitalia normal, no adnexal masses or tenderness, uterus normal size, shape, and consistency and vagina normal without discharge    Assessment:    AUB  Plan:  Return prn    Orders Placed This Encounter  Procedures  . CBC  . Comprehensive metabolic panel  . TSH  . Prolactin  . POCT urine pregnancy

## 2013-09-21 ENCOUNTER — Encounter: Payer: Self-pay | Admitting: Obstetrics & Gynecology

## 2013-09-21 ENCOUNTER — Ambulatory Visit (INDEPENDENT_AMBULATORY_CARE_PROVIDER_SITE_OTHER): Payer: BC Managed Care – PPO | Admitting: Internal Medicine

## 2013-09-21 ENCOUNTER — Encounter: Payer: Self-pay | Admitting: Internal Medicine

## 2013-09-21 VITALS — BP 120/70 | HR 95 | Temp 98.0°F | Resp 20 | Wt 169.0 lb

## 2013-09-21 DIAGNOSIS — J45909 Unspecified asthma, uncomplicated: Secondary | ICD-10-CM

## 2013-09-21 DIAGNOSIS — J309 Allergic rhinitis, unspecified: Secondary | ICD-10-CM

## 2013-09-21 LAB — PROLACTIN: Prolactin: 6.3 ng/mL

## 2013-09-21 MED ORDER — FLUNISOLIDE HFA 80 MCG/ACT IN AERS: 2.0000 | INHALATION_SPRAY | Freq: Two times a day (BID) | RESPIRATORY_TRACT | Status: DC

## 2013-09-21 MED ORDER — MONTELUKAST SODIUM 10 MG PO TABS: 10.0000 mg | ORAL_TABLET | Freq: Every day | ORAL | Status: DC

## 2013-09-21 NOTE — Patient Instructions (Signed)

## 2013-09-21 NOTE — Patient Instructions (Signed)
Asthma Prevention  Cigarette smoke, house dust, molds, pollens, animal dander, certain insects, exercise, and even cold air are all triggers that can cause an asthma attack. Often, no specific triggers are identified.   Take the following measures around your house to reduce attacks:  · Avoid cigarette and other smoke. No smoking should be allowed in a home where someone with asthma lives. If smoking is allowed indoors, it should be done in a room with a closed door, and a window should be opened to clear the air. If possible, do not use a wood-burning stove, kerosene heater, or fireplace. Minimize exposure to all sources of smoke, including incense, candles, fires, and fireworks.  · Decrease pollen exposure. Keep your windows shut and use central air during the pollen allergy season. Stay indoors with windows closed from late morning to afternoon, if you can. Avoid mowing the lawn if you have grass pollen allergy. Change your clothes and shower after being outside during this time of year.  · Remove molds from bathrooms and wet areas. Do this by cleaning the floors with a fungicide or diluted bleach. Avoid using humidifiers, vaporizers, or swamp coolers. These can spread molds through the air. Fix leaky faucets, pipes, or other sources of water that have mold around them.  · Decrease house dust exposure. Do this by using bare floors, vacuuming frequently, and changing furnace and air cooler filters frequently. Avoid using feather, wool, or foam bedding. Use polyester pillows and plastic covers over your mattress. Wash bedding weekly in hot water (hotter than 130° F).  · Try to get someone else to vacuum for you once or twice a week, if you can. Stay out of rooms while they are being vacuumed and for a short while afterward. If you vacuum, use a dust mask (from a hardware store), a double-layered or microfilter vacuum cleaner bag, or a vacuum cleaner with a HEPA filter.  · Avoid perfumes, talcum powder, hair spray,  paints and other strong odors and fumes.  · Keep warm-blooded pets (cats, dogs, rodents, birds) outside the home if they are triggers for asthma. If you can't keep the pet outdoors, keep the pet out of your bedroom and other sleeping areas at all times, and keep the door closed. Remove carpets and furniture covered with cloth from your home. If that is not possible, keep the pet away from fabric-covered furniture and carpets.  · Eliminate cockroaches. Keep food and garbage in closed containers. Never leave food out. Use poison baits, traps, powders, gels, or paste (for example, boric acid). If a spray is used to kill cockroaches, stay out of the room until the odor goes away.  · Decrease indoor humidity to less than 60%. Use an indoor air cleaning device.  · Avoid sulfites in foods and beverages. Do not drink beer or wine or eat dried fruit, processed potatoes, or shrimp if they cause asthma symptoms.  · Avoid cold air. Cover your nose and mouth with a scarf on cold or windy days.  · Avoid aspirin. This is the most common drug causing serious asthma attacks.  · If exercise triggers your asthma, ask your caregiver how you should prepare before exercising. (For example, ask if you could use your inhaler 10 minutes before exercising.)  · Avoid close contact with people who have a cold or the flu since your asthma symptoms may get worse if you catch the infection from them. Wash your hands thoroughly after touching items that may have been handled by   others with a respiratory infection.  · Get a flu shot every year to protect against the flu virus, which often makes asthma worse for days to weeks. Also get a pneumonia shot once every five to 10 years.  Call your caregiver if you want further information about measures you can take to help prevent asthma attacks.  Document Released: 11/09/2005 Document Revised: 02/01/2012 Document Reviewed: 09/17/2009  ExitCare® Patient Information ©2014 [employer].

## 2013-09-21 NOTE — Progress Notes (Signed)
Subjective:    Patient ID: Shelly [name], female    DOB: 1971-09-17, 42 y.o.   MRN: [id number]  HPI  42 year old patient who has chronic asthma. She has fairly chronic cough and chronic congestion and occasional wheezing. She has had a difficult time obtaining ICS medications due to cost concerns and has been given samples.  She has a young daughter who stays at day care and often has URI symptoms. She has multiple dogs and cats and lives in an older house that she feels is probably a factor with her asthma symptoms  Past Medical History  Diagnosis Date  . GERD (gastroesophageal reflux disease)   . Depression   . Anxiety     History   Social History  . Marital Status: Married    Spouse Name: N/A    Number of Children: N/A  . Years of Education: N/A   Occupational History  . Not on file.   Social History Main Topics  . Smoking status: Former Games developer  . Smokeless tobacco: Not on file  . Alcohol Use: Yes     Comment: occasional  . Drug Use: No  . Sexual Activity: Yes    Partners: Male    Birth Control/ Protection: Surgical   Other Topics Concern  . Not on file   Social History Narrative  . No narrative on file    Past Surgical History  Procedure Laterality Date  . C/s x2    . Arthoscopic knee      Family History  Problem Relation Age of Onset  . COPD Father     No Known Allergies  Current Outpatient Prescriptions on File Prior to Visit  Medication Sig Dispense Refill  . albuterol (PROVENTIL HFA) 108 (90 BASE) MCG/ACT inhaler Inhale 1 puff into the lungs daily as needed.        . budesonide (PULMICORT) 180 MCG/ACT inhaler Inhale 2 puffs into the lungs 2 (two) times daily.  1 Inhaler  6  . budesonide-formoterol (SYMBICORT) 160-4.5 MCG/ACT inhaler Inhale 1 puff into the lungs daily.        Marland Kitchen triamcinolone cream (KENALOG) 0.1 % Apply topically 2 (two) times daily.  30 g  0   No current facility-administered medications on file prior to visit.    BP 120/70   Pulse 95  Temp(Src) 98 F (36.7 C) (Oral)  Resp 20  Wt 169 lb (76.658 kg)  BMI 28.12 kg/m2  SpO2 96%  LMP 08/28/2013      Review of Systems  Constitutional: Negative.   HENT: Positive for postnasal drip. Negative for congestion, dental problem, hearing loss, rhinorrhea, sinus pressure, sore throat and tinnitus.   Eyes: Negative for pain, discharge and visual disturbance.  Respiratory: Positive for cough and wheezing. Negative for shortness of breath.   Cardiovascular: Negative for chest pain, palpitations and leg swelling.  Gastrointestinal: Negative for nausea, vomiting, abdominal pain, diarrhea, constipation, blood in stool and abdominal distention.  Genitourinary: Negative for dysuria, urgency, frequency, hematuria, flank pain, vaginal bleeding, vaginal discharge, difficulty urinating, vaginal pain and pelvic pain.  Musculoskeletal: Negative for arthralgias, gait problem and joint swelling.  Skin: Negative for rash.  Neurological: Negative for dizziness, syncope, speech difficulty, weakness, numbness and headaches.  Hematological: Negative for adenopathy.  Psychiatric/Behavioral: Negative for behavioral problems, dysphoric mood and agitation. The patient is not nervous/anxious.        Objective:   Physical Exam  Constitutional: She is oriented to person, place, and time. She appears well-developed and well-nourished.  HENT:  Head: Normocephalic.  Right Ear: External ear normal.  Left Ear: External ear normal.  Mouth/Throat: Oropharynx is clear and moist.  Eyes: Conjunctivae and EOM are normal. Pupils are equal, round, and reactive to light.  Neck: Normal range of motion. Neck supple. No thyromegaly present.  Cardiovascular: Normal rate, regular rhythm, normal heart sounds and intact distal pulses.   Pulmonary/Chest: Effort normal and breath sounds normal. No respiratory distress. She has no wheezes. She has no rales. She exhibits no tenderness.  Abdominal: Soft. Bowel  sounds are normal. She exhibits no mass. There is no tenderness.  Musculoskeletal: Normal range of motion.  Lymphadenopathy:    She has no cervical adenopathy.  Neurological: She is alert and oriented to person, place, and time.  Skin: Skin is warm and dry. No rash noted.  Psychiatric: She has a normal mood and affect. Her behavior is normal.          Assessment & Plan:   Chronic Asthma  With multiple triggers Samples of ICS dispensed Will allergy referral

## 2013-09-22 LAB — PAP IG, CT-NG, RFX HPV ASCU
Chlamydia Probe Amp: NEGATIVE
GC Probe Amp: NEGATIVE

## 2013-10-04 ENCOUNTER — Ambulatory Visit: Payer: BC Managed Care – PPO | Admitting: Obstetrics & Gynecology

## 2013-10-04 ENCOUNTER — Other Ambulatory Visit: Payer: BC Managed Care – PPO

## 2013-10-09 ENCOUNTER — Other Ambulatory Visit: Payer: Self-pay | Admitting: *Deleted

## 2013-10-09 DIAGNOSIS — N926 Irregular menstruation, unspecified: Secondary | ICD-10-CM

## 2013-10-11 ENCOUNTER — Ambulatory Visit (INDEPENDENT_AMBULATORY_CARE_PROVIDER_SITE_OTHER): Payer: BC Managed Care – PPO | Admitting: Obstetrics & Gynecology

## 2013-10-11 ENCOUNTER — Ambulatory Visit (INDEPENDENT_AMBULATORY_CARE_PROVIDER_SITE_OTHER): Payer: BC Managed Care – PPO

## 2013-10-11 ENCOUNTER — Encounter: Payer: Self-pay | Admitting: Obstetrics & Gynecology

## 2013-10-11 ENCOUNTER — Other Ambulatory Visit: Payer: Self-pay

## 2013-10-11 VITALS — BP 103/67 | HR 79 | Temp 98.4°F | Ht 65.0 in | Wt 172.0 lb

## 2013-10-11 DIAGNOSIS — N938 Other specified abnormal uterine and vaginal bleeding: Secondary | ICD-10-CM

## 2013-10-11 DIAGNOSIS — N946 Dysmenorrhea, unspecified: Secondary | ICD-10-CM

## 2013-10-11 DIAGNOSIS — N926 Irregular menstruation, unspecified: Secondary | ICD-10-CM

## 2013-10-11 DIAGNOSIS — N840 Polyp of corpus uteri: Secondary | ICD-10-CM | POA: Insufficient documentation

## 2013-10-11 DIAGNOSIS — N949 Unspecified condition associated with female genital organs and menstrual cycle: Secondary | ICD-10-CM

## 2013-10-11 NOTE — Patient Instructions (Signed)
Hysteroscopy Hysteroscopy is a procedure used for looking inside the womb (uterus). It may be done for many different reasons, including:  To evaluate abnormal bleeding, fibroid (benign, noncancerous) tumors, polyps, scar tissue (adhesions), and possibly cancer of the uterus.  To look for lumps (tumors) and other uterine growths.  To look for causes of why a woman cannot get pregnant (infertility), causes of recurrent loss of pregnancy (miscarriages), or a lost intrauterine device (IUD).  To perform a sterilization by blocking the fallopian tubes from inside the uterus. A hysteroscopy should be done right after a menstrual period to be sure you are not pregnant. LET YOUR CAREGIVER KNOW ABOUT:   Allergies.  Medicines taken, including herbs, eyedrops, over-the-counter medicines, and creams.  Use of steroids (by mouth or creams).  Previous problems with anesthetics or numbing medicines.  History of bleeding or blood problems.  History of blood clots.  Possibility of pregnancy, if this applies.  Previous surgery.  Other health problems. RISKS AND COMPLICATIONS   Putting a hole in the uterus.  Excessive bleeding.  Infection.  Damage to the cervix.  Injury to other organs.  Allergic reaction to medicines.  Too much fluid used in the uterus for the procedure. BEFORE THE PROCEDURE   Do not take aspirin or blood thinners for a week before the procedure, or as directed. It can cause bleeding.  Arrive at least 60 minutes before the procedure or as directed to read and sign the necessary forms.  Arrange for someone to take you home after the procedure.  If you smoke, do not smoke for 2 weeks before the procedure. PROCEDURE   Your caregiver may give you medicine to relax you. He or she may also give you a medicine that numbs the area around the cervix (local anesthetic) or a medicine that makes you sleep (general anesthesia).  Sometimes, a medicine is placed in the  cervix the day before the procedure. This medicine makes the cervix have a larger opening (dilate). This makes it easier for the instrument to be inserted into the uterus.  A small instrument (hysteroscope) is inserted through the vagina into the uterus. This instrument is similar to a pencil-sized telescope with a light.  During the procedure, air or a liquid is put into the uterus, which allows the surgeon to see better.  Sometimes, tissue is gently scraped from inside the uterus. These tissue samples are sent to a specialist who looks at tissue samples (pathologist). The pathologist will give a report to your caregiver. This will help your caregiver decide if further treatment is necessary. The report will also help your caregiver decide on the best treatment if the test comes back abnormal. AFTER THE PROCEDURE   If you had a general anesthetic, you may be groggy for a couple hours after the procedure.  If you had a local anesthetic, you will be advised to rest at the surgical center or caregiver's office until you are stable and feel ready to go home.  You may have some cramping for a couple days.  You may have bleeding, which varies from light spotting for a few days to menstrual-like bleeding for up to 3 to 7 days. This is normal.  Have someone take you home. FINDING OUT THE RESULTS OF YOUR TEST Not all test results are available during your visit. If your test results are not back during the visit, make an appointment with your caregiver to find out the results. Do not assume everything is normal if you   have not heard from your caregiver or the medical facility. It is important for you to follow up on all of your test results. HOME CARE INSTRUCTIONS   Do not drive for 24 hours or as instructed.  Only take over-the-counter or prescription medicines for pain, discomfort, or fever as directed by your caregiver.  Do not take aspirin. It can cause or aggravate bleeding.  Do not drive or  drink alcohol while taking pain medicine.  You may resume your usual diet.  Do not use tampons, douche, or have sexual intercourse for 2 weeks, or as advised by your caregiver.  Rest and sleep for the first 24 to 48 hours.  Take your temperature twice a day for 4 to 5 days. Write it down. Give these temperatures to your caregiver if they are abnormal (above 98.6 F or 37.0 C).  Take medicines your caregiver has ordered as directed.  Follow your caregiver's advice regarding diet, exercise, lifting, driving, and general activities.  Take showers instead of baths for 2 weeks, or as recommended by your caregiver.  If you develop constipation:  Take a mild laxative with the advice of your caregiver.  Eat bran foods.  Drink enough water and fluids to keep your urine clear or pale yellow.  Try to have someone with you or available to you for the first 24 to 48 hours, especially if you had a general anesthetic.  Make sure you and your family understand everything about your operation and recovery.  Follow your caregiver's advice regarding follow-up appointments and Pap smears. SEEK MEDICAL CARE IF:   You feel dizzy or lightheaded.  You feel sick to your stomach (nauseous).  You develop abnormal vaginal discharge.  You develop a rash.  You have an abnormal reaction or allergy to your medicine.  You need stronger pain medicine. SEEK IMMEDIATE MEDICAL CARE IF:   Bleeding is heavier than a normal menstrual period or you have blood clots.  You have an oral temperature above 102 F (38.9 C), not controlled by medicine.  You have increasing cramps or pains not relieved with medicine.  You develop belly (abdominal) pain that does not seem to be related to the same area of earlier cramping and pain.  You pass out.  You develop pain in the tops of your shoulders (shoulder strap areas).  You develop shortness of breath. MAKE SURE YOU:   Understand these instructions.  Will  watch your condition.  Will get help right away if you are not doing well or get worse. Document Released: 02/15/2001 Document Revised: 02/01/2012 Document Reviewed: 06/08/2013 Exit[employer] Patient Information 2014 [employer].  

## 2013-10-11 NOTE — Progress Notes (Signed)
SONOHYSTEROGRAM PROCEDURE NOTE  SONOHYSTEROGRAM PROCEDURE:   A time-out was performed confirming patient name, allergy status and procedure.  A UPT was negative.  A Graves speculum was placed in the vagina.  The cervix was prepped with betadine.  The cervix was grasped with a single tooth tenaculum.  The IUI catherter was primed with sterile water.  The IUI was introduced into the uterine cavity.  The single tooth tenaculum and speculum were removed.  The vaginal probe was placed.  The sterile water was instilled in the uterine cavity.    The endometrium was sampled prior to the instillation of the sterile water.  The uterus sounded to 7 - 8 cm. The IUI was removed.  The single tooth tenaculum was removed with minimal bleeding noted from the cervix.  The patient tolerated the procedure well.  FINDINGS:  1. 8 cm polyp; blood clot; no other intracavitary lesions

## 2013-10-31 ENCOUNTER — Other Ambulatory Visit: Payer: Self-pay | Admitting: *Deleted

## 2013-12-01 ENCOUNTER — Encounter (HOSPITAL_COMMUNITY): Admission: RE | Payer: Self-pay | Source: Ambulatory Visit

## 2013-12-01 ENCOUNTER — Ambulatory Visit (HOSPITAL_COMMUNITY)
Admission: RE | Admit: 2013-12-01 | Payer: BC Managed Care – PPO | Source: Ambulatory Visit | Admitting: Obstetrics & Gynecology

## 2013-12-01 SURGERY — DILATATION & CURETTAGE/HYSTEROSCOPY WITH TRUCLEAR
Anesthesia: Choice

## 2013-12-13 ENCOUNTER — Encounter: Payer: Self-pay | Admitting: Obstetrics & Gynecology

## 2014-03-27 NOTE — Unmapped (Signed)
Spoke with Michelle Rogers and confirmed mailing address. Verbalized instructions and patient verbally acknowledged understanding.

## 2014-04-23 NOTE — Unmapped (Signed)
Recipient received DDT.  Donor file closed.

## 2014-06-06 ENCOUNTER — Encounter: Payer: Self-pay | Admitting: Internal Medicine

## 2014-06-06 ENCOUNTER — Ambulatory Visit (INDEPENDENT_AMBULATORY_CARE_PROVIDER_SITE_OTHER): Payer: BC Managed Care – PPO | Admitting: Internal Medicine

## 2014-06-06 VITALS — BP 100/64 | HR 78 | Temp 98.2°F | Resp 20 | Ht 65.0 in | Wt 178.0 lb

## 2014-06-06 DIAGNOSIS — J45909 Unspecified asthma, uncomplicated: Secondary | ICD-10-CM

## 2014-06-06 MED ORDER — MONTELUKAST SODIUM 10 MG PO TABS: 10.0000 mg | ORAL_TABLET | Freq: Every day | ORAL | Status: DC

## 2014-06-06 MED ORDER — ALBUTEROL SULFATE HFA 108 (90 BASE) MCG/ACT IN AERS: 1.0000 | INHALATION_SPRAY | Freq: Every day | RESPIRATORY_TRACT | Status: DC | PRN

## 2014-06-06 MED ORDER — PREDNISONE 20 MG PO TABS: 20.0000 mg | ORAL_TABLET | Freq: Two times a day (BID) | ORAL | Status: DC

## 2014-06-06 NOTE — Progress Notes (Signed)
Subjective:    Patient ID: Shelly [name], female    DOB: 06/27/1971, 43 y.o.   MRN: [id number]  HPI 43 year old patient who is seen today for followup of chronic asthma.  She was seen in October and treated with a brief course of prednisone do to chronic cough and states that the cough resolved and her asthma was better controlled than it has been in some time.  She has had some mild chronic cough, but this has worsened over the past month.  She complains of chronic chest tightness, and she states that she uses albuterol approximately 5 times per day.  She has had a difficult time affording ICS and samples have been provided.  She is training for a triathlon. She does have an indoor dog, and outdoor cats  Past Medical History  Diagnosis Date  . GERD (gastroesophageal reflux disease)   . Depression   . Anxiety     History   Social History  . Marital Status: Married    Spouse Name: N/A    Number of Children: N/A  . Years of Education: N/A   Occupational History  . Not on file.   Social History Main Topics  . Smoking status: Former Games developer  . Smokeless tobacco: Not on file  . Alcohol Use: Yes     Comment: occasional  . Drug Use: No  . Sexual Activity: Yes    Partners: Male    Birth Control/ Protection: Surgical   Other Topics Concern  . Not on file   Social History Narrative  . No narrative on file    Past Surgical History  Procedure Laterality Date  . C/s x2    . Arthoscopic knee    . Cesarean section    . Wisdom tooth extraction Bilateral     Family History  Problem Relation Age of Onset  . COPD Father   . Emphysema Father   . Arthritis Mother   . Alcohol abuse Paternal Grandmother   . Cancer Paternal Grandmother     No Known Allergies  Current Outpatient Prescriptions on File Prior to Visit  Medication Sig Dispense Refill  . albuterol (PROVENTIL HFA) 108 (90 BASE) MCG/ACT inhaler Inhale 1 puff into the lungs daily as needed.        .  budesonide-formoterol (SYMBICORT) 160-4.5 MCG/ACT inhaler Inhale 1 puff into the lungs daily.        . montelukast (SINGULAIR) 10 MG tablet Take 1 tablet (10 mg total) by mouth at bedtime.  90 tablet  3   No current facility-administered medications on file prior to visit.    BP 100/64  Pulse 78  Temp(Src) 98.2 F (36.8 C) (Oral)  Resp 20  Ht 5\' 5"  (1.651 m)  Wt 178 lb (80.74 kg)  BMI 29.62 kg/m2  SpO2 97%  LMP 05/30/2014      Review of Systems  Constitutional: Negative.   HENT: Negative for congestion, dental problem, hearing loss, rhinorrhea, sinus pressure, sore throat and tinnitus.   Eyes: Negative for pain, discharge and visual disturbance.  Respiratory: Positive for cough, chest tightness and wheezing. Negative for shortness of breath.   Cardiovascular: Negative for chest pain, palpitations and leg swelling.  Gastrointestinal: Negative for nausea, vomiting, abdominal pain, diarrhea, constipation, blood in stool and abdominal distention.  Genitourinary: Negative for dysuria, urgency, frequency, hematuria, flank pain, vaginal bleeding, vaginal discharge, difficulty urinating, vaginal pain and pelvic pain.  Musculoskeletal: Negative for arthralgias, gait problem and joint swelling.  Skin: Negative for rash.  Neurological: Negative for dizziness, syncope, speech difficulty, weakness, numbness and headaches.  Hematological: Negative for adenopathy.  Psychiatric/Behavioral: Negative for behavioral problems, dysphoric mood and agitation. The patient is not nervous/anxious.        Objective:   Physical Exam  Constitutional: She is oriented to person, place, and time. She appears well-developed and well-nourished.  HENT:  Head: Normocephalic.  Right Ear: External ear normal.  Left Ear: External ear normal.  Mouth/Throat: Oropharynx is clear and moist.  Eyes: Conjunctivae and EOM are normal. Pupils are equal, round, and reactive to light.  Neck: Normal range of motion. Neck  supple. No thyromegaly present.  Cardiovascular: Normal rate, regular rhythm, normal heart sounds and intact distal pulses.   Pulmonary/Chest: Effort normal and breath sounds normal. No respiratory distress. She has no wheezes. She has no rales.  Abdominal: Soft. Bowel sounds are normal. She exhibits no mass. There is no tenderness.  Musculoskeletal: Normal range of motion.  Lymphadenopathy:    She has no cervical adenopathy.  Neurological: She is alert and oriented to person, place, and time.  Skin: Skin is warm and dry. No rash noted.  Psychiatric: She has a normal mood and affect. Her behavior is normal.          Assessment & Plan:  Chronic asthma.  Suboptimal control.  We'll add Singulair, and treated with a 7 day course of oral prednisone.  She will check with insurance.  The cheapest option for ICS. Will consider referral to allergy medicine

## 2014-06-06 NOTE — Patient Instructions (Signed)
Call or return to clinic prn if these symptoms worsen or fail to improve as anticipated.  Consider allergy referralAsthma Attack Prevention Although there is no way to prevent asthma from starting, you can take steps to control the disease and reduce its symptoms. Learn about your asthma and how to control it. Take an active role to control your asthma by working with your health care provider to create and follow an asthma action plan. An asthma action plan guides you in:  Taking your medicines properly.  Avoiding things that set off your asthma or make your asthma worse (asthma triggers).  Tracking your level of asthma control.  Responding to worsening asthma.  Seeking emergency care when needed. To track your asthma, keep records of your symptoms, check your peak flow number using a handheld device that shows how well air moves out of your lungs (peak flow meter), and get regular asthma checkups.  WHAT ARE SOME WAYS TO PREVENT AN ASTHMA ATTACK?  Take medicines as directed by your health care provider.  Keep track of your asthma symptoms and level of control.  With your health care provider, write a detailed plan for taking medicines and managing an asthma attack. Then be sure to follow your action plan. Asthma is an ongoing condition that needs regular monitoring and treatment.  Identify and avoid asthma triggers. Many outdoor allergens and irritants (such as pollen, mold, cold air, and air pollution) can trigger asthma attacks. Find out what your asthma triggers are and take steps to avoid them.  Monitor your breathing. Learn to recognize warning signs of an attack, such as coughing, wheezing, or shortness of breath. Your lung function may decrease before you notice any signs or symptoms, so regularly measure and record your peak airflow with a home peak flow meter.  Identify and treat attacks early. If you act quickly, you are less likely to have a severe attack. You will also need  less medicine to control your symptoms. When your peak flow measurements decrease and alert you to an upcoming attack, take your medicine as instructed and immediately stop any activity that may have triggered the attack. If your symptoms do not improve, get medical help.  Pay attention to increasing quick-relief inhaler use. If you find yourself relying on your quick-relief inhaler, your asthma is not under control. See your health care provider about adjusting your treatment. WHAT CAN MAKE MY SYMPTOMS WORSE? A number of common things can set off or make your asthma symptoms worse and cause temporary increased inflammation of your airways. Keep track of your asthma symptoms for several weeks, detailing all the environmental and emotional factors that are linked with your asthma. When you have an asthma attack, go back to your asthma diary to see which factor, or combination of factors, might have contributed to it. Once you know what these factors are, you can take steps to control many of them. If you have allergies and asthma, it is important to take asthma prevention steps at home. Minimizing contact with the substance to which you are allergic will help prevent an asthma attack. Some triggers and ways to avoid these triggers are: Animal Dander:  Some people are allergic to the flakes of skin or dried saliva from animals with fur or feathers.   There is no such thing as a hypoallergenic dog or cat breed. All dogs or cats can cause allergies, even if they don't shed.  Keep these pets out of your home.  If you are not  able to keep a pet outdoors, keep the pet out of your bedroom and other sleeping areas at all times, and keep the door closed.  Remove carpets and furniture covered with cloth from your home. If that is not possible, keep the pet away from fabric-covered furniture and carpets. Dust Mites: Many people with asthma are allergic to dust mites. Dust mites are tiny bugs that are found in  every home in mattresses, pillows, carpets, fabric-covered furniture, bedcovers, clothes, stuffed toys, and other fabric-covered items.   Cover your mattress in a special dust-proof cover.  Cover your pillow in a special dust-proof cover, or wash the pillow each week in hot water. Water must be hotter than 130 F (54.4 C) to kill dust mites. Cold or warm water used with detergent and bleach can also be effective.  Wash the sheets and blankets on your bed each week in hot water.  Try not to sleep or lie on cloth-covered cushions.  Call ahead when traveling and ask for a smoke-free hotel room. Bring your own bedding and pillows in case the hotel only supplies feather pillows and down comforters, which may contain dust mites and cause asthma symptoms.  Remove carpets from your bedroom and those laid on concrete, if you can.  Keep stuffed toys out of the bed, or wash the toys weekly in hot water or cooler water with detergent and bleach. Cockroaches: Many people with asthma are allergic to the droppings and remains of cockroaches.   Keep food and garbage in closed containers. Never leave food out.  Use poison baits, traps, powders, gels, or paste (for example, boric acid).  If a spray is used to kill cockroaches, stay out of the room until the odor goes away. Indoor Mold:  Fix leaky faucets, pipes, or other sources of water that have mold around them.  Clean floors and moldy surfaces with a fungicide or diluted bleach.  Avoid using humidifiers, vaporizers, or swamp coolers. These can spread molds through the air. Pollen and Outdoor Mold:  When pollen or mold spore counts are high, try to keep your windows closed.  Stay indoors with windows closed from late morning to afternoon. Pollen and some mold spore counts are highest at that time.  Ask your health care provider whether you need to take anti-inflammatory medicine or increase your dose of the medicine before your allergy season  starts. Other Irritants to Avoid:  Tobacco smoke is an irritant. If you smoke, ask your health care provider how you can quit. Ask family members to quit smoking, too. Do not allow smoking in your home or car.  If possible, do not use a wood-burning stove, kerosene heater, or fireplace. Minimize exposure to all sources of smoke, including incense, candles, fires, and fireworks.  Try to stay away from strong odors and sprays, such as perfume, talcum powder, hair spray, and paints.  Decrease humidity in your home and use an indoor air cleaning device. Reduce indoor humidity to below 60%. Dehumidifiers or central air conditioners can do this.  Decrease house dust exposure by changing furnace and air cooler filters frequently.  Try to have someone else vacuum for you once or twice a week. Stay out of rooms while they are being vacuumed and for a short while afterward.  If you vacuum, use a dust mask from a hardware store, a double-layered or microfilter vacuum cleaner bag, or a vacuum cleaner with a HEPA filter.  Sulfites in foods and beverages can be irritants. Do not  drink beer or wine or eat dried fruit, processed potatoes, or shrimp if they cause asthma symptoms.  Cold air can trigger an asthma attack. Cover your nose and mouth with a scarf on cold or windy days.  Several health conditions can make asthma more difficult to manage, including a runny nose, sinus infections, reflux disease, psychological stress, and sleep apnea. Work with your health care provider to manage these conditions.  Avoid close contact with people who have a respiratory infection such as a cold or the flu, since your asthma symptoms may get worse if you catch the infection. Wash your hands thoroughly after touching items that may have been handled by people with a respiratory infection.  Get a flu shot every year to protect against the flu virus, which often makes asthma worse for days or weeks. Also get a pneumonia  shot if you have not previously had one. Unlike the flu shot, the pneumonia shot does not need to be given yearly. Medicines:  Talk to your health care provider about whether it is safe for you to take aspirin or non-steroidal anti-inflammatory medicines (NSAIDs). In a small number of people with asthma, aspirin and NSAIDs can cause asthma attacks. These medicines must be avoided by people who have known aspirin-sensitive asthma. It is important that people with aspirin-sensitive asthma read labels of all over-the-counter medicines used to treat pain, colds, coughs, and fever.  Beta-blockers and ACE inhibitors are other medicines you should discuss with your health care provider. HOW CAN I FIND OUT WHAT I AM ALLERGIC TO? Ask your asthma health care provider about allergy skin testing or blood testing (the RAST test) to identify the allergens to which you are sensitive. If you are found to have allergies, the most important thing to do is to try to avoid exposure to any allergens that you are sensitive to as much as possible. Other treatments for allergies, such as medicines and allergy shots (immunotherapy) are available.  CAN I EXERCISE? Follow your health care provider's advice regarding asthma treatment before exercising. It is important to maintain a regular exercise program, but vigorous exercise or exercise in cold, humid, or dry environments can cause asthma attacks, especially for those people who have exercise-induced asthma. Document Released: 10/28/2009 Document Revised: 11/14/2013 Document Reviewed: 05/17/2013 [facility] Patient Information 2015 [place], [place]. This information is not intended to replace advice given to you by your health care provider. Make sure you discuss any questions you have with your health care provider. Asthma Asthma is a recurring condition in which the airways tighten and narrow. Asthma can make it difficult to breathe. It can cause coughing, wheezing, and  shortness of breath. Asthma episodes, also called asthma attacks, range from minor to life-threatening. Asthma cannot be cured, but medicines and lifestyle changes can help control it. CAUSES Asthma is believed to be caused by inherited (genetic) and environmental factors, but its exact cause is unknown. Asthma may be triggered by allergens, lung infections, or irritants in the air. Asthma triggers are different for each person. Common triggers include:   Animal dander.  Dust mites.  Cockroaches.  Pollen from trees or grass.  Mold.  Smoke.  Air pollutants such as dust, household cleaners, hair sprays, aerosol sprays, paint fumes, strong chemicals, or strong odors.  Cold air, weather changes, and winds (which increase molds and pollens in the air).  Strong emotional expressions such as crying or laughing hard.  Stress.  Certain medicines (such as aspirin) or types of drugs (such as  beta-blockers).  Sulfites in foods and drinks. Foods and drinks that may contain sulfites include dried fruit, potato chips, and sparkling grape juice.  Infections or inflammatory conditions such as the flu, a cold, or an inflammation of the nasal membranes (rhinitis).  Gastroesophageal reflux disease (GERD).  Exercise or strenuous activity. SYMPTOMS Symptoms may occur immediately after asthma is triggered or many hours later. Symptoms include:  Wheezing.  Excessive nighttime or early morning coughing.  Frequent or severe coughing with a common cold.  Chest tightness.  Shortness of breath. DIAGNOSIS  The diagnosis of asthma is made by a review of your medical history and a physical exam. Tests may also be performed. These may include:  Lung function studies. These tests show how much air you breathe in and out.  Allergy tests.  Imaging tests such as X-rays. TREATMENT  Asthma cannot be cured, but it can usually be controlled. Treatment involves identifying and avoiding your asthma  triggers. It also involves medicines. There are 2 classes of medicine used for asthma treatment:   Controller medicines. These prevent asthma symptoms from occurring. They are usually taken every day.  Reliever or rescue medicines. These quickly relieve asthma symptoms. They are used as needed and provide short-term relief. Your health care provider will help you create an asthma action plan. An asthma action plan is a written plan for managing and treating your asthma attacks. It includes a list of your asthma triggers and how they may be avoided. It also includes information on when medicines should be taken and when their dosage should be changed. An action plan may also involve the use of a device called a peak flow meter. A peak flow meter measures how well the lungs are working. It helps you monitor your condition. HOME CARE INSTRUCTIONS   Take medicine as directed by your health care provider. Speak with your health care provider if you have questions about how or when to take the medicines.  Use a peak flow meter as directed by your health care provider. Record and keep track of readings.  Understand and use the action plan to help minimize or stop an asthma attack without needing to seek medical care.  Control your home environment in the following ways to help prevent asthma attacks:  Do not smoke. Avoid being exposed to secondhand smoke.  Change your heating and air conditioning filter regularly.  Limit your use of fireplaces and wood stoves.  Get rid of pests (such as roaches and mice) and their droppings.  Throw away plants if you see mold on them.  Clean your floors and dust regularly. Use unscented cleaning products.  Try to have someone else vacuum for you regularly. Stay out of rooms while they are being vacuumed and for a short while afterward. If you vacuum, use a dust mask from a hardware store, a double-layered or microfilter vacuum cleaner bag, or a vacuum cleaner  with a HEPA filter.  Replace carpet with wood, tile, or vinyl flooring. Carpet can trap dander and dust.  Use allergy-proof pillows, mattress covers, and box spring covers.  Wash bed sheets and blankets every week in hot water and dry them in a dryer.  Use blankets that are made of polyester or cotton.  Clean bathrooms and kitchens with bleach. If possible, have someone repaint the walls in these rooms with mold-resistant paint. Keep out of the rooms that are being cleaned and painted.  Wash hands frequently. SEEK MEDICAL CARE IF:   You have wheezing,  shortness of breath, or a cough even if taking medicine to prevent attacks.  The colored mucus you cough up (sputum) is thicker than usual.  Your sputum changes from clear or white to yellow, green, gray, or bloody.  You have any problems that may be related to the medicines you are taking (such as a rash, itching, swelling, or trouble breathing).  You are using a reliever medicine more than 2-3 times per week.  Your peak flow is still at 50-79% of your personal best after following your action plan for 1 hour. SEEK IMMEDIATE MEDICAL CARE IF:   You seem to be getting worse and are unresponsive to treatment during an asthma attack.  You are short of breath even at rest.  You get short of breath when doing very little physical activity.  You have difficulty eating, drinking, or talking due to asthma symptoms.  You develop chest pain.  You develop a fast heartbeat.  You have a bluish color to your lips or fingernails.  You are lightheaded, dizzy, or faint.  Your peak flow is less than 50% of your personal best.  You have a fever or persistent symptoms for more than 2-3 days.  You have a fever and symptoms suddenly get worse. MAKE SURE YOU:   Understand these instructions.  Will watch your condition.  Will get help right away if you are not doing well or get worse. Document Released: 11/09/2005 Document Revised:  11/14/2013 Document Reviewed: 06/08/2013 [facility] Patient Information 2015 [place], [place]. This information is not intended to replace advice given to you by your health care provider. Make sure you discuss any questions you have with your health care provider.

## 2014-06-06 NOTE — Progress Notes (Signed)
Pre visit review using our clinic review tool, if applicable. No additional management support is needed unless otherwise documented below in the visit note. 

## 2014-07-12 ENCOUNTER — Telehealth: Payer: Self-pay | Admitting: *Deleted

## 2014-07-12 DIAGNOSIS — B373 Candidiasis of vulva and vagina: Secondary | ICD-10-CM

## 2014-07-12 DIAGNOSIS — B3731 Acute candidiasis of vulva and vagina: Secondary | ICD-10-CM

## 2014-07-12 MED ORDER — FLUCONAZOLE 150 MG PO TABS: 150.0000 mg | ORAL_TABLET | ORAL | Status: DC

## 2014-07-12 NOTE — Telephone Encounter (Signed)
Patient called regarding a yeast infection. 6:48 Attempted to call patient- Lm that Rx had been sent to pharmacy for Diflucan and to call back if no better.

## 2014-08-02 ENCOUNTER — Telehealth: Payer: Self-pay | Admitting: Internal Medicine

## 2014-08-02 MED ORDER — MOMETASONE FURO-FORMOTEROL FUM 200-5 MCG/ACT IN AERO: 2.0000 | INHALATION_SPRAY | Freq: Two times a day (BID) | RESPIRATORY_TRACT | Status: DC

## 2014-08-02 NOTE — Telephone Encounter (Signed)
Spoke to pt asked her what dosage of Elwin Sleight is she on? Pt said . Asked pt if she is using both Symbicort and Dulera? Pt said yes. Told her she should use one or the other not both per Dr. Caryl Never. Both are long acting inhalers. Pt verbalized understanding. Told pt I will send in Rx for [facility] being as it is stronger and can use twice a day. Let me know how you feel. Pt verbalized understanding. Rx sent to pharmacy.

## 2014-08-02 NOTE — Telephone Encounter (Signed)
Pt had samples and needs new rx symbicort 80/4.5 and dulera inhaler [facility]

## 2014-09-24 ENCOUNTER — Encounter: Payer: Self-pay | Admitting: Internal Medicine

## 2014-10-11 ENCOUNTER — Ambulatory Visit (INDEPENDENT_AMBULATORY_CARE_PROVIDER_SITE_OTHER): Payer: BC Managed Care – PPO | Admitting: Family Medicine

## 2014-10-11 ENCOUNTER — Encounter: Payer: Self-pay | Admitting: Family Medicine

## 2014-10-11 VITALS — BP 110/80 | HR 74 | Temp 99.0°F | Wt 178.0 lb

## 2014-10-11 DIAGNOSIS — R5383 Other fatigue: Secondary | ICD-10-CM

## 2014-10-11 DIAGNOSIS — G4700 Insomnia, unspecified: Secondary | ICD-10-CM

## 2014-10-11 LAB — CBC WITH DIFFERENTIAL/PLATELET
BASOS PCT: 0.6 % (ref 0.0–3.0)
Basophils Absolute: 0.1 10*3/uL (ref 0.0–0.1)
Eosinophils Absolute: 0.1 10*3/uL (ref 0.0–0.7)
Eosinophils Relative: 1.2 % (ref 0.0–5.0)
HCT: 38.1 % (ref 36.0–46.0)
Hemoglobin: 12.4 g/dL (ref 12.0–15.0)
LYMPHS ABS: 2.2 10*3/uL (ref 0.7–4.0)
Lymphocytes Relative: 27.2 % (ref 12.0–46.0)
MCHC: 32.6 g/dL (ref 30.0–36.0)
MCV: 89.3 fl (ref 78.0–100.0)
MONO ABS: 0.4 10*3/uL (ref 0.1–1.0)
Monocytes Relative: 5.4 % (ref 3.0–12.0)
NEUTROS PCT: 65.6 % (ref 43.0–77.0)
Neutro Abs: 5.3 10*3/uL (ref 1.4–7.7)
Platelets: 286 10*3/uL (ref 150.0–400.0)
RBC: 4.26 Mil/uL (ref 3.87–5.11)
RDW: 13.4 % (ref 11.5–15.5)
WBC: 8 10*3/uL (ref 4.0–10.5)

## 2014-10-11 LAB — TSH: TSH: 0.99 u[IU]/mL (ref 0.35–4.50)

## 2014-10-11 NOTE — Progress Notes (Signed)
   Subjective:    Patient ID: Shelly [name], female    DOB: [birth year], 43 y.o.   MRN: [id number]  HPI   Acute visit. Patient seen with relatively acute onset Sunday about 5 days ago of some lightheadedness fatigue and insomnia. She is currently in school studying art and some stress-related that that is not new. Denies depression. She's had slightly diminished appetite. Denies any headache, fever, chills, cough, sore throat, abdominal pain, or any change in urine or stool habits. She states she felt like this once before when she was anemic. She does have fairly headaches having menstrual periods. No consistent orthostatic symptoms  She has also complained of some insomnia past several nights. No new stressors. Minimal caffeine use. No regular alcohol use.  Past Medical History  Diagnosis Date  . GERD (gastroesophageal reflux disease)   . Depression   . Anxiety    Past Surgical History  Procedure Laterality Date  . C/s x2    . Arthoscopic knee    . Cesarean section    . Wisdom tooth extraction Bilateral     reports that she has quit smoking. She does not have any smokeless tobacco history on file. She reports that she drinks alcohol. She reports that she does not use illicit drugs. family history includes Alcohol abuse in her paternal grandmother; Arthritis in her mother; COPD in her father; Cancer in her paternal grandmother; Emphysema in her father. No Known Allergies    Review of Systems  Constitutional: Positive for appetite change and fatigue. Negative for fever and chills.  HENT: Negative for sore throat.   Respiratory: Negative for cough.   Cardiovascular: Negative for chest pain, palpitations and leg swelling.  Gastrointestinal: Negative for vomiting, abdominal pain, diarrhea, constipation and blood in stool.  Genitourinary: Negative for dysuria.  Neurological: Positive for dizziness. Negative for syncope and headaches.  Hematological: Negative for adenopathy. Does not  bruise/bleed easily.  Psychiatric/Behavioral: Negative for dysphoric mood.       Objective:   Physical Exam  Constitutional: She is oriented to person, place, and time. She appears well-developed and well-nourished.  HENT:  Right Ear: External ear normal.  Left Ear: External ear normal.  Mouth/Throat: Oropharynx is clear and moist.  Neck: Neck supple. No thyromegaly present.  Cardiovascular: Normal rate and regular rhythm.  Exam reveals no gallop.   No murmur heard. Pulmonary/Chest: Effort normal and breath sounds normal. No respiratory distress. She has no wheezes. She has no rales.  Musculoskeletal: She exhibits no edema.  Lymphadenopathy:    She has no cervical adenopathy.  Neurological: She is alert and oriented to person, place, and time.  Psychiatric: She has a normal mood and affect. Her behavior is normal.          Assessment & Plan:  #1 relatively acute onset of fatigue and lightheadedness. Clinically, she does not appear anemic but will check CBC along with other labs including TSH and chemistries.  #2 insomnia- sleep hygiene discussed with handout given.

## 2014-10-11 NOTE — Patient Instructions (Signed)
Insomnia Insomnia is frequent trouble falling and/or staying asleep. Insomnia can be a long term problem or a short term problem. Both are common. Insomnia can be a short term problem when the wakefulness is related to a certain stress or worry. Long term insomnia is often related to ongoing stress during waking hours and/or poor sleeping habits. Overtime, sleep deprivation itself can make the problem worse. Every little thing feels more severe because you are overtired and your ability to cope is decreased. CAUSES   Stress, anxiety, and depression.  Poor sleeping habits.  Distractions such as TV in the bedroom.  Naps close to bedtime.  Engaging in emotionally charged conversations before bed.  Technical reading before sleep.  Alcohol and other sedatives. They may make the problem worse. They can hurt normal sleep patterns and normal dream activity.  Stimulants such as caffeine for several hours prior to bedtime.  Pain syndromes and shortness of breath can cause insomnia.  Exercise late at night.  Changing time zones may cause sleeping problems (jet lag). It is sometimes helpful to have someone observe your sleeping patterns. They should look for periods of not breathing during the night (sleep apnea). They should also look to see how long those periods last. If you live alone or observers are uncertain, you can also be observed at a sleep clinic where your sleep patterns will be professionally monitored. Sleep apnea requires a checkup and treatment. Give your caregivers your medical history. Give your caregivers observations your family has made about your sleep.  SYMPTOMS   Not feeling rested in the morning.  Anxiety and restlessness at bedtime.  Difficulty falling and staying asleep. TREATMENT   Your caregiver may prescribe treatment for an underlying medical disorders. Your caregiver can give advice or help if you are using alcohol or other drugs for self-medication. Treatment  of underlying problems will usually eliminate insomnia problems.  Medications can be prescribed for short time use. They are generally not recommended for lengthy use.  Over-the-counter sleep medicines are not recommended for lengthy use. They can be habit forming.  You can promote easier sleeping by making lifestyle changes such as:  Using relaxation techniques that help with breathing and reduce muscle tension.  Exercising earlier in the day.  Changing your diet and the time of your last meal. No night time snacks.  Establish a regular time to go to bed.  Counseling can help with stressful problems and worry.  Soothing music and white noise may be helpful if there are background noises you cannot remove.  Stop tedious detailed work at least one hour before bedtime. HOME CARE INSTRUCTIONS   Keep a diary. Inform your caregiver about your progress. This includes any medication side effects. See your caregiver regularly. Take note of:  Times when you are asleep.  Times when you are awake during the night.  The quality of your sleep.  How you feel the next day. This information will help your caregiver care for you.  Get out of bed if you are still awake after 15 minutes. Read or do some quiet activity. Keep the lights down. Wait until you feel sleepy and go back to bed.  Keep regular sleeping and waking hours. Avoid naps.  Exercise regularly.  Avoid distractions at bedtime. Distractions include watching television or engaging in any intense or detailed activity like attempting to balance the household checkbook.  Develop a bedtime ritual. Keep a familiar routine of bathing, brushing your teeth, climbing into bed at the same   time each night, listening to soothing music. Routines increase the success of falling to sleep faster.  Use relaxation techniques. This can be using breathing and muscle tension release routines. It can also include visualizing peaceful scenes. You can  also help control troubling or intruding thoughts by keeping your mind occupied with boring or repetitive thoughts like the old concept of counting sheep. You can make it more creative like imagining planting one beautiful flower after another in your backyard garden.  During your day, work to eliminate stress. When this is not possible use some of the previous suggestions to help reduce the anxiety that accompanies stressful situations. MAKE SURE YOU:   Understand these instructions.  Will watch your condition.  Will get help right away if you are not doing well or get worse. Document Released: 11/06/2000 Document Revised: 02/01/2012 Document Reviewed: 12/07/2007 Exit[employer] Patient Information 2015 [employer]. This information is not intended to replace advice given to you by your health care provider. Make sure you discuss any questions you have with your health care provider.  

## 2014-10-12 ENCOUNTER — Other Ambulatory Visit: Payer: Self-pay | Admitting: Family Medicine

## 2014-10-12 ENCOUNTER — Other Ambulatory Visit: Payer: Self-pay | Admitting: Internal Medicine

## 2014-10-12 DIAGNOSIS — E876 Hypokalemia: Secondary | ICD-10-CM

## 2014-10-12 LAB — HEPATIC FUNCTION PANEL
ALT: 15 U/L (ref 0–35)
AST: 22 U/L (ref 0–37)
Albumin: 4 g/dL (ref 3.5–5.2)
Alkaline Phosphatase: 56 U/L (ref 39–117)
Bilirubin, Direct: 0 mg/dL (ref 0.0–0.3)
TOTAL PROTEIN: 7.2 g/dL (ref 6.0–8.3)
Total Bilirubin: 0.2 mg/dL (ref 0.2–1.2)

## 2014-10-12 LAB — BASIC METABOLIC PANEL
BUN: 11 mg/dL (ref 6–23)
CALCIUM: 9.1 mg/dL (ref 8.4–10.5)
CO2: 25 meq/L (ref 19–32)
CREATININE: 1 mg/dL (ref 0.4–1.2)
Chloride: 104 mEq/L (ref 96–112)
GFR: 65.9 mL/min (ref >60.00)
GLUCOSE: 100 mg/dL — AB (ref 70–99)
Potassium: 3 mEq/L — ABNORMAL LOW (ref 3.5–5.1)
Sodium: 138 mEq/L (ref 135–145)

## 2014-11-19 ENCOUNTER — Encounter: Payer: Self-pay | Admitting: *Deleted

## 2015-04-17 ENCOUNTER — Telehealth: Payer: Self-pay | Admitting: *Deleted

## 2015-04-17 DIAGNOSIS — B373 Candidiasis of vulva and vagina: Secondary | ICD-10-CM

## 2015-04-17 DIAGNOSIS — B3731 Acute candidiasis of vulva and vagina: Secondary | ICD-10-CM

## 2015-04-17 MED ORDER — FLUCONAZOLE 150 MG PO TABS: 150.0000 mg | ORAL_TABLET | ORAL | Status: DC

## 2015-04-17 NOTE — Telephone Encounter (Signed)
Patient has a yeast infection and the OTC treatment is not working. 11:15 Call to patient- she is experiencing yeast symptoms and the OTC treatment has not made her symptoms go away- she has classic discharge and itching and request the treatment that Dr Tamela OddiJackson Moore had given her in the past. She has an annual exam appointment scheduled 6/1.OK for Diflucan per Boykin Reaperachelle

## 2015-04-24 ENCOUNTER — Ambulatory Visit: Payer: Self-pay | Admitting: Certified Nurse Midwife

## 2015-05-07 ENCOUNTER — Ambulatory Visit (INDEPENDENT_AMBULATORY_CARE_PROVIDER_SITE_OTHER): Payer: 59 | Admitting: Certified Nurse Midwife

## 2015-05-07 ENCOUNTER — Telehealth: Payer: Self-pay

## 2015-05-07 ENCOUNTER — Encounter: Payer: Self-pay | Admitting: Certified Nurse Midwife

## 2015-05-07 VITALS — BP 120/81 | HR 95 | Temp 98.7°F | Wt 179.0 lb

## 2015-05-07 DIAGNOSIS — Z01419 Encounter for gynecological examination (general) (routine) without abnormal findings: Secondary | ICD-10-CM | POA: Diagnosis not present

## 2015-05-07 LAB — COMPREHENSIVE METABOLIC PANEL
ALT: 16 U/L (ref 0–35)
AST: 17 U/L (ref 0–37)
Albumin: 4.1 g/dL (ref 3.5–5.2)
Alkaline Phosphatase: 58 U/L (ref 39–117)
BUN: 11 mg/dL (ref 6–23)
CALCIUM: 9.3 mg/dL (ref 8.4–10.5)
CO2: 27 meq/L (ref 19–32)
CREATININE: 0.95 mg/dL (ref 0.50–1.10)
Chloride: 102 mEq/L (ref 96–112)
GLUCOSE: 77 mg/dL (ref 70–99)
Potassium: 3.9 mEq/L (ref 3.5–5.3)
SODIUM: 139 meq/L (ref 135–145)
TOTAL PROTEIN: 7 g/dL (ref 6.0–8.3)
Total Bilirubin: 0.3 mg/dL (ref 0.2–1.2)

## 2015-05-07 LAB — TSH: TSH: 1.147 u[IU]/mL (ref 0.350–4.500)

## 2015-05-07 NOTE — Progress Notes (Signed)
Patient ID: Shelly [name], female   DOB: 1971/07/25, 44 y.o.   MRN: [id number]   Subjective:      Shelly [name] is a 44 y.o. female here for a routine exam.  Current complaints: none.   Currently sexually active.   Mother had cervical ca, sister had precancerous cervical cells. Had BTL about 4 years ago.  Menses are regular, lasting about 5-7 days, with clots.  Has endometrial polyp, was going to have surgery last December but did not have medical coverage.  Employed as a Horticulturist, commercial.      Personal health questionnaire:  Is patient Ashkenazi Jewish, have a family history of breast and/or ovarian cancer:  no Is there a family history of uterine cancer diagnosed at age < 68, gastrointestinal cancer, urinary tract cancer, family member who is a Personnel officer syndrome-associated carrier: no Is the patient overweight and hypertensive, family history of diabetes, personal history of gestational diabetes, preeclampsia or PCOS: no Is patient over 77, have PCOS,  family history of premature CHD under age 64, diabetes, smoke, have hypertension or peripheral artery disease:  no At any time, has a partner hit, kicked or otherwise hurt or frightened you?: no Over the past 2 weeks, have you felt down, depressed or hopeless?: no Over the past 2 weeks, have you felt little interest or pleasure in doing things?:no   Gynecologic History Patient's last menstrual period was 04/23/2015. Contraception: tubal ligation Last Pap: 09/20/13. Results were: normal Last mammogram: 06/2013. Results were: normal  Obstetric History OB History  Gravida Para Term Preterm AB SAB TAB Ectopic Multiple Living  0 # Outcome Date GA Lbr Len/2nd Weight Sex Delivery Anes PTL Lv  5 SAB           4 Term           3 Term           2 Term              Comments: System Generated. Please review and update pregnancy details.  1 TAB               Past Medical History  Diagnosis Date  . GERD (gastroesophageal reflux  disease)   . Depression   . Anxiety     Past Surgical History  Procedure Laterality Date  . C/s x2    . Arthoscopic knee    . Cesarean section    . Wisdom tooth extraction Bilateral      Current outpatient prescriptions:  .  albuterol (PROVENTIL HFA) 108 (90 BASE) MCG/ACT inhaler, Inhale 1 puff into the lungs daily as needed., Disp: 1 Inhaler, Rfl: 6 .  mometasone-formoterol (DULERA) 200-5 MCG/ACT AERO, Inhale 2 puffs into the lungs 2 (two) times daily., Disp: 1 Inhaler, Rfl: 5 .  Multiple Vitamin (MULTIVITAMIN) tablet, Take 1 tablet by mouth daily., Disp: , Rfl:  .  montelukast (SINGULAIR) 10 MG tablet, Take 1 tablet (10 mg total) by mouth at bedtime. (Patient not taking: Reported on 05/07/2015), Disp: 90 tablet, Rfl: 3 .  montelukast (SINGULAIR) 10 MG tablet, TAKE 1 TABLET BY MOUTH EVERY NIGHT AT BEDTIME (Patient not taking: Reported on 05/07/2015), Disp: 90 tablet, Rfl: 1 No Known Allergies  History  Substance Use Topics  . Smoking status: Former Games developer  . Smokeless tobacco: Not on file  . Alcohol Use: 0.0 oz/week    0 Standard drinks or equivalent per week  Comment: occasional    Family History  Problem Relation Age of Onset  . COPD Father   . Emphysema Father   . Arthritis Mother   . Alcohol abuse Paternal Grandmother   . Cancer Paternal Grandmother       Review of Systems  Constitutional: negative for fatigue and weight loss Respiratory: negative for cough and wheezing Cardiovascular: negative for chest pain, fatigue and palpitations Gastrointestinal: negative for abdominal pain and change in bowel habits Musculoskeletal:negative for myalgias Neurological: negative for gait problems and tremors Behavioral/Psych: negative for abusive relationship, depression Endocrine: negative for temperature intolerance   Genitourinary:negative for abnormal menstrual periods, genital lesions, hot flashes, sexual problems and vaginal discharge Integument/breast: negative for  breast lump, breast tenderness, nipple discharge and skin lesion(s)    Objective:       BP 120/81 mmHg  Pulse 95  Temp(Src) 98.7 F (37.1 C)  Wt 179 lb (81.194 kg)  LMP 04/23/2015 General:   alert  Skin:   no rash or abnormalities  Lungs:   clear to auscultation bilaterally  Heart:   regular rate and rhythm, S1, S2 normal, no murmur, click, rub or gallop  Breasts:   normal without suspicious masses, skin or nipple changes or axillary nodes  Abdomen:  normal findings: no organomegaly, soft, non-tender and no hernia  Pelvis:  External genitalia: normal general appearance Urinary system: urethral meatus normal and bladder without fullness, nontender Vaginal: normal without tenderness, induration or masses Cervix: normal appearance Adnexa: normal bimanual exam Uterus: anteverted and non-tender, normal size   Lab Review Urine pregnancy test Labs reviewed yes Radiologic studies reviewed yes  50% of 30 min visit spent on counseling and coordination of care.   Assessment:    Healthy female exam.    Plan:    Education reviewed: depression evaluation, low fat, low cholesterol diet, safe sex/STD prevention, self breast exams, skin cancer screening and weight bearing exercise. Contraception: tubal ligation. Mammogram ordered. Follow up in: 1 year.   Meds ordered this encounter  Medications  . Multiple Vitamin (MULTIVITAMIN) tablet    Sig: Take 1 tablet by mouth daily.   Orders Placed This Encounter  Procedures  . SureSwab, Vaginosis/Vaginitis Plus  . MM DIGITAL SCREENING BILATERAL    Has dense breast tissue, did not have a good experience with her first mammogram and has anxiety over repeating the same experience.    Standing Status: Future     Number of Occurrences:      Standing Expiration Date: 07/06/2016    Order Specific Question:  Reason for Exam (SYMPTOM  OR DIAGNOSIS REQUIRED)    Answer:  Annual exam    Order Specific Question:  Is the patient pregnant?     Answer:  No    Order Specific Question:  Preferred imaging location?    Answer:  [facility]  . HIV antibody (with reflex)  . Hepatitis B surface antigen  . RPR  . Hepatitis C antibody  . TSH  . Comprehensive metabolic panel   F/U for endometrial polyp with Dr. Clearance Coots when patient is ready.

## 2015-05-07 NOTE — Telephone Encounter (Signed)
Patient did not want to have a mammogram and declined to schedule at [facility] or anywhere

## 2015-05-08 LAB — HEPATITIS C ANTIBODY: HCV Ab: NEGATIVE

## 2015-05-08 LAB — RPR

## 2015-05-08 LAB — HIV ANTIBODY (ROUTINE TESTING W REFLEX): HIV 1&2 Ab, 4th Generation: NONREACTIVE

## 2015-05-08 LAB — HEPATITIS B SURFACE ANTIGEN: Hepatitis B Surface Ag: NEGATIVE

## 2015-05-09 LAB — PAP IG AND HPV HIGH-RISK: HPV DNA High Risk: NOT DETECTED

## 2015-05-10 LAB — SURESWAB, VAGINOSIS/VAGINITIS PLUS
Atopobium vaginae: NOT DETECTED Log (cells/mL)
C. GLABRATA, DNA: NOT DETECTED
C. TRACHOMATIS RNA, TMA: NOT DETECTED
C. TROPICALIS, DNA: NOT DETECTED
C. albicans, DNA: NOT DETECTED
C. parapsilosis, DNA: NOT DETECTED
Gardnerella vaginalis: <4.7 Log (cells/mL)
LACTOBACILLUS SPECIES: 7.6 Log (cells/mL)
MEGASPHAERA SPECIES: NOT DETECTED Log (cells/mL)
N. gonorrhoeae RNA, TMA: NOT DETECTED
T. VAGINALIS RNA, QL TMA: NOT DETECTED

## 2015-05-24 ENCOUNTER — Other Ambulatory Visit: Payer: Self-pay | Admitting: Orthopedic Surgery

## 2015-05-28 ENCOUNTER — Other Ambulatory Visit: Payer: Self-pay

## 2015-05-28 MED ORDER — MOMETASONE FURO-FORMOTEROL FUM 200-5 MCG/ACT IN AERO: 2.0000 | INHALATION_SPRAY | Freq: Two times a day (BID) | RESPIRATORY_TRACT | Status: DC

## 2015-06-04 ENCOUNTER — Telehealth: Payer: Self-pay | Admitting: Internal Medicine

## 2015-06-04 NOTE — Telephone Encounter (Signed)
[facility]  Patient's plan requires patient to try and fail one of the following:  Advair or Breo Ellipta.

## 2015-06-04 NOTE — Telephone Encounter (Signed)
Breo 200/25  once daily

## 2015-06-04 NOTE — Telephone Encounter (Signed)
Please see message and advise 

## 2015-06-06 MED ORDER — FLUTICASONE FUROATE-VILANTEROL 200-25 MCG/INH IN AEPB: 1.0000 | INHALATION_SPRAY | Freq: Every day | RESPIRATORY_TRACT | Status: DC

## 2015-06-06 NOTE — Telephone Encounter (Signed)
Spoke to pt, told [facility]. Dr.K is going to put you on Breo 200/25 one inhalation daily, similar [facility]. Pt verbalized understanding. Told pt Rx sent to pharmacy.Pt verbalized understanding.

## 2015-06-07 ENCOUNTER — Encounter (HOSPITAL_BASED_OUTPATIENT_CLINIC_OR_DEPARTMENT_OTHER): Payer: Self-pay | Admitting: *Deleted

## 2015-06-10 ENCOUNTER — Encounter (HOSPITAL_BASED_OUTPATIENT_CLINIC_OR_DEPARTMENT_OTHER): Payer: Self-pay | Admitting: *Deleted

## 2015-06-10 NOTE — Progress Notes (Signed)
NPO AFTER MN WITH EXCEPTION CLEAR LIQUIDS UNTIL 0800.   ARRIVE AT 1200.  NEEDS HG.

## 2015-06-11 ENCOUNTER — Encounter (HOSPITAL_BASED_OUTPATIENT_CLINIC_OR_DEPARTMENT_OTHER)
Admission: RE | Disposition: A | Discharge status: Home / Self Care | Payer: Self-pay | Source: Ambulatory Visit | Attending: Specialist

## 2015-06-11 ENCOUNTER — Ambulatory Visit (HOSPITAL_BASED_OUTPATIENT_CLINIC_OR_DEPARTMENT_OTHER): Payer: 59 | Admitting: Anesthesiology

## 2015-06-11 ENCOUNTER — Encounter (HOSPITAL_BASED_OUTPATIENT_CLINIC_OR_DEPARTMENT_OTHER): Payer: Self-pay | Admitting: *Deleted

## 2015-06-11 ENCOUNTER — Ambulatory Visit (HOSPITAL_BASED_OUTPATIENT_CLINIC_OR_DEPARTMENT_OTHER)
Admission: RE | Admit: 2015-06-11 | Discharge: 2015-06-11 | Disposition: A | Discharge status: Home / Self Care | Payer: 59 | Source: Ambulatory Visit | Attending: Specialist | Admitting: Specialist

## 2015-06-11 DIAGNOSIS — Z9889 Other specified postprocedural states: Secondary | ICD-10-CM

## 2015-06-11 DIAGNOSIS — M93261 Osteochondritis dissecans, right knee: Secondary | ICD-10-CM | POA: Insufficient documentation

## 2015-06-11 DIAGNOSIS — M2241 Chondromalacia patellae, right knee: Secondary | ICD-10-CM | POA: Diagnosis not present

## 2015-06-11 DIAGNOSIS — Z79899 Other long term (current) drug therapy: Secondary | ICD-10-CM | POA: Insufficient documentation

## 2015-06-11 DIAGNOSIS — M13861 Other specified arthritis, right knee: Secondary | ICD-10-CM | POA: Diagnosis not present

## 2015-06-11 DIAGNOSIS — J45909 Unspecified asthma, uncomplicated: Secondary | ICD-10-CM | POA: Diagnosis not present

## 2015-06-11 DIAGNOSIS — Z87891 Personal history of nicotine dependence: Secondary | ICD-10-CM | POA: Insufficient documentation

## 2015-06-11 DIAGNOSIS — M25761 Osteophyte, right knee: Secondary | ICD-10-CM | POA: Insufficient documentation

## 2015-06-11 DIAGNOSIS — K219 Gastro-esophageal reflux disease without esophagitis: Secondary | ICD-10-CM | POA: Diagnosis not present

## 2015-06-11 DIAGNOSIS — M2341 Loose body in knee, right knee: Secondary | ICD-10-CM | POA: Diagnosis present

## 2015-06-11 HISTORY — DX: Other specified acquired deformities of musculoskeletal system: M95.8

## 2015-06-11 HISTORY — PX: CHONDROPLASTY: SHX5177

## 2015-06-11 HISTORY — PX: KNEE ARTHROSCOPY: SHX127

## 2015-06-11 HISTORY — DX: Loose body in knee, right knee: M23.41

## 2015-06-11 HISTORY — DX: Palpitations: R00.2

## 2015-06-11 HISTORY — PX: FOREIGN BODY REMOVAL: SHX962

## 2015-06-11 HISTORY — DX: Unspecified asthma, uncomplicated: J45.909

## 2015-06-11 LAB — POCT HEMOGLOBIN-HEMACUE: HEMOGLOBIN: 13.3 g/dL (ref 12.0–15.0)

## 2015-06-11 SURGERY — ARTHROSCOPY, KNEE
Anesthesia: General | Site: Knee | Laterality: Right

## 2015-06-11 MED ORDER — ONDANSETRON HCL 4 MG PO TABS: 4.0000 mg | ORAL_TABLET | Freq: Three times a day (TID) | ORAL | Status: DC | PRN

## 2015-06-11 MED ORDER — BUPIVACAINE HCL 0.25 % IJ SOLN
INTRAMUSCULAR | Status: DC | PRN
  Administered 2015-06-11: 30 mL

## 2015-06-11 MED ORDER — PROPOFOL 10 MG/ML IV BOLUS
INTRAVENOUS | Status: DC | PRN
  Administered 2015-06-11: 200 mg via INTRAVENOUS

## 2015-06-11 MED ORDER — LACTATED RINGERS IV SOLN
INTRAVENOUS | Status: DC
  Filled 2015-06-11: qty 1000

## 2015-06-11 MED ORDER — HYDROCODONE-ACETAMINOPHEN 5-325 MG PO TABS: 1.0000 | ORAL_TABLET | ORAL | Status: DC | PRN

## 2015-06-11 MED ORDER — ASPIRIN EC 325 MG PO TBEC: 325.0000 mg | DELAYED_RELEASE_TABLET | Freq: Two times a day (BID) | ORAL | Status: DC

## 2015-06-11 MED ORDER — CEPHALEXIN 500 MG PO CAPS: 500.0000 mg | ORAL_CAPSULE | Freq: Three times a day (TID) | ORAL | Status: DC

## 2015-06-11 MED ORDER — SCOPOLAMINE 1 MG/3DAYS TD PT72
MEDICATED_PATCH | TRANSDERMAL | Status: AC
  Filled 2015-06-11: qty 1

## 2015-06-11 MED ORDER — MIDAZOLAM HCL 2 MG/2ML IJ SOLN
INTRAMUSCULAR | Status: AC
  Filled 2015-06-11: qty 2

## 2015-06-11 MED ORDER — HYDROCODONE-ACETAMINOPHEN 5-325 MG PO TABS
1.0000 | ORAL_TABLET | ORAL | Status: DC | PRN
  Administered 2015-06-11: 1 via ORAL
  Filled 2015-06-11: qty 2

## 2015-06-11 MED ORDER — MIDAZOLAM HCL 5 MG/5ML IJ SOLN
INTRAMUSCULAR | Status: DC | PRN
  Administered 2015-06-11: 2 mg via INTRAVENOUS

## 2015-06-11 MED ORDER — SCOPOLAMINE 1 MG/3DAYS TD PT72
1.0000 | MEDICATED_PATCH | TRANSDERMAL | Status: DC
  Administered 2015-06-11: 1.5 mg via TRANSDERMAL
  Filled 2015-06-11: qty 1

## 2015-06-11 MED ORDER — ONDANSETRON HCL 4 MG/2ML IJ SOLN
INTRAMUSCULAR | Status: DC | PRN
  Administered 2015-06-11: 4 mg via INTRAVENOUS

## 2015-06-11 MED ORDER — LIDOCAINE HCL (CARDIAC) 20 MG/ML IV SOLN
INTRAVENOUS | Status: DC | PRN
  Administered 2015-06-11: 60 mg via INTRAVENOUS

## 2015-06-11 MED ORDER — HYDROMORPHONE HCL 1 MG/ML IJ SOLN
0.2500 mg | INTRAMUSCULAR | Status: DC | PRN
  Filled 2015-06-11: qty 1

## 2015-06-11 MED ORDER — CEFAZOLIN SODIUM-DEXTROSE 2-3 GM-% IV SOLR
INTRAVENOUS | Status: AC
  Filled 2015-06-11: qty 50

## 2015-06-11 MED ORDER — SODIUM CHLORIDE 0.9 % IR SOLN
Status: DC | PRN
  Administered 2015-06-11: 6000 mL

## 2015-06-11 MED ORDER — MORPHINE SULFATE 4 MG/ML IJ SOLN
INTRAMUSCULAR | Status: DC | PRN
  Administered 2015-06-11: 4 mg via INTRAVENOUS

## 2015-06-11 MED ORDER — MORPHINE SULFATE 4 MG/ML IJ SOLN
INTRAMUSCULAR | Status: AC
  Filled 2015-06-11: qty 1

## 2015-06-11 MED ORDER — DEXAMETHASONE SODIUM PHOSPHATE 4 MG/ML IJ SOLN
INTRAMUSCULAR | Status: DC | PRN
  Administered 2015-06-11: 10 mg via INTRAVENOUS

## 2015-06-11 MED ORDER — FENTANYL CITRATE (PF) 100 MCG/2ML IJ SOLN
INTRAMUSCULAR | Status: DC | PRN
  Administered 2015-06-11 (×2): 25 ug via INTRAVENOUS
  Administered 2015-06-11 (×2): 50 ug via INTRAVENOUS

## 2015-06-11 MED ORDER — FENTANYL CITRATE (PF) 100 MCG/2ML IJ SOLN
INTRAMUSCULAR | Status: AC
  Filled 2015-06-11: qty 6

## 2015-06-11 MED ORDER — HYDROCODONE-ACETAMINOPHEN 5-325 MG PO TABS
ORAL_TABLET | ORAL | Status: AC
Start: 2015-06-11 — End: 2015-06-11
  Filled 2015-06-11: qty 1

## 2015-06-11 MED ORDER — PROMETHAZINE HCL 25 MG/ML IJ SOLN
6.2500 mg | INTRAMUSCULAR | Status: DC | PRN
Start: 2015-06-11 — End: 2015-06-11
  Filled 2015-06-11: qty 1

## 2015-06-11 MED ORDER — LACTATED RINGERS IV SOLN
INTRAVENOUS | Status: DC
  Administered 2015-06-11: 13:00:00 via INTRAVENOUS
  Filled 2015-06-11: qty 1000

## 2015-06-11 MED ORDER — CEFAZOLIN SODIUM-DEXTROSE 2-3 GM-% IV SOLR
2.0000 g | INTRAVENOUS | Status: AC
  Administered 2015-06-11: 2 g via INTRAVENOUS
  Filled 2015-06-11: qty 50

## 2015-06-11 MED ORDER — POVIDONE-IODINE 7.5 % EX SOLN
Freq: Once | CUTANEOUS | Status: DC
  Filled 2015-06-11: qty 118

## 2015-06-11 SURGICAL SUPPLY — 56 items
BANDAGE ESMARK 6X9 LF (GAUZE/BANDAGES/DRESSINGS) ×1 IMPLANT
BLADE 4.2CUDA (BLADE) IMPLANT
BLADE CUDA 4.2 (BLADE) IMPLANT
BLADE CUDA GRT WHITE 3.5 (BLADE) ×2 IMPLANT
BLADE CUDA SHAVER 3.5 (BLADE) IMPLANT
BNDG CMPR 9X6 STRL LF SNTH (GAUZE/BANDAGES/DRESSINGS) ×1
BNDG ESMARK 6X9 LF (GAUZE/BANDAGES/DRESSINGS) ×2
BNDG GAUZE ELAST 4 BULKY (GAUZE/BANDAGES/DRESSINGS) ×3 IMPLANT
CANISTER SUCT LVC 12 LTR MEDI- (MISCELLANEOUS) ×8 IMPLANT
CANISTER SUCTION 1200CC (MISCELLANEOUS) ×2 IMPLANT
CLOTH BEACON ORANGE TIMEOUT ST (SAFETY) ×2 IMPLANT
CUFF TOURNIQUET SINGLE 34IN LL (TOURNIQUET CUFF) ×2 IMPLANT
DRAPE ARTHROSCOPY W/POUCH 114 (DRAPES) ×2 IMPLANT
DRAPE INCISE 23X17 IOBAN STRL (DRAPES) ×1
DRAPE INCISE 23X17 STRL (DRAPES) ×1 IMPLANT
DRAPE INCISE IOBAN 23X17 STRL (DRAPES) ×1 IMPLANT
DRAPE INCISE IOBAN 66X45 STRL (DRAPES) ×2 IMPLANT
DRSG PAD ABDOMINAL 8X10 ST (GAUZE/BANDAGES/DRESSINGS) ×1 IMPLANT
DURAPREP 26ML APPLICATOR (WOUND CARE) ×2 IMPLANT
ELECT MENISCUS 165MM 90D (ELECTRODE) IMPLANT
ELECT REM PT RETURN 9FT ADLT (ELECTROSURGICAL)
ELECTRODE REM PT RTRN 9FT ADLT (ELECTROSURGICAL) IMPLANT
GAUZE XEROFORM 1X8 LF (GAUZE/BANDAGES/DRESSINGS) ×2 IMPLANT
GLOVE BIO SURGEON STRL SZ7.5 (GLOVE) ×2 IMPLANT
GLOVE BIO SURGEON STRL SZ8 (GLOVE) ×4 IMPLANT
GLOVE INDICATOR 8.0 STRL GRN (GLOVE) ×4 IMPLANT
GOWN STRL REUS W/ TWL LRG LVL3 (GOWN DISPOSABLE) ×1 IMPLANT
GOWN STRL REUS W/ TWL XL LVL3 (GOWN DISPOSABLE) ×1 IMPLANT
GOWN STRL REUS W/TWL LRG LVL3 (GOWN DISPOSABLE) ×2
GOWN STRL REUS W/TWL XL LVL3 (GOWN DISPOSABLE) ×4
IMMOBILIZER KNEE 22 UNIV (SOFTGOODS) IMPLANT
IMMOBILIZER KNEE 24 THIGH 36 (MISCELLANEOUS) IMPLANT
IMMOBILIZER KNEE 24 UNIV (MISCELLANEOUS)
IV NS IRRIG 3000ML ARTHROMATIC (IV SOLUTION) ×5 IMPLANT
KNEE WRAP E Z 3 GEL PACK (MISCELLANEOUS) ×2 IMPLANT
MANIFOLD NEPTUNE II (INSTRUMENTS) ×1 IMPLANT
MINI VAC (SURGICAL WAND) ×2 IMPLANT
NEEDLE HYPO 22GX1.5 SAFETY (NEEDLE) ×2 IMPLANT
PACK ARTHROSCOPY DSU (CUSTOM PROCEDURE TRAY) ×2 IMPLANT
PACK BASIN DAY SURGERY FS (CUSTOM PROCEDURE TRAY) ×2 IMPLANT
PAD ABD 8X10 STRL (GAUZE/BANDAGES/DRESSINGS) ×4 IMPLANT
PAD ARMBOARD 7.5X6 YLW CONV (MISCELLANEOUS) IMPLANT
PADDING CAST ABS 4INX4YD NS (CAST SUPPLIES) ×1
PADDING CAST ABS COTTON 4X4 ST (CAST SUPPLIES) ×1 IMPLANT
PENCIL BUTTON HOLSTER BLD 10FT (ELECTRODE) IMPLANT
SET ARTHROSCOPY TUBING (MISCELLANEOUS) ×2
SET ARTHROSCOPY TUBING LN (MISCELLANEOUS) ×1 IMPLANT
SPONGE GAUZE 4X4 12PLY (GAUZE/BANDAGES/DRESSINGS) ×2 IMPLANT
SPONGE GAUZE 4X4 12PLY STER LF (GAUZE/BANDAGES/DRESSINGS) ×1 IMPLANT
SUT ETHILON 4 0 PS 2 18 (SUTURE) ×2 IMPLANT
SYR CONTROL 10ML LL (SYRINGE) ×2 IMPLANT
TOWEL OR 17X24 6PK STRL BLUE (TOWEL DISPOSABLE) ×2 IMPLANT
TUBE CONNECTING 12X1/4 (SUCTIONS) ×2 IMPLANT
WAND 30 DEG SABER W/CORD (SURGICAL WAND) IMPLANT
WAND 90 DEG TURBOVAC W/CORD (SURGICAL WAND) IMPLANT
WATER STERILE IRR 500ML POUR (IV SOLUTION) ×2 IMPLANT

## 2015-06-11 NOTE — Anesthesia Preprocedure Evaluation (Addendum)
Anesthesia Evaluation  Patient identified by MRN, date of birth, ID band Patient awake    Reviewed: Allergy & Precautions, NPO status , Patient's Chart, lab work & pertinent test results  History of Anesthesia Complications Negative for: history of anesthetic complications  Airway Mallampati: II  TM Distance: >3 FB Neck ROM: Full    Dental no notable dental hx. (+) Dental Advisory Given   Pulmonary asthma , former smoker,  breath sounds clear to auscultation  Pulmonary exam normal       Cardiovascular negative cardio ROS Normal cardiovascular examRhythm:Regular Rate:Normal     Neuro/Psych PSYCHIATRIC DISORDERS Anxiety Depression negative neurological ROS     GI/Hepatic Neg liver ROS, GERD-  Medicated and Controlled,  Endo/Other  negative endocrine ROS  Renal/GU negative Renal ROS  negative genitourinary   Musculoskeletal negative musculoskeletal ROS (+)   Abdominal   Peds negative pediatric ROS (+)  Hematology negative hematology ROS (+)   Anesthesia Other Findings   Reproductive/Obstetrics negative OB ROS                            Anesthesia Physical Anesthesia Plan  ASA: II  Anesthesia Plan: General   Post-op Pain Management:    Induction: Intravenous  Airway Management Planned: LMA  Additional Equipment:   Intra-op Plan:   Post-operative Plan: Extubation in OR  Informed Consent: I have reviewed the patients History and Physical, chart, labs and discussed the procedure including the risks, benefits and alternatives for the proposed anesthesia with the patient or authorized representative who has indicated his/her understanding and acceptance.   Dental advisory given  Plan Discussed with: CRNA  Anesthesia Plan Comments:         Anesthesia Quick Evaluation

## 2015-06-11 NOTE — Anesthesia Postprocedure Evaluation (Signed)
  Anesthesia Post-op Note  Patient: Shelly [name]  Procedure(s) Performed: Procedure(s): RIGHT ARTHROSCOPY KNEE (Right) RIGHT REMOVAL LOOSE BODY (Right) CHONDROPLASTY (Right)  Patient Location: PACU  Anesthesia Type:General  Level of Consciousness: awake  Airway and Oxygen Therapy: Patient Spontanous Breathing  Post-op Pain: mild  Post-op Assessment: Post-op Vital signs reviewed     RLE Motor Response: Purposeful movement, Responds to commands RLE Sensation: Full sensation      Post-op Vital Signs: Reviewed  Last Vitals:  Filed Vitals:   06/11/15 1615  BP: 127/70  Pulse: 59  Temp: 36.7 C  Resp: 16    Complications: No apparent anesthesia complications

## 2015-06-11 NOTE — Discharge Instructions (Signed)

## 2015-06-11 NOTE — H&P (View-Only) (Signed)
NPO AFTER MN WITH EXCEPTION CLEAR LIQUIDS UNTIL 0800.   ARRIVE AT 1200.  NEEDS HG. 

## 2015-06-11 NOTE — Transfer of Care (Signed)
Immediate Anesthesia Transfer of Care Note  Patient: Shelly [name]  Procedure(s) Performed: Procedure(s) (LRB): RIGHT ARTHROSCOPY KNEE (Right) RIGHT REMOVAL LOOSE BODY (Right) CHONDROPLASTY (Right)  Patient Location: PACU  Anesthesia Type: General  Level of Consciousness: awake, oriented, sedated and patient cooperative  Airway & Oxygen Therapy: Patient Spontanous Breathing and Patient connected to face mask oxygen  Post-op Assessment: Report given to PACU RN and Post -op Vital signs reviewed and stable  Post vital signs: Reviewed and stable  Complications: No apparent anesthesia complications

## 2015-06-11 NOTE — Anesthesia Procedure Notes (Signed)
Procedure Name: LMA Insertion Date/Time: 06/11/2015 1:55 PM Performed by: Renella CunasHAZEL, Sydni Elizarraraz D Pre-anesthesia Checklist: Patient identified, Emergency Drugs available, Suction available and Patient being monitored Patient Re-evaluated:Patient Re-evaluated prior to inductionOxygen Delivery Method: Circle System Utilized Preoxygenation: Pre-oxygenation with 100% oxygen Intubation Type: IV induction Ventilation: Mask ventilation without difficulty LMA: LMA inserted LMA Size: 4.0 Number of attempts: 1 Airway Equipment and Method: Bite block Placement Confirmation: positive ETCO2 Tube secured with: Tape Dental Injury: Teeth and Oropharynx as per pre-operative assessment

## 2015-06-11 NOTE — Interval H&P Note (Signed)
History and Physical Interval Note:  06/11/2015 1:55 PM  Shelly [name]  has presented today for surgery, with the diagnosis of RIGHT KNEE LOOSE BODY, OSTEOCHONDRAL DEFECT  The various methods of treatment have been discussed with the patient and family. After consideration of risks, benefits and other options for treatment, the patient has consented to  Procedure(s) with comments: RIGHT ARTHROSCOPY KNEE WITH DEBRIDEMENT, CARTILAGE HARVEST (Right) - ANESTHESIA: GENERAL, KNEE BLOCK RIGHT REMOVAL LOOSE BODY (Right) as a surgical intervention .  The patient's history has been reviewed, patient examined, no change in status, stable for surgery.  I have reviewed the patient's chart and labs.  Questions were answered to the patient's satisfaction.     Shelly [name] Shelly [name]

## 2015-06-11 NOTE — Op Note (Signed)
Dictated#[id number]

## 2015-06-11 NOTE — H&P (Signed)
Shelly [name] is an 44 y.o. female.   Chief Complaint: Right knee pain HPI: Patient presents with joint discomfort that had been persistent for several weeks now. Despite conservative treatments, her discomfort has not improved. Imaging was obtained. Other conservative and surgical treatments were discussed in detail. Patient wishes to proceed with surgery as consented. Denies SOB, CP, or calf pain. No Fever, chills, or nausea/ vomiting.   Past Medical History  Diagnosis Date  . GERD (gastroesophageal reflux disease)   . Depression   . Anxiety   . Asthma   . Loose body of right knee   . Osteochondral defect     right knee  . Intermittent palpitations     Past Surgical History  Procedure Laterality Date  . Wisdom tooth extraction Bilateral   . Cesarean section  06-03-2011//  09-27-2003//  2000    Bilateral Tubal Ligation w/ last c/s  . Knee arthroscopy Right 1990  . 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 COPD Father   . Emphysema Father   . Arthritis Mother   . Alcohol abuse Paternal Grandmother   . Cancer Paternal Grandmother    Social History:  reports that she quit smoking about 20 years ago. Her smoking use included Cigarettes. She quit after 6 years of use. She has never used smokeless tobacco. She reports that she drinks alcohol. She reports that she does not use illicit drugs.  Allergies: No Known Allergies  Medications Prior to Admission  Medication Sig Dispense Refill  . albuterol (PROVENTIL HFA) 108 (90 BASE) MCG/ACT inhaler Inhale 1 puff into the lungs daily as needed. 1 Inhaler 6  . calcium carbonate (TUMS - DOSED IN MG ELEMENTAL CALCIUM) 500 MG chewable tablet Chew 1 tablet by mouth as needed for indigestion or heartburn.    . Fluticasone Furoate-Vilanterol (BREO ELLIPTA) 200-25 MCG/INH AEPB Inhale 1 puff into the lungs daily. (Patient taking differently: Inhale 1 puff  into the lungs every evening. ) 28 each 5  . montelukast (SINGULAIR) 10 MG tablet TAKE 1 TABLET BY MOUTH EVERY NIGHT AT BEDTIME 90 tablet 1  . Multiple Vitamin (MULTIVITAMIN) tablet Take 1 tablet by mouth daily.      Results for orders placed or performed during the hospital encounter of 06/11/15 (from the past 48 hour(s))  Hemoglobin-hemacue, POC     Status: None   Collection Time: 06/11/15  1:07 PM  Result Value Ref Range   Hemoglobin 13.3 12.0 - 15.0 g/dL   No results found.  Review of Systems  Constitutional: Negative.   HENT: Negative.   Eyes: Negative.   Respiratory: Negative for cough, hemoptysis, sputum production and wheezing.   Cardiovascular: Negative.   Gastrointestinal: Negative.   Genitourinary: Negative.   Musculoskeletal: Positive for joint pain.  Skin: Negative.   Neurological: Negative.   Endo/Heme/Allergies: Negative.   Psychiatric/Behavioral: Negative.     Blood pressure 125/70, pulse 71, temperature 98.8 F (37.1 C), temperature source Oral, resp. rate 16, height  (1.651 m), weight 80.74 kg (178 lb), last menstrual period 05/27/2015, SpO2 97 %. Physical Exam  Constitutional: She is oriented to person, place, and time. She appears well-developed.  HENT:  Head: Normocephalic.  Eyes: EOM are normal.  Neck: Normal range of motion.  Cardiovascular: Normal rate, normal heart sounds and intact distal pulses.   Respiratory: Effort normal.  GI: Soft.  Genitourinary:  deferred  Musculoskeletal:  Right knee pain  Neurological: She is alert and oriented to person, place, and time.  Skin: Skin is warm and dry.  Psychiatric: Her behavior is normal.     Assessment/Plan Right knee OCD Right knee scope as consented D/c home today F/u in office Follow instrcutions  STILWELL, BRYSON L 06/11/2015, 1:25 PM

## 2015-06-12 ENCOUNTER — Encounter (HOSPITAL_BASED_OUTPATIENT_CLINIC_OR_DEPARTMENT_OTHER): Payer: Self-pay | Admitting: Specialist

## 2015-06-12 NOTE — Op Note (Signed)
NAME:  Shelly [name], Shelly [name]             ACCOUNT NO.:  [id number]  MEDICAL RECORD NO.:  [id number]  LOCATION:                               FACILITY:  [facility]  PHYSICIAN:  Erasmo Leventhalobert Andrew Cherokee Boccio, M.D.DATE OF BIRTH:  1971/03/26  DATE OF PROCEDURE:  06/11/2015 DATE OF DISCHARGE:  06/11/2015                              OPERATIVE REPORT   PREOPERATIVE DIAGNOSES:  Right knee osteochondritis dissecans, medial femoral condyle plus osteochondral loose body.  POSTOPERATIVE DIAGNOSES:  Right knee osteochondritis dissecans, medial femoral condyle plus osteochondral loose body plus grade 3 chondromalacia of patella and grade 2 changes of chondromalacia of medial tibial plateau.  PROCEDURE:  Right knee arthroscopic removal of osteochondral loose body and chondroplasty, medial femoral condyle and patella.  SURGEON:  Erasmo Leventhalobert Andrew Eisen Robenson, M.D.  ASSISTANT:  Marciano SequinBryson Stillwell, PA-C.  ANESTHESIA:  General with intraoperative knee block.  ESTIMATED BLOOD LOSS:  Minimal.  DRAINS:  None.  COMPLICATIONS:  None.  TOURNIQUET TIME:  24 minutes at 250 mmHg.  DISPOSITION:  PACU, stable.  OPERATIVE DETAILS:  The patient was counseled in the holding area. Correct site was identified.  IV was started and antibiotics were given. She was then taken to the operating room, she was placed in the supine position.  I had marked and signed the correct site in the holding area. She was placed under general anesthesia.  Right thigh placed in thigh holder.  Prepped with DuraPrep, draped in sterile fashion.  Time-out was done confirming the right side.  Exsanguinated with an Esmarch. Tourniquet inflated to 300 mmHg.  Arthroscopic portal was established, proximal medial, inferomedial, and inferolateral.  Diagnostic arthroscopy revealed suprapatellar pouch, lateral gutter unremarkable. There was osteochondral loose body in the medial gutter.  It was grasped and removed by __________ inferomedial portal site.  This was  a large osteochondral loose body, appeared to be chronic in nature.  I then thoroughly inspected the entire knee joint multiple times, suprapatellar pouch, medial and lateral gutters, intercondylar notch, posteromedial, posterolateral, and there were no more loose bodies.  There was an enlarged osteophyte on the inferior aspect of the patella, which initially was thought to be a loose body.  However, it was firmly attached to the patella that was not loose at all and __________ this was an extension of an osteophyte from the arthritic patella.  At that point in time, the remainder of the knee joint was looked at.  Femoral trochlea was unremarkable.  ACL and PCL were intact.  Lateral side was inspected.  __________ lateral meniscus.  Medial side was inspected. The medial meniscus was intact.  Medial tibial plateau did show grade 2 change of chondromalacia.  Chondroplasty was performed  __________ mechanical shaver.  The defect in the medial femoral condyle was chronic in nature and old.  It was 3 cm medial to lateral, 4 cm proximal to distal.  This pattern comes in the majority of the medial femoral condyle articulating weightbearing surface.  At this point in time, it was opted to abort any type of cartilage harvest __________ to be appropriate procedure for her and __________ discuss options with her later.  Irrigated and arthroscopic equipment was removed.  Portals were closed with 4-0  nylon suture.  A 20 mL of 0.25% Sensorcaine and 4 mg morphine sulfate was injected in the knee joint.  A 10 mL of 0.25% Sensorcaine was placed in skin edges.  Sterile dressing applied __________ ice pack, TED hose.  There were no complications or problems. Tourniquet had been deflated.  Normal circulation in foot and ankle at the end of the case.  She was then awakened and taken from the operative room to PACU in stable condition.  She will be stabilized in PACU and discharged to home.           ______________________________ Tiron Suski Erasmo LeventhalD.     RAC/MEDQ  D:  06/11/2015  T:  06/11/2015  Job:  [id number]

## 2015-08-07 NOTE — H&P (Signed)
UNICOMPARTMENTAL KNEE ADMISSION H&P  Patient is being admitted for right medial unicompartmental knee arthroplasty.  Subjective:  Chief Complaint:  Right knee medial compartmental primary OA /pain    HPI: Shelly [name], 44 y.o. female female, has a history of pain and functional disability in the right and has failed non-surgical conservative treatments for greater than 12 weeks to include NSAID's and/or analgesics and activity modification.  Onset of symptoms was gradual, starting years ago with gradually worsening course since that time. The patient noted no past surgery on the right knee(s).  Patient currently rates pain in the right knee(s) at 8 out of 10 with activity. Patient has worsening of pain with activity and weight bearing, pain that interferes with activities of daily living, pain with passive range of motion, crepitus and joint swelling.  Patient has evidence of periarticular osteophytes and joint space narrowing of the medial compartment by imaging studies.  There is no active infection.  Risks, benefits and expectations were discussed with the patient.  Risks including but not limited to the risk of anesthesia, blood clots, nerve damage, blood vessel damage, failure of the prosthesis, infection and up to and including death.  Patient understand the risks, benefits and expectations and wishes to proceed with surgery.   PCP: Rogelia Boga, MD  D/C Plans:      Home with HHPT  Post-op Meds:       No Rx given  Tranexamic Acid:      To be given - IV  Decadron:    It is to be given  FYI:     ASA post-op  Norco post-op    Past Medical History  Diagnosis Date  . GERD (gastroesophageal reflux disease)   . Depression   . Anxiety   . Asthma   . Loose body of right knee   . Osteochondral defect     right knee  . Intermittent palpitations      Past Surgical History  Procedure Laterality Date  . Wisdom tooth extraction Bilateral   . Cesarean section   06-03-2011//  09-27-2003//  2000    Bilateral Tubal Ligation w/ last c/s  . Knee arthroscopy Right 1990  . Left wrist reduction and fusion   07-19-2008    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  . Knee arthroscopy Right 06/11/2015    Procedure: RIGHT ARTHROSCOPY KNEE;  Surgeon: Eugenia Mcalpine, MD;  Location: [facility];  Service: Orthopedics;  Laterality: Right;  . Foreign body removal Right 06/11/2015    Procedure: RIGHT REMOVAL LOOSE BODY;  Surgeon: Eugenia Mcalpine, MD;  Location: [facility];  Service: Orthopedics;  Laterality: Right;  . Chondroplasty Right 06/11/2015    Procedure: CHONDROPLASTY;  Surgeon: Eugenia Mcalpine, MD;  Location: [facility];  Service: Orthopedics;  Laterality: Right;    No Known Allergies  Social History  Substance Use Topics  . Smoking status: Former Smoker -- 6 years    Types: Cigarettes    Quit date: 06/10/1995  . Smokeless tobacco: Never Used  . Alcohol Use: 0.0 oz/week    0 Standard drinks or equivalent per week     Comment: occasional    Family History  Problem Relation Age of Onset  . COPD Father   . Emphysema Father   . Arthritis Mother   . Alcohol abuse Paternal Grandmother   . Cancer Paternal Grandmother      Review of Systems  Constitutional: Negative.   HENT: Negative.   Eyes: Negative.  Respiratory: Negative.   Cardiovascular: Negative.   Gastrointestinal: Positive for heartburn.  Genitourinary: Negative.   Musculoskeletal: Positive for joint pain.  Skin: Negative.   Neurological: Negative.   Endo/Heme/Allergies: Negative.   Psychiatric/Behavioral: Positive for depression. The patient is nervous/anxious.      Objective:   Physical Exam  Constitutional: She is oriented to person, place, and time and well-developed, well-nourished, and in no distress.  HENT:  Head: Normocephalic and atraumatic.  Eyes: Pupils are equal, round, and reactive to light.  Neck: Neck supple. No JVD present. No  tracheal deviation present. No thyromegaly present.  Cardiovascular: Normal rate, regular rhythm, normal heart sounds and intact distal pulses.   Pulmonary/Chest: Effort normal and breath sounds normal. No stridor. No respiratory distress. She has no wheezes.  Abdominal: Soft. There is no tenderness. There is no guarding.  Musculoskeletal:       Right knee: She exhibits decreased range of motion, swelling and bony tenderness. She exhibits no ecchymosis, no deformity, no laceration and no erythema. Tenderness found. Medial joint line tenderness noted. No lateral joint line tenderness noted.  Lymphadenopathy:    She has no cervical adenopathy.  Neurological: She is alert and oriented to person, place, and time.  Skin: Skin is warm and dry.  Psychiatric: Affect normal.     Labs:  Estimated body mass index is 29.62 kg/(m^2) as calculated from the following:   Height as of 06/11/15: 5\' 5"  (1.651 m).   Weight as of 06/11/15: 80.74 kg (178 lb).   Imaging Review Plain radiographs demonstrate severe degenerative joint disease of the right knee(s) medial compartment. The overall alignment is neutral. The bone quality appears to be good for age and reported activity level.  Assessment/Plan:  End stage arthritis, right knee medial compartment  The patient history, physical examination, clinical judgment of the provider and imaging studies are consistent with end stage degenerative joint disease of the right knee(s) and medial unicompartmental knee arthroplasty is deemed medically necessary. The treatment options including medical management, injection therapy arthroscopy and arthroplasty were discussed at length. The risks and benefits of total knee arthroplasty were presented and reviewed. The risks due to aseptic loosening, infection, stiffness, patella tracking problems, thromboembolic complications and other imponderables were discussed. The patient acknowledged the explanation, agreed to proceed  with the plan and consent was signed. Patient is being admitted for outpatient / observation treatment for surgery, pain control, PT, OT, prophylactic antibiotics, VTE prophylaxis, progressive ambulation and ADL's and discharge planning. The patient is planning to be discharged home with home health services.     Anastasio Auerbach Avi Archuleta   PA-C  08/07/2015, 1:39 PM

## 2015-08-23 NOTE — Patient Instructions (Addendum)
21 Shelly [name]  08/23/2015   Your procedure is scheduled on:   09-02-2015 Monday  Enter through [facility]  Entrance and follow signs to [facility]. Arrive at   0645     AM..  (Limit 1 person with you).  Call this number if you have problems the morning of surgery: [phone]  Or Presurgical Testing [phone].   For Living Will and/or Health Care Power Attorney Forms: please provide copy for your medical record,may bring AM of surgery(Forms should be already notarized -we do not provide this service).(08-27-15  No information preferred today).    Do not eat food/ or drink: After Midnight.      Take these medicines the morning of surgery with A SIP OF WATER-   (DO NOT TAKE ANY DIABETIC MEDS AM OF SURGERY) : Use/bring Inhalers.   Do not wear jewelry, make-up or nail polish.  Do not wear deodorant, lotions, powders, or perfumes.   Do not shave legs and under arms- 48 hours(2 days) prior to first CHG shower.(Shaving face and neck okay.)  Do not bring valuables to the hospital.(Hospital is not responsible for lost valuables).  Contacts, dentures or removable bridgework, body piercing, hair pins may not be worn into surgery.  Leave suitcase in the car. After surgery it may be brought to your room.  For patients admitted to the hospital, checkout time is 11:00 AM the day of discharge.(Restricted visitors-Any Persons displaying flu-like symptoms or illness).    Patients discharged the day of surgery will not be allowed to drive home. Must have responsible person with you x 24 hours once discharged.  Name and phone number of your driver: spouse Shelly [name] - [phone] cell     Please read over the following fact sheets that you were given:  CHG(Chlorhexidine Gluconate 4% Surgical Soap) use, MRSA Information, Blood Transfusion fact sheet, Incentive Spirometry Instruction.  Remember : Type/Screen "Blue armbands" - may not be removed once applied(would result in being retested  AM of surgery, if removed).         [place] - Preparing for Surgery Before surgery, you can play an important role.  Because skin is not sterile, your skin needs to be as free of germs as possible.  You can reduce the number of germs on your skin by washing with CHG (chlorahexidine gluconate) soap before surgery.  CHG is an antiseptic cleaner which kills germs and bonds with the skin to continue killing germs even after washing. Please DO NOT use if you have an allergy to CHG or antibacterial soaps.  If your skin becomes reddened/irritated stop using the CHG and inform your nurse when you arrive at Short Stay. Do not shave (including legs and underarms) for at least 48 hours prior to the first CHG shower.  You may shave your face/neck. Please follow these instructions carefully:  1.  Shower with CHG Soap the night before surgery and the  morning of Surgery.  2.  If you choose to wash your hair, wash your hair first as usual with your  normal  shampoo.  3.  After you shampoo, rinse your hair and body thoroughly to remove the  shampoo.                           4.  Use CHG as you would any other liquid soap.  You can apply chg directly  to the skin and wash  Gently with a scrungie or clean washcloth.  5.  Apply the CHG Soap to your body ONLY FROM THE NECK DOWN.   Do not use on face/ open                           Wound or open sores. Avoid contact with eyes, ears mouth and genitals (private parts).                       Wash face,  Genitals (private parts) with your normal soap.             6.  Wash thoroughly, paying special attention to the area where your surgery  will be performed.  7.  Thoroughly rinse your body with warm water from the neck down.  8.  DO NOT shower/wash with your normal soap after using and rinsing off  the CHG Soap.                9.  Pat yourself dry with a clean towel.            10.  Wear clean pajamas.            11.  Place clean sheets on your  bed the night of your first shower and do not  sleep with pets. Day of Surgery : Do not apply any lotions/deodorants the morning of surgery.  Please wear clean clothes to the hospital/surgery center.  FAILURE TO FOLLOW THESE INSTRUCTIONS MAY RESULT IN THE CANCELLATION OF YOUR SURGERY PATIENT SIGNATURE_________________________________  NURSE SIGNATURE__________________________________  ________________________________________________________________________   [name]  An incentive spirometer is a tool that can help keep your lungs clear and active. This tool measures how well you are filling your lungs with each breath. Taking long deep breaths may help reverse or decrease the chance of developing breathing (pulmonary) problems (especially infection) following:  A long period of time when you are unable to move or be active. BEFORE THE PROCEDURE   If the spirometer includes an indicator to show your best effort, your nurse or respiratory therapist will set it to a desired goal.  If possible, sit up straight or lean slightly forward. Try not to slouch.  Hold the incentive spirometer in an upright position. INSTRUCTIONS FOR USE   Sit on the edge of your bed if possible, or sit up as far as you can in bed or on a chair.  Hold the incentive spirometer in an upright position.  Breathe out normally.  Place the mouthpiece in your mouth and seal your lips tightly around it.  Breathe in slowly and as deeply as possible, raising the piston or the ball toward the top of the column.  Hold your breath for 3-5 seconds or for as long as possible. Allow the piston or ball to fall to the bottom of the column.  Remove the mouthpiece from your mouth and breathe out normally.  Rest for a few seconds and repeat Steps 1 through 7 at least 10 times every 1-2 hours when you are awake. Take your time and take a few normal breaths between deep breaths.  The spirometer may include an  indicator to show your best effort. Use the indicator as a goal to work toward during each repetition.  After each set of 10 deep breaths, practice coughing to be sure your lungs are clear. If you have an incision (the cut made at the time of  surgery), support your incision when coughing by placing a pillow or rolled up towels firmly against it. Once you are able to get out of bed, walk around indoors and cough well. You may stop using the incentive spirometer when instructed by your caregiver.  RISKS AND COMPLICATIONS  Take your time so you do not get dizzy or light-headed.  If you are in pain, you may need to take or ask for pain medication before doing incentive spirometry. It is harder to take a deep breath if you are having pain. AFTER USE  Rest and breathe slowly and easily.  It can be helpful to keep track of a log of your progress. Your caregiver can provide you with a simple table to help with this. If you are using the spirometer at home, follow these instructions: [place] IF:   You are having difficultly using the spirometer.  You have trouble using the spirometer as often as instructed.  Your pain medication is not giving enough relief while using the spirometer.  You develop fever of 100.5 F (38.1 C) or higher. SEEK IMMEDIATE MEDICAL CARE IF:   You cough up bloody sputum that had not been present before.  You develop fever of 102 F (38.9 C) or greater.  You develop worsening pain at or near the incision site. MAKE SURE YOU:   Understand these instructions.  Will watch your condition.  Will get help right away if you are not doing well or get worse. Document Released: 03/22/2007 Document Revised: 02/01/2012 Document Reviewed: 05/23/2007 Exit[employer] Patient Information 2014 [employer], [place].   ________________________________________________________________________  WHAT IS A BLOOD TRANSFUSION? Blood Transfusion Information  A transfusion is the  replacement of blood or some of its parts. Blood is made up of multiple cells which provide different functions.  Red blood cells carry oxygen and are used for blood loss replacement.  White blood cells fight against infection.  Platelets control bleeding.  Plasma helps clot blood.  Other blood products are available for specialized needs, such as hemophilia or other clotting disorders. BEFORE THE TRANSFUSION  Who gives blood for transfusions?   Healthy volunteers who are fully evaluated to make sure their blood is safe. This is blood bank blood. Transfusion therapy is the safest it has ever been in the practice of medicine. Before blood is taken from a donor, a complete history is taken to make sure that person has no history of diseases nor engages in risky social behavior (examples are intravenous drug use or sexual activity with multiple partners). The donor's travel history is screened to minimize risk of transmitting infections, such as malaria. The donated blood is tested for signs of infectious diseases, such as HIV and hepatitis. The blood is then tested to be sure it is compatible with you in order to minimize the chance of a transfusion reaction. If you or a relative donates blood, this is often done in anticipation of surgery and is not appropriate for emergency situations. It takes many days to process the donated blood. RISKS AND COMPLICATIONS Although transfusion therapy is very safe and saves many lives, the main dangers of transfusion include:   Getting an infectious disease.  Developing a transfusion reaction. This is an allergic reaction to something in the blood you were given. Every precaution is taken to prevent this. The decision to have a blood transfusion has been considered carefully by your caregiver before blood is given. Blood is not given unless the benefits outweigh the risks. AFTER THE TRANSFUSION  Right after receiving a blood transfusion, you will usually  feel much better and more energetic. This is especially true if your red blood cells have gotten low (anemic). The transfusion raises the level of the red blood cells which carry oxygen, and this usually causes an energy increase.  The nurse administering the transfusion will monitor you carefully for complications. HOME CARE INSTRUCTIONS  No special instructions are needed after a transfusion. You may find your energy is better. Speak with your caregiver about any limitations on activity for underlying diseases you may have. SEEK MEDICAL CARE IF:   Your condition is not improving after your transfusion.  You develop redness or irritation at the intravenous (IV) site. SEEK IMMEDIATE MEDICAL CARE IF:  Any of the following symptoms occur over the next 12 hours:  Shaking chills.  You have a temperature by mouth above 102 F (38.9 C), not controlled by medicine.  Chest, back, or muscle pain.  People around you feel you are not acting correctly or are confused.  Shortness of breath or difficulty breathing.  Dizziness and fainting.  You get a rash or develop hives.  You have a decrease in urine output.  Your urine turns a dark color or changes to pink, red, or brown. Any of the following symptoms occur over the next 10 days:  You have a temperature by mouth above 102 F (38.9 C), not controlled by medicine.  Shortness of breath.  Weakness after normal activity.  The white part of the eye turns yellow (jaundice).  You have a decrease in the amount of urine or are urinating less often.  Your urine turns a dark color or changes to pink, red, or brown. Document Released: 11/06/2000 Document Revised: 02/01/2012 Document Reviewed: 06/25/2008 [facility] Patient Information 2014 [place], [place].  _______________________________________________________________________

## 2015-08-27 ENCOUNTER — Encounter (HOSPITAL_COMMUNITY): Payer: Self-pay

## 2015-08-27 ENCOUNTER — Encounter (HOSPITAL_COMMUNITY)
Admission: RE | Admit: 2015-08-27 | Discharge: 2015-08-27 | Disposition: A | Discharge status: Home / Self Care | Payer: 59 | Source: Ambulatory Visit | Attending: Orthopedic Surgery | Admitting: Orthopedic Surgery

## 2015-08-27 DIAGNOSIS — M179 Osteoarthritis of knee, unspecified: Secondary | ICD-10-CM | POA: Insufficient documentation

## 2015-08-27 DIAGNOSIS — Z01818 Encounter for other preprocedural examination: Secondary | ICD-10-CM | POA: Insufficient documentation

## 2015-08-27 LAB — URINALYSIS, ROUTINE W REFLEX MICROSCOPIC
Bilirubin Urine: NEGATIVE
GLUCOSE, UA: NEGATIVE mg/dL
Hgb urine dipstick: NEGATIVE
KETONES UR: NEGATIVE mg/dL
LEUKOCYTES UA: NEGATIVE
NITRITE: NEGATIVE
PH: 6 (ref 5.0–8.0)
Protein, ur: NEGATIVE mg/dL
SPECIFIC GRAVITY, URINE: 1.021 (ref 1.005–1.030)
Urobilinogen, UA: 1 mg/dL (ref 0.0–1.0)

## 2015-08-27 LAB — CBC
HCT: 40.6 % (ref 36.0–46.0)
HEMOGLOBIN: 13.4 g/dL (ref 12.0–15.0)
MCH: 29.6 pg (ref 26.0–34.0)
MCHC: 33 g/dL (ref 30.0–36.0)
MCV: 89.8 fL (ref 78.0–100.0)
PLATELETS: 282 10*3/uL (ref 150–400)
RBC: 4.52 MIL/uL (ref 3.87–5.11)
RDW: 13.5 % (ref 11.5–15.5)
WBC: 6.8 10*3/uL (ref 4.0–10.5)

## 2015-08-27 LAB — BASIC METABOLIC PANEL
ANION GAP: 5 (ref 5–15)
BUN: 8 mg/dL (ref 6–20)
CALCIUM: 9.4 mg/dL (ref 8.9–10.3)
CO2: 28 mmol/L (ref 22–32)
CREATININE: 0.77 mg/dL (ref 0.44–1.00)
Chloride: 106 mmol/L (ref 101–111)
GFR calc Af Amer: >60 mL/min (ref >60)
GLUCOSE: 95 mg/dL (ref 65–99)
Potassium: 3.8 mmol/L (ref 3.5–5.1)
Sodium: 139 mmol/L (ref 135–145)

## 2015-08-27 LAB — SURGICAL PCR SCREEN
MRSA, PCR: NEGATIVE
Staphylococcus aureus: NEGATIVE

## 2015-08-27 LAB — PROTIME-INR
INR: 1.09 (ref 0.00–1.49)
PROTHROMBIN TIME: 14.3 s (ref 11.6–15.2)

## 2015-08-27 LAB — APTT: APTT: 29 s (ref 24–37)

## 2015-08-27 LAB — HCG, SERUM, QUALITATIVE: Preg, Serum: NEGATIVE

## 2015-08-27 LAB — ABO/RH: ABO/RH(D): A POS

## 2015-09-02 ENCOUNTER — Observation Stay (HOSPITAL_COMMUNITY)
Admission: RE | Admit: 2015-09-02 | Discharge: 2015-09-03 | Disposition: A | Discharge status: Home / Self Care | Payer: 59 | Source: Ambulatory Visit | Attending: Orthopedic Surgery | Admitting: Orthopedic Surgery

## 2015-09-02 ENCOUNTER — Encounter (HOSPITAL_COMMUNITY): Payer: Self-pay | Admitting: *Deleted

## 2015-09-02 ENCOUNTER — Ambulatory Visit (HOSPITAL_COMMUNITY): Payer: 59 | Admitting: Anesthesiology

## 2015-09-02 ENCOUNTER — Encounter (HOSPITAL_COMMUNITY)
Admission: RE | Disposition: A | Discharge status: Home / Self Care | Payer: Self-pay | Source: Ambulatory Visit | Attending: Orthopedic Surgery

## 2015-09-02 DIAGNOSIS — K219 Gastro-esophageal reflux disease without esophagitis: Secondary | ICD-10-CM | POA: Diagnosis not present

## 2015-09-02 DIAGNOSIS — F419 Anxiety disorder, unspecified: Secondary | ICD-10-CM | POA: Insufficient documentation

## 2015-09-02 DIAGNOSIS — Z87891 Personal history of nicotine dependence: Secondary | ICD-10-CM | POA: Diagnosis not present

## 2015-09-02 DIAGNOSIS — F329 Major depressive disorder, single episode, unspecified: Secondary | ICD-10-CM | POA: Insufficient documentation

## 2015-09-02 DIAGNOSIS — M1711 Unilateral primary osteoarthritis, right knee: Secondary | ICD-10-CM | POA: Diagnosis not present

## 2015-09-02 DIAGNOSIS — Z96659 Presence of unspecified artificial knee joint: Secondary | ICD-10-CM

## 2015-09-02 DIAGNOSIS — M21861 Other specified acquired deformities of right lower leg: Secondary | ICD-10-CM | POA: Diagnosis not present

## 2015-09-02 DIAGNOSIS — R002 Palpitations: Secondary | ICD-10-CM | POA: Insufficient documentation

## 2015-09-02 DIAGNOSIS — Z96651 Presence of right artificial knee joint: Secondary | ICD-10-CM

## 2015-09-02 HISTORY — PX: PARTIAL KNEE ARTHROPLASTY: SHX2174

## 2015-09-02 LAB — TYPE AND SCREEN
ABO/RH(D): A POS
Antibody Screen: NEGATIVE

## 2015-09-02 SURGERY — ARTHROPLASTY, KNEE, UNICOMPARTMENTAL
Anesthesia: Spinal | Site: Knee | Laterality: Right

## 2015-09-02 MED ORDER — DEXAMETHASONE SODIUM PHOSPHATE 10 MG/ML IJ SOLN
10.0000 mg | Freq: Once | INTRAMUSCULAR | Status: AC
  Administered 2015-09-02: 10 mg via INTRAVENOUS

## 2015-09-02 MED ORDER — HYDROCODONE-ACETAMINOPHEN 7.5-325 MG PO TABS: 1.0000 | ORAL_TABLET | ORAL | Status: DC | PRN

## 2015-09-02 MED ORDER — LACTATED RINGERS IV SOLN
INTRAVENOUS | Status: DC
  Administered 2015-09-02: 12:00:00 via INTRAVENOUS
  Administered 2015-09-02: 1000 mL via INTRAVENOUS
  Administered 2015-09-02 (×2): via INTRAVENOUS

## 2015-09-02 MED ORDER — PROPOFOL 10 MG/ML IV BOLUS
INTRAVENOUS | Status: DC | PRN
  Administered 2015-09-02 (×2): 40 mg via INTRAVENOUS

## 2015-09-02 MED ORDER — FENTANYL CITRATE (PF) 100 MCG/2ML IJ SOLN
INTRAMUSCULAR | Status: AC
  Filled 2015-09-02: qty 2

## 2015-09-02 MED ORDER — ONDANSETRON HCL 4 MG/2ML IJ SOLN
INTRAMUSCULAR | Status: AC
  Filled 2015-09-02: qty 2

## 2015-09-02 MED ORDER — METHOCARBAMOL 500 MG PO TABS: 500.0000 mg | ORAL_TABLET | Freq: Four times a day (QID) | ORAL | Status: DC | PRN

## 2015-09-02 MED ORDER — TRANEXAMIC ACID 1000 MG/10ML IV SOLN
1000.0000 mg | Freq: Once | INTRAVENOUS | Status: AC
  Administered 2015-09-02: 1000 mg via INTRAVENOUS
  Filled 2015-09-02: qty 10

## 2015-09-02 MED ORDER — HYDROMORPHONE HCL 2 MG PO TABS: 2.0000 mg | ORAL_TABLET | Freq: Three times a day (TID) | ORAL | Status: DC | PRN

## 2015-09-02 MED ORDER — FENTANYL CITRATE (PF) 100 MCG/2ML IJ SOLN
50.0000 ug | INTRAMUSCULAR | Status: DC | PRN
  Administered 2015-09-02: 100 ug via INTRAVENOUS

## 2015-09-02 MED ORDER — HYDROCODONE-ACETAMINOPHEN 7.5-325 MG PO TABS
1.0000 | ORAL_TABLET | ORAL | Status: DC | PRN
  Administered 2015-09-02 (×2): 1 via ORAL
  Administered 2015-09-03: 2 via ORAL
  Filled 2015-09-02 (×2): qty 1
  Filled 2015-09-02: qty 2

## 2015-09-02 MED ORDER — PROPOFOL 10 MG/ML IV BOLUS
INTRAVENOUS | Status: AC
  Filled 2015-09-02: qty 20

## 2015-09-02 MED ORDER — MIDAZOLAM HCL 5 MG/5ML IJ SOLN
INTRAMUSCULAR | Status: DC | PRN
  Administered 2015-09-02: 2 mg via INTRAVENOUS

## 2015-09-02 MED ORDER — DEXAMETHASONE SODIUM PHOSPHATE 10 MG/ML IJ SOLN
INTRAMUSCULAR | Status: AC
  Filled 2015-09-02: qty 1

## 2015-09-02 MED ORDER — MIDAZOLAM HCL 2 MG/2ML IJ SOLN
INTRAMUSCULAR | Status: AC
  Filled 2015-09-02: qty 4

## 2015-09-02 MED ORDER — BUPIVACAINE-EPINEPHRINE 0.25% -1:200000 IJ SOLN
INTRAMUSCULAR | Status: DC | PRN
  Administered 2015-09-02: 30 mL

## 2015-09-02 MED ORDER — ONDANSETRON HCL 4 MG PO TABS: 4.0000 mg | ORAL_TABLET | Freq: Three times a day (TID) | ORAL | Status: DC | PRN

## 2015-09-02 MED ORDER — KETOROLAC TROMETHAMINE 30 MG/ML IJ SOLN
INTRAMUSCULAR | Status: AC
  Filled 2015-09-02: qty 1

## 2015-09-02 MED ORDER — PROMETHAZINE HCL 25 MG/ML IJ SOLN
6.2500 mg | INTRAMUSCULAR | Status: DC | PRN
Start: 2015-09-02 — End: 2015-09-02

## 2015-09-02 MED ORDER — KETOROLAC TROMETHAMINE 30 MG/ML IJ SOLN
INTRAMUSCULAR | Status: DC | PRN
  Administered 2015-09-02: 30 mg

## 2015-09-02 MED ORDER — CEFAZOLIN SODIUM-DEXTROSE 2-3 GM-% IV SOLR
INTRAVENOUS | Status: AC
Start: 2015-09-02 — End: 2015-09-02
  Filled 2015-09-02: qty 50

## 2015-09-02 MED ORDER — HYDROMORPHONE HCL 1 MG/ML IJ SOLN: 0.2500 mg | INTRAMUSCULAR | Status: DC | PRN

## 2015-09-02 MED ORDER — CEPHALEXIN 500 MG PO CAPS: 500.0000 mg | ORAL_CAPSULE | Freq: Three times a day (TID) | ORAL | Status: DC

## 2015-09-02 MED ORDER — MEPERIDINE HCL 50 MG/ML IJ SOLN
INTRAMUSCULAR | Status: AC
  Filled 2015-09-02: qty 1

## 2015-09-02 MED ORDER — PROPOFOL 500 MG/50ML IV EMUL
INTRAVENOUS | Status: DC | PRN
  Administered 2015-09-02: 100 ug/kg/min via INTRAVENOUS

## 2015-09-02 MED ORDER — ONDANSETRON HCL 4 MG/2ML IJ SOLN
INTRAMUSCULAR | Status: DC | PRN
  Administered 2015-09-02: 4 mg via INTRAVENOUS

## 2015-09-02 MED ORDER — CHLORHEXIDINE GLUCONATE 4 % EX LIQD: 60.0000 mL | Freq: Once | CUTANEOUS | Status: DC

## 2015-09-02 MED ORDER — CEFAZOLIN SODIUM-DEXTROSE 2-3 GM-% IV SOLR
2.0000 g | INTRAVENOUS | Status: AC
  Administered 2015-09-02: 2 g via INTRAVENOUS

## 2015-09-02 MED ORDER — BUPIVACAINE IN DEXTROSE 0.75-8.25 % IT SOLN
INTRATHECAL | Status: DC | PRN
  Administered 2015-09-02: 2 mL via INTRATHECAL

## 2015-09-02 MED ORDER — ASPIRIN EC 325 MG PO TBEC: 325.0000 mg | DELAYED_RELEASE_TABLET | Freq: Two times a day (BID) | ORAL | Status: DC

## 2015-09-02 MED ORDER — MEPERIDINE HCL 50 MG/ML IJ SOLN
6.2500 mg | INTRAMUSCULAR | Status: DC | PRN
  Administered 2015-09-02 (×2): 12.5 mg via INTRAVENOUS

## 2015-09-02 MED ORDER — FENTANYL CITRATE (PF) 100 MCG/2ML IJ SOLN
INTRAMUSCULAR | Status: AC
  Filled 2015-09-02: qty 4

## 2015-09-02 MED ORDER — SODIUM CHLORIDE 0.9 % IJ SOLN
INTRAMUSCULAR | Status: DC | PRN
  Administered 2015-09-02: 30 mL

## 2015-09-02 MED ORDER — FENTANYL CITRATE (PF) 250 MCG/5ML IJ SOLN
INTRAMUSCULAR | Status: DC | PRN
  Administered 2015-09-02 (×2): 50 ug via INTRAVENOUS

## 2015-09-02 MED ORDER — BUPIVACAINE-EPINEPHRINE (PF) 0.25% -1:200000 IJ SOLN
INTRAMUSCULAR | Status: AC
  Filled 2015-09-02: qty 30

## 2015-09-02 MED ORDER — SODIUM CHLORIDE 0.9 % IJ SOLN
INTRAMUSCULAR | Status: AC
  Filled 2015-09-02: qty 50

## 2015-09-02 SURGICAL SUPPLY — 50 items
BAG DECANTER FOR FLEXI CONT (MISCELLANEOUS) IMPLANT
BAG SPEC THK2 15X12 ZIP CLS (MISCELLANEOUS)
BAG ZIPLOCK 12X15 (MISCELLANEOUS) IMPLANT
BANDAGE ELASTIC 6 VELCRO ST LF (GAUZE/BANDAGES/DRESSINGS) ×2 IMPLANT
BANDAGE ESMARK 6X9 LF (GAUZE/BANDAGES/DRESSINGS) ×1 IMPLANT
BLADE SAW RECIPROCATING 77.5 (BLADE) ×2 IMPLANT
BLADE SAW SGTL 13.0X1.19X90.0M (BLADE) ×2 IMPLANT
BNDG CMPR 9X6 STRL LF SNTH (GAUZE/BANDAGES/DRESSINGS) ×1
BNDG ESMARK 6X9 LF (GAUZE/BANDAGES/DRESSINGS) ×2
BOWL SMART MIX CTS (DISPOSABLE) ×2 IMPLANT
CAPT KNEE PARTIAL 2 ×1 IMPLANT
CEMENT HV SMART SET (Cement) ×1 IMPLANT
CUFF TOURN SGL QUICK 34 (TOURNIQUET CUFF) ×2
CUFF TRNQT CYL 34X4X40X1 (TOURNIQUET CUFF) ×1 IMPLANT
DRAPE EXTREMITY T 121X128X90 (DRAPE) ×2 IMPLANT
DRAPE POUCH INSTRU U-SHP 10X18 (DRAPES) ×2 IMPLANT
DRAPE U-SHAPE 47X51 STRL (DRAPES) ×2 IMPLANT
DRSG AQUACEL AG ADV 3.5X10 (GAUZE/BANDAGES/DRESSINGS) ×2 IMPLANT
DURAPREP 26ML APPLICATOR (WOUND CARE) ×4 IMPLANT
ELECT REM PT RETURN 9FT ADLT (ELECTROSURGICAL) ×2
ELECTRODE REM PT RTRN 9FT ADLT (ELECTROSURGICAL) ×1 IMPLANT
FACESHIELD WRAPAROUND (MASK) ×8 IMPLANT
GLOVE BIOGEL PI IND STRL 7.5 (GLOVE) ×1 IMPLANT
GLOVE BIOGEL PI IND STRL 8.5 (GLOVE) ×1 IMPLANT
GLOVE BIOGEL PI INDICATOR 7.5 (GLOVE) ×7
GLOVE BIOGEL PI INDICATOR 8.5 (GLOVE) ×1
GLOVE ECLIPSE 8.0 STRL XLNG CF (GLOVE) ×2 IMPLANT
GLOVE ORTHO TXT STRL SZ7.5 (GLOVE) ×4 IMPLANT
GOWN SPEC L3 XXLG W/TWL (GOWN DISPOSABLE) ×2 IMPLANT
GOWN STRL REUS W/TWL LRG LVL3 (GOWN DISPOSABLE) ×6 IMPLANT
KIT BASIN OR (CUSTOM PROCEDURE TRAY) ×2 IMPLANT
LEGGING LITHOTOMY PAIR STRL (DRAPES) ×2 IMPLANT
LIQUID BAND (GAUZE/BANDAGES/DRESSINGS) ×2 IMPLANT
MANIFOLD NEPTUNE II (INSTRUMENTS) ×2 IMPLANT
NDL SAFETY ECLIPSE 18X1.5 (NEEDLE) ×1 IMPLANT
NEEDLE HYPO 18GX1.5 SHARP (NEEDLE) ×2
PACK TOTAL JOINT (CUSTOM PROCEDURE TRAY) ×2 IMPLANT
PEN SKIN MARKING BROAD (MISCELLANEOUS) ×2 IMPLANT
SUCTION FRAZIER 12FR DISP (SUCTIONS) ×2 IMPLANT
SUT MNCRL AB 4-0 PS2 18 (SUTURE) ×2 IMPLANT
SUT VIC AB 1 CT1 36 (SUTURE) ×2 IMPLANT
SUT VIC AB 2-0 CT1 27 (SUTURE) ×4
SUT VIC AB 2-0 CT1 TAPERPNT 27 (SUTURE) ×2 IMPLANT
SUT VLOC 180 0 24IN GS25 (SUTURE) ×2 IMPLANT
SYR 50ML LL SCALE MARK (SYRINGE) ×2 IMPLANT
TOWEL OR 17X26 10 PK STRL BLUE (TOWEL DISPOSABLE) ×2 IMPLANT
TOWEL OR NON WOVEN STRL DISP B (DISPOSABLE) ×2 IMPLANT
TRAY FOLEY W/METER SILVER 14FR (SET/KITS/TRAYS/PACK) ×1 IMPLANT
TRAY FOLEY W/METER SILVER 16FR (SET/KITS/TRAYS/PACK) IMPLANT
YANKAUER SUCT BULB TIP NO VENT (SUCTIONS) ×2 IMPLANT

## 2015-09-02 NOTE — Transfer of Care (Signed)
Immediate Anesthesia Transfer of Care Note  Patient: Shelly [name]  Procedure(s) Performed: Procedure(s): RIGHT UNI KNEE ARTHROPLASTY MEDIALLY (Right)  Patient Location: PACU  Anesthesia Type:MAC and Spinal  Level of Consciousness: sedated and patient cooperative  Airway & Oxygen Therapy: Patient Spontanous Breathing and Patient connected to face mask oxygen  Post-op Assessment: Report given to RN and Post -op Vital signs reviewed and stable  Post vital signs: Reviewed and stable  Last Vitals:  Filed Vitals:   09/02/15 0651  BP: 127/82  Pulse: 75  Temp: 36.6 C  Resp: 16    Complications: No apparent anesthesia complications

## 2015-09-02 NOTE — Discharge Instructions (Signed)

## 2015-09-02 NOTE — Progress Notes (Signed)
Pt continues to be numb in buttocks area and diminished feeling in thighs, unable to remove foley and ambulate. Admit order obtained from Dr. Charlann Boxer / Judie Petit. Babish, PA.  Pt transferred to 1608 via stretcher and bedside report given.

## 2015-09-02 NOTE — Anesthesia Procedure Notes (Signed)
Spinal Patient location during procedure: OR Staffing Anesthesiologist: Franne Grip Performed by: anesthesiologist  Preanesthetic Checklist Completed: patient identified, site marked, surgical consent, pre-op evaluation, timeout performed, IV checked, risks and benefits discussed and monitors and equipment checked Spinal Block Patient position: sitting Prep: Betadine Patient monitoring: heart rate, continuous pulse ox and blood pressure Approach: midline Location: L3-4 Injection technique: single-shot Needle Needle type: Sprotte  Needle gauge: 24 G Needle length: 9 cm Additional Notes Expiration date of kit checked and confirmed. Patient tolerated procedure well, without complications. CSF clear. No paresthesia.

## 2015-09-02 NOTE — Interval H&P Note (Signed)
History and Physical Interval Note:  09/02/2015 9:03 AM  Shelly [name]  has presented today for surgery, with the diagnosis of RIGHT MEDIAL COMPARTMENTAL OA  The various methods of treatment have been discussed with the patient and family. After consideration of risks, benefits and other options for treatment, the patient has consented to  Procedure(s): RIGHT UNI KNEE ARTHROPLASTY MEDIALLY (Right) as a surgical intervention .  The patient's history has been reviewed, patient examined, no change in status, stable for surgery.  I have reviewed the patient's chart and labs.  Questions were answered to the patient's satisfaction.     Shelda Pal

## 2015-09-02 NOTE — Anesthesia Preprocedure Evaluation (Addendum)
Anesthesia Evaluation  Patient identified by MRN, date of birth, ID band Patient awake    Reviewed: Allergy & Precautions, NPO status , Patient's Chart, lab work & pertinent test results  Airway Mallampati: II  TM Distance: >3 FB Neck ROM: Full    Dental no notable dental hx.    Pulmonary neg pulmonary ROS, former smoker,    Pulmonary exam normal breath sounds clear to auscultation       Cardiovascular negative cardio ROS Normal cardiovascular exam Rhythm:Regular Rate:Normal     Neuro/Psych negative neurological ROS  negative psych ROS   GI/Hepatic negative GI ROS, Neg liver ROS,   Endo/Other  negative endocrine ROS  Renal/GU negative Renal ROS  negative genitourinary   Musculoskeletal negative musculoskeletal ROS (+)   Abdominal   Peds negative pediatric ROS (+)  Hematology negative hematology ROS (+)   Anesthesia Other Findings   Reproductive/Obstetrics negative OB ROS                            Anesthesia Physical Anesthesia Plan  ASA: II  Anesthesia Plan: Spinal   Post-op Pain Management:    Induction: Intravenous  Airway Management Planned: Natural Airway  Additional Equipment:   Intra-op Plan:   Post-operative Plan:   Informed Consent: I have reviewed the patients History and Physical, chart, labs and discussed the procedure including the risks, benefits and alternatives for the proposed anesthesia with the patient or authorized representative who has indicated his/her understanding and acceptance.   Dental advisory given  Plan Discussed with: CRNA  Anesthesia Plan Comments: (Discussed risks and benefits of and differences between spinal and general. Discussed risks of spinal including headache, backache, failure, bleeding and hematoma, infection, and nerve damage. Patient consents to spinal. Questions answered. Coagulation studies and platelet count acceptable.   3  spinals in past for c-section without complication.   Very anxious today. Fentanyl given)        Anesthesia Quick Evaluation

## 2015-09-02 NOTE — Anesthesia Postprocedure Evaluation (Signed)
  Anesthesia Post-op Note  Patient: Shelly [name]  Procedure(s) Performed: Procedure(s) (LRB): RIGHT UNI KNEE ARTHROPLASTY MEDIALLY (Right)  Patient Location: PACU  Anesthesia Type: Spinal  Level of Consciousness: awake and alert   Airway and Oxygen Therapy: Patient Spontanous Breathing  Post-op Pain: mild  Post-op Assessment: Post-op Vital signs reviewed, Patient's Cardiovascular Status Stable, Respiratory Function Stable, Patent Airway and No signs of Nausea or vomiting. Purposeful movement both legs in PACU. Spinal receding.  Last Vitals:  Filed Vitals:   09/02/15 1410  BP: 114/64  Pulse: 43  Temp:   Resp: 10    Post-op Vital Signs: stable   Complications: No apparent anesthesia complications

## 2015-09-02 NOTE — Op Note (Signed)
NAME: Shelly [name]    MEDICAL RECORD NO.: [id number]   FACILITY: [facility]   DATE OF BIRTH: 1971/06/11  PHYSICIAN: Madlyn Frankel. Charlann Boxer, M.D.    DATE OF PROCEDURE: 09/02/2015    OPERATIVE REPORT   PREOPERATIVE DIAGNOSIS: right knee medial compartment osteoarthritis.   POSTOPERATIVE DIAGNOSIS: right knee medial compartment osteoarthritis.  PROCEDURE: Right partial knee replacement utilizing Biomet Oxford knee  component, size X-small femur, a right medial size A tibial tray with a size 4 mm insert.   SURGEON: Madlyn Frankel. Charlann Boxer, M.D.   ASSISTANT: Lanney Gins, PAC.  Please note that Mr. Carmon Sails was present for the entirety of the case,  utilized for preoperative positioning, perioperative retractor  management, general facilitation of the case and primary wound closure.   ANESTHESIA: Spinal.   SPECIMENS: None.   COMPLICATIONS: None.  DRAINS: None   TOURNIQUET TIME: 31 minutes at 250 mmHg.   INDICATIONS FOR PROCEDURE: The patient is a 44 y.o. patient of mine who presented for evaluation of right knee pain.  She presented with primary complaints of pain on the medial side of their knee. She had failed previous conservative treatment including arthroscopic surgery, injections, medications, and exercise.  Radiographs revealed advanced medial compartment arthritis with specifically an antero-medial wear pattern.  There was bone on bone changes noted with subchondral sclerosis and osteophytes present. The patient has had progressive problems failing to respond to conservative measures of medications, injections and activity modification. Risks of infection, DVT, component failure, need for future revision surgery were all discussed and reviewed.  Consent was obtained for benefit of pain relief.   PROCEDURE IN DETAIL: The patient was brought to the operative theater.  Once adequate anesthesia, preoperative antibiotics, 2 gm of Ancef, 1 gm of Tranexamic Acid, and 10 mg of Decadron  administered, the patient was positioned in supine position with a right thigh tourniquet  placed. The right lower extremity was prepped and draped in sterile  fashion with the leg on the Oxford leg holder.  The leg was allowed to flex to 120 degrees. A time-out  was performed identifying the patient, planned procedure, and extremity.  The leg was exsanguinated, tourniquet elevated to 250 mmHg. A midline  incision was made from the proximal pole of the patella to the tibial tubercle. A  soft tissue plane was created and partial median arthrotomy was then  made to allow for subluxation of the patella. Following initial synovectomy and  debridement, the osteophytes were removed off the medial aspect of the  knee.   Attention was first directed to the tibia. The tibial  extramedullary guide was positioned over the anterior crest of the tibia  and pinned into position, and using a measured resection guide from the  Oxford system, a 4 mm resection was made off the proximal tibia. First  the reciprocating saw along the medial aspect of the tibial spines, then the oscillating saw.    At this point, I sized this cut surface seem to be best fit for a size A tibial tray.  With the retractors out of the wound and the knee held at 90 degrees the 4 feeler gauge had appropriate tension on the medial ligament.   At this point, the femoral canal was opened with a drill and the  intramedullary rod passed. Then using the guide for a X-small mm resection off  the posterior aspect of the femur was positioned over the mid portion of the medial femoral condyle.  The orientation was set using the  guide that mates the femoral guide to the intramedullary rod.  The 2 drill holes were made into the distal femur.  The posterior guide was then impacted into place and the posterior  femoral cut made.  At this point, I milled the distal femur with a size 4 spigot in place. At this point, we did a trial reduction of the  X-small femur, size right medial A tibial tray and a size 4 feeler gauge. At 90 degrees of  flexion and at 20 degrees of flexion the knee had symmetric tension on  the ligaments.   Given these findings, the trial femoral component was removed. Final preparation of tibia was carried out by pinning it in position. Then  using a reciprocating saw I removed bone for the keel. Further bone was  removed with an osteotome.  Trial reduction was now carried out with the X-small femur, the right medial size A tibia, and the size 4 lollipop insert. The balance of the  ligaments appeared to be symmetric at 20 degrees and 90 degrees. Given  all these findings, the trial components were removed.   Cement was mixed. The final components were opened. The knee was irrigated with  normal saline solution. Then final debridements of the  soft tissue was carried out, I also drilled the sclerotic bone with a drill.  The final components were cemented with a single batch of cement in a  two-stage technique with the tibial component cemented first. The knee  was then brought  to 45 degrees of flexion with a 4 feeler gauge, held with pressure for a minute and half.  After this the femoral component was cemented in place.  The knee was again held at 45 degrees of flexion while the cement fully cured.  Excess cement was removed throughout the knee. Tourniquet was let down  after 31 minutes. After the cement had fully cured and excessive cement  was removed throughout the knee there was no visualized cement present.   The final right medial size 4 mm insert to match the X-small femur was chosen and snapped into position. We re-irrigated  the knee. The extensor mechanism  was then reapproximated using a #1 Vicryl and #0 V-lock sutures with the knee in flexion. The  remaining wound was closed with 2-0 Vicryl and a running 4-0 Monocryl.  The knee was cleaned, dried, and dressed sterilely using Dermabond and  Aquacel  dressing. The patient  was brought to the recovery room, Ace wrap in place, tolerating the  procedure well. He will be in the hospital for overnight observation.  We will initiate physical therapy and progress to ambulate.     Madlyn Frankel Charlann Boxer, M.D.

## 2015-09-03 DIAGNOSIS — M1711 Unilateral primary osteoarthritis, right knee: Secondary | ICD-10-CM | POA: Diagnosis not present

## 2015-09-03 MED ORDER — ASPIRIN EC 325 MG PO TBEC: 325.0000 mg | DELAYED_RELEASE_TABLET | Freq: Two times a day (BID) | ORAL | Status: AC

## 2015-09-03 MED ORDER — HYDROCODONE-ACETAMINOPHEN 7.5-325 MG PO TABS: 1.0000 | ORAL_TABLET | ORAL | Status: DC | PRN

## 2015-09-03 MED ORDER — METHOCARBAMOL 500 MG PO TABS: 500.0000 mg | ORAL_TABLET | Freq: Four times a day (QID) | ORAL | Status: DC | PRN

## 2015-09-03 MED ORDER — ONDANSETRON HCL 4 MG PO TABS: 4.0000 mg | ORAL_TABLET | Freq: Three times a day (TID) | ORAL | Status: DC | PRN

## 2015-09-03 NOTE — Evaluation (Signed)
Physical Therapy Evaluation Patient Details Name: Shelly [name] MRN: [id number] DOB: 1971-09-12 Today's Date: 09/03/2015   History of Present Illness  R UKR   Clinical Impression  Pt admitted with above diagnosis. Pt currently with functional limitations due to the deficits listed below (see PT Problem List). Pt will benefit from skilled PT to increase their independence and safety with mobility to allow discharge to the venue listed below.       Follow Up Recommendations Home health PT;Outpatient PT    Equipment Recommendations  Rolling walker with 5" wheels;3in1 (PT)    Recommendations for Other Services       Precautions / Restrictions Precautions Precautions: Fall Precaution Comments: buttocks and lateral feet numb Restrictions Weight Bearing Restrictions: No      Mobility  Bed Mobility Overal bed mobility: Needs Assistance Bed Mobility: Supine to Sit;Sit to Supine     Supine to sit: Mod assist     General bed mobility comments: needs support of the R leg, bed is quite high, patient does not have full control of the R leg  Transfers Overall transfer level: Needs assistance Equipment used: Rolling walker (2 wheeled)   Sit to Stand: +2 physical assistance;+2 safety/equipment;From elevated surface         General transfer comment: patient stands, L leg buckles at times, did not attempt to ambulate.  Ambulation/Gait                Stairs         General stair comments: verbally reviewed use of RW backwards and provided written  instruction. patient has crutsches which she says that she feels confident with.  Wheelchair Mobility    Modified Rankin (Stroke Patients Only)       Balance             Standing balance-Leahy Scale: Poor                               Pertinent Vitals/Pain      Home Living Family/patient expects to be discharged to:: Private residence Living Arrangements: Spouse/significant  other Available Help at Discharge: Family Type of Home: House Home Access: Level entry     Home Layout: Multi-level Home Equipment: Crutches      Prior Function Level of Independence: Independent               Hand Dominance        Extremity/Trunk Assessment                 RLE Deficits / Details: able to lift leg, knee flexion  30 degrees,  reports buttock and lateral foot numb LLE Deficits / Details: similar for sensation deficits, lifts leg well  Cervical / Trunk Assessment: Normal  Communication   Communication: No difficulties  Cognition Arousal/Alertness: Awake/alert Behavior During Therapy: WFL for tasks assessed/performed Overall Cognitive Status: Within Functional Limits for tasks assessed                      General Comments      Exercises Total Joint Exercises Ankle Circles/Pumps: AROM;Both;5 reps Quad Sets: AROM;Both;5 reps Heel Slides: AAROM;Right;5 reps Straight Leg Raises: AAROM;Right;5 reps      Assessment/Plan    PT Assessment Patient needs continued PT services  PT Diagnosis Difficulty walking   PT Problem List Decreased strength;Decreased range of motion;Decreased activity tolerance;Decreased mobility;Decreased balance;Decreased knowledge of use of DME;Decreased safety awareness;Decreased  knowledge of precautions;Impaired sensation;Pain  PT Treatment Interventions DME instruction;Gait training;Stair training;Functional mobility training;Therapeutic activities;Therapeutic exercise   PT Goals (Current goals can be found in the Care Plan section) Acute Rehab PT Goals Patient Stated Goal: to go home, feel my buttocks PT Goal Formulation: With patient Time For Goal Achievement: 09/04/15 Potential to Achieve Goals: Good    Frequency 7X/week   Barriers to discharge        Co-evaluation               End of Session Equipment Utilized During Treatment: Gait belt Activity Tolerance: Patient limited by  pain;Treatment limited secondary to medical complications (Comment) (legs remain numb and not deemed safe to stand and ambulate) Patient left: in bed;with call bell/phone within reach      Functional Assessment Tool Used: clinical judgement Functional Limitation: Mobility: Walking and moving around Mobility: Walking and Moving Around Current Status (Z6109): 100 percent impaired, limited or restricted Mobility: Walking and Moving Around Goal Status (U0454): At least 1 percent but less than 20 percent impaired, limited or restricted    Time:  -      Charges:         PT G Codes:   PT G-Codes **NOT FOR INPATIENT CLASS** Functional Assessment Tool Used: clinical judgement Functional Limitation: Mobility: Walking and moving around Mobility: Walking and Moving Around Current Status (U9811): 100 percent impaired, limited or restricted Mobility: Walking and Moving Around Goal Status (B1478): At least 1 percent but less than 20 percent impaired, limited or restricted     Eval placed into note section by Concha Norway performed by Blanchard Kelch, PT on 09/02/15 [facility] 09/03/2015, 9:09 AM

## 2015-09-03 NOTE — Plan of Care (Signed)
Problem: Consults Goal: Diagnosis- Total Joint Replacement Outcome: Completed/Met Date Met:  09/03/15 Partial/Uniknee RIGHT

## 2015-09-03 NOTE — Progress Notes (Signed)
Patient ID: Shelly [name], female   DOB: 05-19-1971, 44 y.o.   MRN: [id number] Subjective: 1 Day Post-Op Procedure(s) (LRB): RIGHT UNI KNEE ARTHROPLASTY MEDIALLY (Right)    Patient reports pain as mild, noting incisional pain mainly.  Numbness that persistent yesterday resulting her overnight stay   Objective:   VITALS:   Filed Vitals:   09/03/15 0708  BP: 121/64  Pulse: 86  Temp: 98.4 F (36.9 C)  Resp: 14    Neurovascular intact Incision: dressing C/D/I  LABS No results for input(s): HGB, HCT, WBC, PLT in the last 72 hours.  No results for input(s): NA, K, BUN, CREATININE, GLUCOSE in the last 72 hours.  No results for input(s): LABPT, INR in the last 72 hours.   Assessment/Plan: 1 Day Post-Op Procedure(s) (LRB): RIGHT UNI KNEE ARTHROPLASTY MEDIALLY (Right)   Advance diet Up with therapy   Home today Out patient therapy to be set up with our office [place] out this am due to persistent spinal effects yesterday

## 2015-09-03 NOTE — Progress Notes (Signed)
Patient is alert and oriented. VS stable. No s/s of acute distress. No PRN pain medication needed. Patient being discharged to home. Reviewed discharge information, medications and instructions. Patient states understanding. IV removed C/D/I.

## 2015-09-03 NOTE — Progress Notes (Signed)
   09/03/15 1000  PT Visit Information  Last PT Received On 09/03/15  Assistance Needed +1  History of Present Illness R UKR   PT Time Calculation  PT Start Time (ACUTE ONLY) 0935  PT Stop Time (ACUTE ONLY) 1005  PT Time Calculation (min) (ACUTE ONLY) 30 min  Subjective Data  Patient Stated Goal to go home, feel my buttocks  Precautions  Precautions Fall  Precaution Comments buttocks and lateral feet numb  Restrictions  Weight Bearing Restrictions No  Pain Assessment  Pain Assessment 0-10  Pain Score 1  Pain Location R knee  Pain Descriptors / Indicators Tightness  Pain Intervention(s) Limited activity within patient's tolerance;Monitored during session;Ice applied  Cognition  Arousal/Alertness Awake/alert  Behavior During Therapy WFL for tasks assessed/performed  Overall Cognitive Status Within Functional Limits for tasks assessed  Bed Mobility  Overal bed mobility Needs Assistance  Bed Mobility Supine to Sit  Supine to sit Min guard;Min assist  General bed mobility comments assist with RLE  Transfers  Overall transfer level Needs assistance  Equipment used Rolling walker (2 wheeled)  Transfers Sit to/from Stand  Sit to Stand Supervision  General transfer comment cues for hand placement  Ambulation/Gait  Ambulation/Gait assistance Supervision  Ambulation Distance (Feet) 100 Feet  Assistive device Rolling walker (2 wheeled)  Gait Pattern/deviations Step-to pattern  General Gait Details cues for RW position  General stair comments discussed technique with RW and crutches, pt feels confortable and does not want to practice  Balance  Standing balance-Leahy Scale Fair  Total Joint Exercises  Ankle Circles/Pumps AROM;Both;10 reps  Quad Sets AROM;Both;10 reps  Heel Slides AROM;AAROM;Right;10 reps  Straight Leg Raises AAROM;Strengthening;Right;10 reps  Knee Flexion AAROM;Right;5 reps;Seated  PT - End of Session  Equipment Utilized During Treatment Gait belt  Activity  Tolerance Patient tolerated treatment well  Patient left Other (comment);with call bell/phone within reach (in bathroom)  Nurse Communication Mobility status  PT - Assessment/Plan  PT Plan Current plan remains appropriate  PT Frequency (ACUTE ONLY) 7X/week  Follow Up Recommendations Home health PT;Outpatient PT  PT equipment Rolling walker with 5" wheels;3in1 (PT)  PT Goal Progression  Progress towards PT goals Progressing toward goals  Acute Rehab PT Goals  PT Goal Formulation With patient  Time For Goal Achievement 09/04/15  Potential to Achieve Goals Good  PT G-Codes **NOT FOR INPATIENT CLASS**  Mobility: Walking and Moving Around Goal Status (Z6109) CI  Mobility: Walking and Moving Around Discharge Status (U0454) CI  PT General Charges  $$ ACUTE PT VISIT 1 Procedure  PT Treatments  $Gait Training 8-22 mins  $Therapeutic Exercise 8-22 mins

## 2015-09-03 NOTE — Care Management Note (Signed)
Case Management Note  Patient Details  Name: Shelly [name] MRN: [id number] Date of Birth: February 07, 1971  Subjective/Objective:                   RIGHT UNI KNEE ARTHROPLASTY MEDIALLY (Right) Action/Plan:  Discharge planning Expected Discharge Date:  09/03/15               Expected Discharge Plan:  Home/Self Care  In-House Referral:     Discharge planning Services  CM Consult  Post Acute Care Choice:    Choice offered to:  Patient  DME Arranged:  3-N-1, Walker rolling DME Agency:  [place]:    [place]:     Status of Service:  Completed, signed off  Medicare Important Message Given:    Date Medicare IM Given:    Medicare IM give by:    Date Additional Medicare IM Given:    Additional Medicare Important Message give by:     If discussed at [place] of Stay Meetings, dates discussed:    Additional Comments: CM met with pt to confirm:  pt will be calling MD office to arrange outpt therapy.  Pt has rolling walker and 3n1 in room.  No other CM needs were communicated.   Dellie Catholic, RN 09/03/2015, 12:41 PM

## 2015-09-09 NOTE — Discharge Summary (Signed)
Physician Discharge Summary  Patient ID: Shelly [name] MRN: [id number] DOB/AGE: 03-26-1971 44 y.o.  Admit date: 09/02/2015 Discharge date: 09/03/2015   Procedures:  Procedure(s) (LRB): RIGHT UNI KNEE ARTHROPLASTY MEDIALLY (Right)  Attending Physician:  Dr. Durene RomansMatthew Olin   Admission Diagnoses:   Right knee medial compartmental primary OA /pain  Discharge Diagnoses:  Principal Problem:   S/P right UKR Active Problems:   Status post unilateral knee replacement  Past Medical History  Diagnosis Date  . GERD (gastroesophageal reflux disease)   . Depression   . Anxiety   . Asthma   . Loose body of right knee   . Osteochondral defect     right knee  . Intermittent palpitations     HPI:    Shelly [name], 44 y.o. female female, has a history of pain and functional disability in the right and has failed non-surgical conservative treatments for greater than 12 weeks to include NSAID's and/or analgesics and activity modification. Onset of symptoms was gradual, starting years ago with gradually worsening course since that time. The patient noted no past surgery on the right knee(s). Patient currently rates pain in the right knee(s) at 8 out of 10 with activity. Patient has worsening of pain with activity and weight bearing, pain that interferes with activities of daily living, pain with passive range of motion, crepitus and joint swelling. Patient has evidence of periarticular osteophytes and joint space narrowing of the medial compartment by imaging studies. There is no active infection. Risks, benefits and expectations were discussed with the patient. Risks including but not limited to the risk of anesthesia, blood clots, nerve damage, blood vessel damage, failure of the prosthesis, infection and up to and including death. Patient understand the risks, benefits and expectations and wishes to proceed with surgery.   PCP: Rhetta MuraHENRY ALTHISAR, PA-C   Discharged Condition:  good  Hospital Course:  Patient underwent the above stated procedure on 09/02/2015. Patient tolerated the procedure well and brought to the recovery room in good condition and subsequently to the floor.  POD #1 BP: 121/64 ; Pulse: 86 ; Temp: 98.4 F (36.9 C) ; Resp: 14 Patient reports pain as mild, noting incisional pain mainly. Numbness that persistent yesterday resulting her overnight stay.  This has resolved and she feel ready to be discharged home. Dorsiflexion/plantar flexion intact, incision: dressing C/D/I, no cellulitis present and compartment soft.    Discharge Exam: General appearance: alert, cooperative and no distress Extremities: Homans sign is negative, no sign of DVT, no edema, redness or tenderness in the calves or thighs and no ulcers, gangrene or trophic changes  Disposition: Home with follow up in 2 weeks   Follow-up Information    Follow up with Shelda PalLIN,Bettyanne Dittman D, MD. Schedule an appointment as soon as possible for a visit in 2 weeks.   Specialty:  Orthopedic Surgery   Contact information:   [address] [place] [address] [phone]       Follow up with [facility].   Why:  rolling walker and 3n1   Contact information:   [address] [place] [address] [phone]       Discharge Instructions    Call MD / Call 911    Complete by:  As directed   If you experience chest pain or shortness of breath, CALL 911 and be transported to the hospital emergency room.  If you develope a fever above 101 F, pus (white drainage) or increased drainage  or redness at the wound, or calf pain, call your surgeon's office.     Call MD / Call 911    Complete by:  As directed   If you experience chest pain or shortness of breath, CALL 911 and be transported to the hospital emergency room.  If you develope a fever above 101 F, pus (white drainage) or increased drainage or redness at the wound, or calf pain, call your surgeon's  office.     Change dressing    Complete by:  As directed   Maintain surgical dressing until follow up in the clinic. If the edges start to pull up, may reinforce with tape. If the dressing is no longer working, may remove and cover with gauze and tape, but must keep the area dry and clean.  Call with any questions or concerns.     Change dressing    Complete by:  As directed   Maintain surgical dressing until follow up in the clinic. If the edges start to pull up, may reinforce with tape. If the dressing is no longer working, may remove and cover with gauze and tape, but must keep the area dry and clean.  Call with any questions or concerns.     Constipation Prevention    Complete by:  As directed   Drink plenty of fluids.  Prune juice may be helpful.  You may use a stool softener, such as Colace (over the counter) 100 mg twice a day.  Use MiraLax (over the counter) for constipation as needed.     Constipation Prevention    Complete by:  As directed   Drink plenty of fluids.  Prune juice may be helpful.  You may use a stool softener, such as Colace (over the counter) 100 mg twice a day.  Use MiraLax (over the counter) for constipation as needed.     Diet - low sodium heart healthy    Complete by:  As directed      Diet - low sodium heart healthy    Complete by:  As directed      Discharge instructions    Complete by:  As directed   Maintain surgical dressing until follow up in the clinic. If the edges start to pull up, may reinforce with tape. If the dressing is no longer working, may remove and cover with gauze and tape, but must keep the area dry and clean.  Follow up in 2 weeks at [facility]. Call with any questions or concerns.     Discharge instructions    Complete by:  As directed   Maintain surgical dressing until follow up in the clinic. If the edges start to pull up, may reinforce with tape. If the dressing is no longer working, may remove and cover with gauze and tape,  but must keep the area dry and clean.  Follow up in 2 weeks at [facility]. Call with any questions or concerns.     Increase activity slowly as tolerated    Complete by:  As directed   Weight bearing as tolerated with assist device (walker, cane, etc) as directed, use it as long as suggested by your surgeon or therapist, typically at least 4-6 weeks.     Increase activity slowly as tolerated    Complete by:  As directed   Weight bearing as tolerated with assist device (walker, cane, etc) as directed, use it as long as suggested by your surgeon or therapist, typically at least 4-6 weeks.  TED hose    Complete by:  As directed   Use stockings (TED hose) for 2 weeks on both leg(s).  You may remove them at night for sleeping.     TED hose    Complete by:  As directed   Use stockings (TED hose) for 2 weeks on both leg(s).  You may remove them at night for sleeping.             Medication List    STOP taking these medications        cephALEXin 500 MG capsule  Commonly known as:  KEFLEX     diphenhydramine-acetaminophen 25-500 MG Tabs tablet  Commonly known as:  TYLENOL PM     HYDROcodone-acetaminophen 5-325 MG tablet  Commonly known as:  NORCO  Replaced by:  HYDROcodone-acetaminophen 7.5-325 MG tablet      TAKE these medications        albuterol 108 (90 BASE) MCG/ACT inhaler  Commonly known as:  PROVENTIL HFA  Inhale 1 puff into the lungs daily as needed.     aspirin EC 325 MG tablet  Take 1 tablet (325 mg total) by mouth 2 (two) times daily. Take for 4 weeks.     calcium carbonate 500 MG chewable tablet  Commonly known as:  TUMS - dosed in mg elemental calcium  Chew 1 tablet by mouth as needed for indigestion or heartburn.     Fluticasone Furoate-Vilanterol 200-25 MCG/INH Aepb  Commonly known as:  BREO ELLIPTA  Inhale 1 puff into the lungs daily.     HYDROcodone-acetaminophen 7.5-325 MG tablet  Commonly known as:  NORCO  Take 1-2 tablets by mouth every  4 (four) hours as needed for moderate pain.     methocarbamol 500 MG tablet  Commonly known as:  ROBAXIN  Take 1 tablet (500 mg total) by mouth every 6 (six) hours as needed for muscle spasms.     montelukast 10 MG tablet  Commonly known as:  SINGULAIR  TAKE 1 TABLET BY MOUTH EVERY NIGHT AT BEDTIME     multivitamin tablet  Take 1 tablet by mouth every evening.     ondansetron 4 MG tablet  Commonly known as:  ZOFRAN  Take 1 tablet (4 mg total) by mouth every 8 (eight) hours as needed for nausea or vomiting.     OVER THE COUNTER MEDICATION  Take 1 tablet by mouth 4 (four) times a week. Curamed         Signed: Anastasio Auerbach. Treena Cosman   PA-C  09/09/2015, 5:09 PM

## 2015-12-04 ENCOUNTER — Telehealth: Payer: Self-pay | Admitting: Certified Nurse Midwife

## 2015-12-04 NOTE — Telephone Encounter (Signed)
12/04/2015 - Patient returned call and scheduled appt. brm

## 2015-12-05 ENCOUNTER — Ambulatory Visit (INDEPENDENT_AMBULATORY_CARE_PROVIDER_SITE_OTHER): Payer: BLUE CROSS/BLUE SHIELD | Admitting: Certified Nurse Midwife

## 2015-12-05 ENCOUNTER — Encounter: Payer: Self-pay | Admitting: Certified Nurse Midwife

## 2015-12-05 VITALS — BP 110/76 | HR 71 | Wt 180.0 lb

## 2015-12-05 DIAGNOSIS — N910 Primary amenorrhea: Secondary | ICD-10-CM

## 2015-12-05 DIAGNOSIS — Z3202 Encounter for pregnancy test, result negative: Secondary | ICD-10-CM

## 2015-12-05 DIAGNOSIS — N760 Acute vaginitis: Secondary | ICD-10-CM | POA: Diagnosis not present

## 2015-12-05 DIAGNOSIS — N926 Irregular menstruation, unspecified: Secondary | ICD-10-CM

## 2015-12-05 LAB — POCT URINE PREGNANCY: Preg Test, Ur: NEGATIVE

## 2015-12-05 MED ORDER — TERCONAZOLE 0.4 % VA CREA: 1.0000 | TOPICAL_CREAM | Freq: Every day | VAGINAL | Status: DC

## 2015-12-05 MED ORDER — FLUCONAZOLE 100 MG PO TABS: 100.0000 mg | ORAL_TABLET | Freq: Once | ORAL | Status: DC

## 2015-12-05 NOTE — Progress Notes (Signed)
Patient ID: Shelly [name], female   DOB: October 26, 1971, 45 y.o.   MRN: [id number]  Chief Complaint  Patient presents with  . Vaginitis    yeast infection, cycle is 1 weeks late    HPI Shelly [name] is a 45 y.o. female.  Has been having vaginal discharge, white, chunky and pruritic for about a week.  Denies any dysuria or pyuria.  Has not tried anything for the itching.  Has not been sexually active for about a week.  States that her menses cycle is late, she is usually cycling around now.  Has a hx of BTL.  States that she has been under stress lately.  Reassurance given.      HPI  Past Medical History  Diagnosis Date  . GERD (gastroesophageal reflux disease)   . Depression   . Anxiety   . Asthma   . Loose body of right knee   . Osteochondral defect     right knee  . Intermittent palpitations     Past Surgical History  Procedure Laterality Date  . Wisdom tooth extraction Bilateral   . Cesarean section  06-03-2011//  09-27-2003//  2000    Bilateral Tubal Ligation w/ last c/s  . Knee arthroscopy Right 1990  . Left wrist reduction and fusion   07-19-2008    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  . Knee arthroscopy Right 06/11/2015    Procedure: RIGHT ARTHROSCOPY KNEE;  Surgeon: Eugenia Mcalpineobert Collins, MD;  [facility] [place];  Service: Orthopedics;  Laterality: Right;  . Foreign body removal Right 06/11/2015    Procedure: RIGHT REMOVAL LOOSE BODY;  Surgeon: Eugenia Mcalpineobert Collins, MD;  [facility] [place];  Service: Orthopedics;  Laterality: Right;  . Chondroplasty Right 06/11/2015    Procedure: CHONDROPLASTY;  Surgeon: Eugenia Mcalpineobert Collins, MD;  [facility] [place];  Service: Orthopedics;  Laterality: Right;  . Partial knee arthroplasty Right 09/02/2015    Procedure: RIGHT UNI KNEE ARTHROPLASTY MEDIALLY;  Surgeon: Durene RomansMatthew Olin, MD;  Location: [facility];  Service: Orthopedics;  Laterality: Right;    Family History  Problem Relation Age of Onset  . COPD  Father   . Emphysema Father   . Arthritis Mother   . Alcohol abuse Paternal Grandmother   . Cancer Paternal Grandmother     Social History Social History  Substance Use Topics  . Smoking status: Former Smoker -- 6 years    Types: Cigarettes    Quit date: 06/10/1995  . Smokeless tobacco: Never Used  . Alcohol Use: 0.0 oz/week    0 Standard drinks or equivalent per week     Comment: occasional    No Known Allergies  Current Outpatient Prescriptions  Medication Sig Dispense Refill  . Fluticasone Furoate-Vilanterol (BREO ELLIPTA) 200-25 MCG/INH AEPB Inhale 1 puff into the lungs daily. (Patient taking differently: Inhale 1 puff into the lungs every evening. ) 28 each 5  . montelukast (SINGULAIR) 10 MG tablet TAKE 1 TABLET BY MOUTH EVERY NIGHT AT BEDTIME 90 tablet 1  . albuterol (PROVENTIL HFA) 108 (90 BASE) MCG/ACT inhaler Inhale 1 puff into the lungs daily as needed. (Patient taking differently: Inhale 1 puff into the lungs every 4 (four) hours as needed for wheezing or shortness of breath. ) 1 Inhaler 6  . calcium carbonate (TUMS - DOSED IN MG ELEMENTAL CALCIUM) 500 MG chewable tablet Chew 1 tablet by mouth as needed for indigestion or heartburn.    . fluconazole (DIFLUCAN) 100 MG tablet Take 1 tablet (100  mg total) by mouth once. Repeat dose in 48-72 hour. 3 tablet 0  . HYDROcodone-acetaminophen (NORCO) 7.5-325 MG tablet Take 1-2 tablets by mouth every 4 (four) hours as needed for moderate pain. 100 tablet 0  . methocarbamol (ROBAXIN) 500 MG tablet Take 1 tablet (500 mg total) by mouth every 6 (six) hours as needed for muscle spasms. 50 tablet 0  . Multiple Vitamin (MULTIVITAMIN) tablet Take 1 tablet by mouth every evening.     . ondansetron (ZOFRAN) 4 MG tablet Take 1 tablet (4 mg total) by mouth every 8 (eight) hours as needed for nausea or vomiting. 30 tablet 0  . OVER THE COUNTER MEDICATION Take 1 tablet by mouth 4 (four) times a week. Curamed    . terconazole (TERAZOL 7) 0.4 %  vaginal cream Place 1 applicator vaginally at bedtime. 45 g 0   No current facility-administered medications for this visit.    Review of Systems Review of Systems Constitutional: negative for fatigue and weight loss Respiratory: negative for cough and wheezing Cardiovascular: negative for chest pain, fatigue and palpitations Gastrointestinal: negative for abdominal pain and change in bowel habits Genitourinary: + vaginal discharge and itching.  Integument/breast: negative for nipple discharge Musculoskeletal:negative for myalgias Neurological: negative for gait problems and tremors Behavioral/Psych: negative for abusive relationship, depression Endocrine: negative for temperature intolerance     Blood pressure 110/76, pulse 71, weight 180 lb (81.647 kg), last menstrual period 10/31/2015.  Physical Exam Physical Exam General:   alert  Skin:   no rash or abnormalities  Lungs:   clear to auscultation bilaterally  Heart:   regular rate and rhythm, S1, S2 normal, no murmur, click, rub or gallop  Breasts:   normal without suspicious masses, skin or nipple changes or axillary nodes  Abdomen:  normal findings: no organomegaly, soft, non-tender and no hernia  Pelvis:  External genitalia: normal general appearance Urinary system: urethral meatus normal and bladder without fullness, nontender Vaginal: normal without tenderness, induration or masses, +white, thick vaginal discharge Cervix: normal appearance Adnexa: normal bimanual exam Uterus: anteverted and non-tender, normal size    50% of 15 min visit spent on counseling and coordination of care.   Data Reviewed Previous medical hx, meds  Assessment     Vulvovaginal candidasis     Plan    Orders Placed This Encounter  Procedures  . SureSwab Bacterial Vaginosis/itis  . POCT urine pregnancy   Meds ordered this encounter  Medications  . fluconazole (DIFLUCAN) 100 MG tablet    Sig: Take 1 tablet (100 mg total) by mouth  once. Repeat dose in 48-72 hour.    Dispense:  3 tablet    Refill:  0  . terconazole (TERAZOL 7) 0.4 % vaginal cream    Sig: Place 1 applicator vaginally at bedtime.    Dispense:  45 g    Refill:  0    Possible management options include: [place] & lab work if amenorrhea continues Follow up as needed.

## 2015-12-06 ENCOUNTER — Other Ambulatory Visit: Payer: Self-pay | Admitting: Certified Nurse Midwife

## 2015-12-06 ENCOUNTER — Telehealth: Payer: Self-pay | Admitting: *Deleted

## 2015-12-06 DIAGNOSIS — N840 Polyp of corpus uteri: Secondary | ICD-10-CM

## 2015-12-06 NOTE — Telephone Encounter (Signed)
Pt left message for Rachelle regarding polyp on uterus, what to do.  Return call to pt.  Made her aware that Boykin ReaperRachelle is going to order an u/s in order to determine if any significant changes. Pt question in sonohyst.  Pt advised that once order is entered we could inform her. Pt made aware that she may just order a routine u/s and discuss further procedures based on it. Pt made aware that scheduling staff will contact her once u/s has been ordered.  Pt states understanding.

## 2015-12-10 ENCOUNTER — Other Ambulatory Visit: Payer: Self-pay | Admitting: Certified Nurse Midwife

## 2015-12-10 LAB — SURESWAB BACTERIAL VAGINOSIS/ITIS
ATOPOBIUM VAGINAE: NOT DETECTED Log (cells/mL)
C. PARAPSILOSIS, DNA: NOT DETECTED
C. albicans, DNA: DETECTED — AB
C. glabrata, DNA: NOT DETECTED
C. tropicalis, DNA: NOT DETECTED
Gardnerella vaginalis: NOT DETECTED Log (cells/mL)
LACTOBACILLUS SPECIES: 8 Log (cells/mL)
MEGASPHAERA SPECIES: NOT DETECTED Log (cells/mL)
T. vaginalis RNA, QL TMA: NOT DETECTED

## 2015-12-11 ENCOUNTER — Other Ambulatory Visit: Payer: BLUE CROSS/BLUE SHIELD

## 2016-03-19 ENCOUNTER — Encounter (HOSPITAL_COMMUNITY): Payer: Self-pay | Admitting: Emergency Medicine

## 2016-03-19 ENCOUNTER — Emergency Department (HOSPITAL_COMMUNITY): Payer: Self-pay

## 2016-03-19 ENCOUNTER — Inpatient Hospital Stay (HOSPITAL_COMMUNITY)
Admission: EM | Admit: 2016-03-19 | Discharge: 2016-03-21 | DRG: 489 | Disposition: A | Discharge status: Home / Self Care | Payer: Self-pay | Attending: Orthopedic Surgery | Admitting: Orthopedic Surgery

## 2016-03-19 DIAGNOSIS — J45909 Unspecified asthma, uncomplicated: Secondary | ICD-10-CM | POA: Diagnosis present

## 2016-03-19 DIAGNOSIS — S8990XA Unspecified injury of unspecified lower leg, initial encounter: Secondary | ICD-10-CM | POA: Diagnosis present

## 2016-03-19 DIAGNOSIS — Z7951 Long term (current) use of inhaled steroids: Secondary | ICD-10-CM

## 2016-03-19 DIAGNOSIS — T84022A Instability of internal right knee prosthesis, initial encounter: Principal | ICD-10-CM | POA: Diagnosis present

## 2016-03-19 DIAGNOSIS — K219 Gastro-esophageal reflux disease without esophagitis: Secondary | ICD-10-CM | POA: Diagnosis present

## 2016-03-19 DIAGNOSIS — T84018A Broken internal joint prosthesis, other site, initial encounter: Secondary | ICD-10-CM

## 2016-03-19 DIAGNOSIS — Z79899 Other long term (current) drug therapy: Secondary | ICD-10-CM

## 2016-03-19 DIAGNOSIS — Z825 Family history of asthma and other chronic lower respiratory diseases: Secondary | ICD-10-CM

## 2016-03-19 DIAGNOSIS — Z96659 Presence of unspecified artificial knee joint: Secondary | ICD-10-CM

## 2016-03-19 DIAGNOSIS — X501XXA Overexertion from prolonged static or awkward postures, initial encounter: Secondary | ICD-10-CM

## 2016-03-19 DIAGNOSIS — S8991XA Unspecified injury of right lower leg, initial encounter: Secondary | ICD-10-CM

## 2016-03-19 DIAGNOSIS — Y792 Prosthetic and other implants, materials and accessory orthopedic devices associated with adverse incidents: Secondary | ICD-10-CM | POA: Diagnosis present

## 2016-03-19 MED ORDER — HYDROCODONE-ACETAMINOPHEN 5-325 MG PO TABS: 1.0000 | ORAL_TABLET | Freq: Four times a day (QID) | ORAL | Status: DC | PRN

## 2016-03-19 NOTE — ED Notes (Signed)
Patient presents for right knee pain, reports partial knee replacement in October 2016. 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 Patient denies fall, denies new injury to same. No swelling or obvious deformity noted. Rates pain 8/10 with mobility.

## 2016-03-19 NOTE — ED Provider Notes (Signed)
CSN: [id number]     Arrival date & time 03/19/16  1958 History   First MD Initiated Contact with Patient 03/19/16 2243     No chief complaint on file.    (Consider location/radiation/quality/duration/timing/severity/associated sxs/prior Treatment) HPI Comments: Patient presents emergency department with chief complaint of right knee injury. She states that she twisted her knee 2 days ago during a [employer] class. She states that she has been unable to straighten her knee since then. She states that night she was walking, and her knee buckled and gave out on her. She denies any pain at rest, but states that she is still unable to straighten her knee. She has a history of partial knee replacement done by Dr. Charlann Boxer. She reports AF 10 pain with ambulation. She has not taken anything for symptoms. There are no other associated symptoms. There are no additional modifying factors.  The history is provided by the patient. No language interpreter was used.    Past Medical History  Diagnosis Date  . GERD (gastroesophageal reflux disease)   . Depression   . Anxiety   . Asthma   . Loose body of right knee   . Osteochondral defect     right knee  . Intermittent palpitations    Past Surgical History  Procedure Laterality Date  . Wisdom tooth extraction Bilateral   . Cesarean section  06-03-2011//  09-27-2003//  2000    Bilateral Tubal Ligation w/ last c/s  . Knee arthroscopy Right 1990  . Left wrist reduction and fusion   07-19-2008    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  . Knee arthroscopy Right 06/11/2015    Procedure: RIGHT ARTHROSCOPY KNEE;  Surgeon: Eugenia Mcalpine, MD;  Location: [facility];  Service: Orthopedics;  Laterality: Right;  . Foreign body removal Right 06/11/2015    Procedure: RIGHT REMOVAL LOOSE BODY;  Surgeon: Eugenia Mcalpine, MD;  [facility]: [facility];  Service: Orthopedics;  Laterality: Right;  . Chondroplasty Right 06/11/2015    Procedure: CHONDROPLASTY;   Surgeon: Eugenia Mcalpine, MD;  Location: [facility];  Service: Orthopedics;  Laterality: Right;  . Partial knee arthroplasty Right 09/02/2015    Procedure: RIGHT UNI KNEE ARTHROPLASTY MEDIALLY;  Surgeon: Durene Romans, MD;  Location: [facility];  Service: Orthopedics;  Laterality: Right;   Family History  Problem Relation Age of Onset  . COPD Father   . Emphysema Father   . Arthritis Mother   . Alcohol abuse Paternal Grandmother   . Cancer Paternal Grandmother    Social History  Substance Use Topics  . Smoking status: Former Smoker -- 6 years    Types: Cigarettes    Quit date: 06/10/1995  . Smokeless tobacco: Never Used  . Alcohol Use: 0.0 oz/week    0 Standard drinks or equivalent per week     Comment: occasional   OB History    Gravida Para Term Preterm AB TAB SAB Ectopic Multiple Living   0 Review of Systems  Constitutional: Negative for fever and chills.  Respiratory: Negative for shortness of breath.   Cardiovascular: Negative for chest pain.  Gastrointestinal: Negative for nausea, vomiting, diarrhea and constipation.  Genitourinary: Negative for dysuria.  Musculoskeletal: Positive for arthralgias.  All other systems reviewed and are negative.     Allergies  Review of patient's allergies indicates no known allergies.  Home Medications   Prior to Admission medications   Medication Sig  Start Date End Date Taking? Authorizing Provider  acetaminophen (TYLENOL) 500 MG tablet Take 1,000 mg by mouth every 6 (six) hours as needed for mild pain, moderate pain or headache.   Yes Historical Provider, MD  albuterol (PROVENTIL HFA) 108 (90 BASE) MCG/ACT inhaler Inhale 1 puff into the lungs daily as needed. Patient taking differently: Inhale 1 puff into the lungs every 4 (four) hours as needed for wheezing or shortness of breath.  06/06/14  Yes Gordy Savers, MD  budesonide-formoterol [facility]) 160-4.5 MCG/ACT inhaler Inhale 2 puffs into  the lungs 2 (two) times daily.   Yes Historical Provider, MD  calcium carbonate (TUMS - DOSED IN MG ELEMENTAL CALCIUM) 500 MG chewable tablet Chew 1 tablet by mouth as needed for indigestion or heartburn.   Yes Historical Provider, MD  montelukast (SINGULAIR) 10 MG tablet TAKE 1 TABLET BY MOUTH EVERY NIGHT AT BEDTIME Patient taking differently: TAKE 10 MG BY MOUTH EVERY NIGHT AT BEDTIME 10/12/14  Yes Gordy Savers, MD  fluconazole (DIFLUCAN) 100 MG tablet Take 1 tablet (100 mg total) by mouth once. Repeat dose in 48-72 hour. Patient not taking: Reported on 03/19/2016 12/05/15   Rachelle A Denney, CNM  Fluticasone Furoate-Vilanterol (BREO ELLIPTA) 200-25 MCG/INH AEPB Inhale 1 puff into the lungs daily. Patient not taking: Reported on 03/19/2016 06/06/15   Gordy Savers, MD  terconazole (TERAZOL 7) 0.4 % vaginal cream Place 1 applicator vaginally at bedtime. Patient not taking: Reported on 03/19/2016 12/05/15   Rachelle A Denney, CNM   BP 115/85 mmHg  Pulse 74  Temp(Src) 98.6 F (37 C) (Oral)  Resp 18  SpO2 100%  LMP 03/05/2016 (Approximate) Physical Exam  Constitutional: She is oriented to person, place, and time. She appears well-developed and well-nourished.  HENT:  Head: Normocephalic and atraumatic.  Eyes: Conjunctivae and EOM are normal. Pupils are equal, round, and reactive to light.  Neck: Normal range of motion. Neck supple.  Cardiovascular: Normal rate and regular rhythm.  Exam reveals no gallop and no friction rub.   No murmur heard. Pulmonary/Chest: Effort normal and breath sounds normal. No respiratory distress. She has no wheezes. She has no rales. She exhibits no tenderness.  Abdominal: Soft. She exhibits no distension.  Musculoskeletal: Normal range of motion. She exhibits no edema or tenderness.  Right knee nontender to palpation, no bony abnormality or deformity, range of motion limited secondary to pain, unable to fully extend knee, no obvious swelling   Neurological: She is alert and oriented to person, place, and time.  Skin: Skin is warm and dry.  Psychiatric: She has a normal mood and affect. Her behavior is normal. Judgment and thought content normal.  Nursing note and vitals reviewed.   ED Course  Procedures (including critical care time) Labs Review Labs Reviewed - No data to display  Imaging Review Dg Knee Complete 4 Views Right  03/19/2016  CLINICAL DATA:  Patient with history of prior partial knee replacement. EXAM: RIGHT KNEE - COMPLETE 4+ VIEW COMPARISON:  None. FINDINGS: Patient status post partial knee replacement. The spacer has been dislocated anteriorly into Hoffa's fat pad. No definite evidence for acute fracture. No joint effusion. IMPRESSION: There appears to be anterior displacement of the spacer of the partial knee replacement, demonstrated best on lateral view. Electronically Signed   By: Annia Belt M.D.   On: 03/19/2016 21:29   I have personally reviewed and evaluated these images and lab results as part of my medical decision-making.    MDM  Final diagnoses:  Knee injury, right, initial encounter    Patient with displaced right knee spacer. Unable to straighten knee.  Patient discussed with Dr. Particia NearingHaviland, who agrees with plan for orthopedic consult.  Patient discussed with Dr. Charlann Boxerlin, from orthopedics, who will see the patient the morning. Recommends placing temporary admission orders. Patient will be made NPO after midnight.  PRN norcoRoxy Horseman.    Antwone Capozzoli, PA-C 03/19/16 2342

## 2016-03-19 NOTE — ED Notes (Signed)
Pt stated that she did not want an IV until she is to go to surgery in the morning.

## 2016-03-19 NOTE — ED Notes (Signed)
Pt given sandwich and cola before being NPO after Midnight.

## 2016-03-20 ENCOUNTER — Encounter (HOSPITAL_COMMUNITY): Payer: Self-pay | Admitting: Anesthesiology

## 2016-03-20 ENCOUNTER — Observation Stay (HOSPITAL_COMMUNITY): Payer: Self-pay | Admitting: Anesthesiology

## 2016-03-20 ENCOUNTER — Encounter (HOSPITAL_COMMUNITY)
Admission: EM | Disposition: A | Discharge status: Home / Self Care | Payer: Self-pay | Source: Home / Self Care | Attending: Orthopedic Surgery

## 2016-03-20 DIAGNOSIS — T84018A Broken internal joint prosthesis, other site, initial encounter: Secondary | ICD-10-CM

## 2016-03-20 DIAGNOSIS — Z96659 Presence of unspecified artificial knee joint: Secondary | ICD-10-CM

## 2016-03-20 HISTORY — PX: TOTAL KNEE REVISION: SHX996

## 2016-03-20 LAB — CBC
HEMATOCRIT: 36.6 % (ref 36.0–46.0)
HEMOGLOBIN: 12.5 g/dL (ref 12.0–15.0)
MCH: 29.3 pg (ref 26.0–34.0)
MCHC: 34.2 g/dL (ref 30.0–36.0)
MCV: 85.7 fL (ref 78.0–100.0)
PLATELETS: 222 10*3/uL (ref 150–400)
RBC: 4.27 MIL/uL (ref 3.87–5.11)
RDW: 13.2 % (ref 11.5–15.5)
WBC: 9.4 10*3/uL (ref 4.0–10.5)

## 2016-03-20 LAB — BASIC METABOLIC PANEL
Anion gap: 8 (ref 5–15)
BUN: 10 mg/dL (ref 6–20)
CALCIUM: 8.9 mg/dL (ref 8.9–10.3)
CO2: 25 mmol/L (ref 22–32)
CREATININE: 0.85 mg/dL (ref 0.44–1.00)
Chloride: 108 mmol/L (ref 101–111)
Glucose, Bld: 110 mg/dL — ABNORMAL HIGH (ref 65–99)
Potassium: 3.8 mmol/L (ref 3.5–5.1)
SODIUM: 141 mmol/L (ref 135–145)

## 2016-03-20 LAB — MRSA PCR SCREENING: MRSA BY PCR: NEGATIVE

## 2016-03-20 SURGERY — TOTAL KNEE REVISION
Anesthesia: Monitor Anesthesia Care | Site: Knee | Laterality: Right

## 2016-03-20 MED ORDER — ALBUTEROL SULFATE HFA 108 (90 BASE) MCG/ACT IN AERS
1.0000 | INHALATION_SPRAY | Freq: Every day | RESPIRATORY_TRACT | Status: DC | PRN
  Administered 2016-03-20: 1 via RESPIRATORY_TRACT

## 2016-03-20 MED ORDER — ACETAMINOPHEN 650 MG RE SUPP: 650.0000 mg | Freq: Four times a day (QID) | RECTAL | Status: DC | PRN

## 2016-03-20 MED ORDER — ONDANSETRON HCL 4 MG/2ML IJ SOLN: 4.0000 mg | Freq: Four times a day (QID) | INTRAMUSCULAR | Status: DC | PRN

## 2016-03-20 MED ORDER — SODIUM CHLORIDE 0.9 % IJ SOLN
INTRAMUSCULAR | Status: DC | PRN
  Administered 2016-03-20: 30 mL

## 2016-03-20 MED ORDER — ACETAMINOPHEN 10 MG/ML IV SOLN: INTRAVENOUS | Status: DC | PRN

## 2016-03-20 MED ORDER — HYDROMORPHONE HCL 1 MG/ML IJ SOLN: 0.2500 mg | INTRAMUSCULAR | Status: DC | PRN

## 2016-03-20 MED ORDER — SODIUM CHLORIDE 0.9 % IJ SOLN
INTRAMUSCULAR | Status: AC
  Filled 2016-03-20: qty 50

## 2016-03-20 MED ORDER — MIDAZOLAM HCL 5 MG/5ML IJ SOLN
INTRAMUSCULAR | Status: DC | PRN
  Administered 2016-03-20: 1 mg via INTRAVENOUS
  Administered 2016-03-20: 2 mg via INTRAVENOUS
  Administered 2016-03-20: 1 mg via INTRAVENOUS

## 2016-03-20 MED ORDER — POLYETHYLENE GLYCOL 3350 17 G PO PACK: 17.0000 g | PACK | Freq: Every day | ORAL | Status: DC | PRN

## 2016-03-20 MED ORDER — PROPOFOL 10 MG/ML IV BOLUS
INTRAVENOUS | Status: AC
  Filled 2016-03-20: qty 40

## 2016-03-20 MED ORDER — OXYCODONE HCL 5 MG PO TABS: 5.0000 mg | ORAL_TABLET | Freq: Once | ORAL | Status: DC | PRN

## 2016-03-20 MED ORDER — PROPOFOL 500 MG/50ML IV EMUL
INTRAVENOUS | Status: DC | PRN
  Administered 2016-03-20: 120 ug/kg/min via INTRAVENOUS

## 2016-03-20 MED ORDER — HYDROCODONE-ACETAMINOPHEN 7.5-325 MG PO TABS: 1.0000 | ORAL_TABLET | ORAL | Status: DC | PRN

## 2016-03-20 MED ORDER — PHENOL 1.4 % MT LIQD: 1.0000 | OROMUCOSAL | Status: DC | PRN

## 2016-03-20 MED ORDER — KETOROLAC TROMETHAMINE 15 MG/ML IJ SOLN
15.0000 mg | Freq: Four times a day (QID) | INTRAMUSCULAR | Status: DC
  Filled 2016-03-20: qty 1

## 2016-03-20 MED ORDER — FLUTICASONE FUROATE-VILANTEROL 200-25 MCG/INH IN AEPB
1.0000 | INHALATION_SPRAY | Freq: Every day | RESPIRATORY_TRACT | Status: DC
  Administered 2016-03-20 – 2016-03-21 (×2): 1 via RESPIRATORY_TRACT
  Filled 2016-03-20: qty 28

## 2016-03-20 MED ORDER — ALBUTEROL SULFATE HFA 108 (90 BASE) MCG/ACT IN AERS
INHALATION_SPRAY | RESPIRATORY_TRACT | Status: AC
  Filled 2016-03-20: qty 6.7

## 2016-03-20 MED ORDER — MENTHOL 3 MG MT LOZG: 1.0000 | LOZENGE | OROMUCOSAL | Status: DC | PRN

## 2016-03-20 MED ORDER — SODIUM CHLORIDE 0.9 % IJ SOLN
INTRAMUSCULAR | Status: AC
  Filled 2016-03-20: qty 10

## 2016-03-20 MED ORDER — KETOROLAC TROMETHAMINE 30 MG/ML IJ SOLN
INTRAMUSCULAR | Status: AC
  Filled 2016-03-20: qty 1

## 2016-03-20 MED ORDER — CEFAZOLIN SODIUM 1-5 GM-% IV SOLN
1.0000 g | Freq: Four times a day (QID) | INTRAVENOUS | Status: AC
  Administered 2016-03-20 – 2016-03-21 (×2): 1 g via INTRAVENOUS
  Filled 2016-03-20 (×2): qty 50

## 2016-03-20 MED ORDER — ONDANSETRON HCL 4 MG/2ML IJ SOLN
INTRAMUSCULAR | Status: DC | PRN
  Administered 2016-03-20: 4 mg via INTRAVENOUS

## 2016-03-20 MED ORDER — ACETAMINOPHEN 325 MG PO TABS: 650.0000 mg | ORAL_TABLET | Freq: Four times a day (QID) | ORAL | Status: DC | PRN

## 2016-03-20 MED ORDER — METOCLOPRAMIDE HCL 5 MG PO TABS
5.0000 mg | ORAL_TABLET | Freq: Three times a day (TID) | ORAL | Status: DC | PRN
  Filled 2016-03-20: qty 2

## 2016-03-20 MED ORDER — ONDANSETRON HCL 4 MG/2ML IJ SOLN
INTRAMUSCULAR | Status: AC
  Filled 2016-03-20: qty 2

## 2016-03-20 MED ORDER — KETOROLAC TROMETHAMINE 30 MG/ML IJ SOLN
INTRAMUSCULAR | Status: DC | PRN
  Administered 2016-03-20: 30 mg

## 2016-03-20 MED ORDER — PROPOFOL 10 MG/ML IV BOLUS
INTRAVENOUS | Status: AC
  Filled 2016-03-20: qty 20

## 2016-03-20 MED ORDER — ACETAMINOPHEN 10 MG/ML IV SOLN
1000.0000 mg | Freq: Once | INTRAVENOUS | Status: AC
  Administered 2016-03-20: 1000 mg via INTRAVENOUS
  Filled 2016-03-20: qty 100

## 2016-03-20 MED ORDER — BUPIVACAINE IN DEXTROSE 0.75-8.25 % IT SOLN
INTRATHECAL | Status: DC | PRN
  Administered 2016-03-20: 1.6 mL via INTRATHECAL

## 2016-03-20 MED ORDER — LACTATED RINGERS IV SOLN
INTRAVENOUS | Status: DC
  Administered 2016-03-20: 14:00:00 via INTRAVENOUS
  Administered 2016-03-20: 1000 mL via INTRAVENOUS

## 2016-03-20 MED ORDER — ALBUTEROL SULFATE (2.5 MG/3ML) 0.083% IN NEBU: 2.5000 mg | INHALATION_SOLUTION | Freq: Every day | RESPIRATORY_TRACT | Status: DC | PRN

## 2016-03-20 MED ORDER — ALUM & MAG HYDROXIDE-SIMETH 200-200-20 MG/5ML PO SUSP: 30.0000 mL | ORAL | Status: DC | PRN

## 2016-03-20 MED ORDER — ACETAMINOPHEN 500 MG PO TABS
1000.0000 mg | ORAL_TABLET | Freq: Once | ORAL | Status: AC
  Administered 2016-03-20: 1000 mg via ORAL
  Filled 2016-03-20: qty 2

## 2016-03-20 MED ORDER — METOCLOPRAMIDE HCL 5 MG/ML IJ SOLN: 5.0000 mg | Freq: Three times a day (TID) | INTRAMUSCULAR | Status: DC | PRN

## 2016-03-20 MED ORDER — FENTANYL CITRATE (PF) 100 MCG/2ML IJ SOLN
INTRAMUSCULAR | Status: DC | PRN
  Administered 2016-03-20: 100 ug via INTRAVENOUS

## 2016-03-20 MED ORDER — PROPOFOL 10 MG/ML IV BOLUS
INTRAVENOUS | Status: DC | PRN
  Administered 2016-03-20: 30 mg via INTRAVENOUS

## 2016-03-20 MED ORDER — CEFAZOLIN SODIUM-DEXTROSE 2-4 GM/100ML-% IV SOLN
INTRAVENOUS | Status: AC
  Filled 2016-03-20: qty 100

## 2016-03-20 MED ORDER — DEXAMETHASONE SODIUM PHOSPHATE 10 MG/ML IJ SOLN
INTRAMUSCULAR | Status: DC | PRN
  Administered 2016-03-20: 10 mg via INTRAVENOUS

## 2016-03-20 MED ORDER — DEXAMETHASONE SODIUM PHOSPHATE 10 MG/ML IJ SOLN
10.0000 mg | Freq: Once | INTRAMUSCULAR | Status: AC
  Administered 2016-03-21: 10 mg via INTRAVENOUS
  Filled 2016-03-20: qty 1

## 2016-03-20 MED ORDER — BUPIVACAINE-EPINEPHRINE (PF) 0.25% -1:200000 IJ SOLN
INTRAMUSCULAR | Status: AC
  Filled 2016-03-20: qty 30

## 2016-03-20 MED ORDER — DEXAMETHASONE SODIUM PHOSPHATE 10 MG/ML IJ SOLN
INTRAMUSCULAR | Status: AC
  Filled 2016-03-20: qty 1

## 2016-03-20 MED ORDER — FENTANYL CITRATE (PF) 100 MCG/2ML IJ SOLN
INTRAMUSCULAR | Status: AC
  Filled 2016-03-20: qty 2

## 2016-03-20 MED ORDER — CALCIUM CARBONATE ANTACID 500 MG PO CHEW
1.0000 | CHEWABLE_TABLET | ORAL | Status: DC | PRN
  Filled 2016-03-20: qty 1

## 2016-03-20 MED ORDER — DOCUSATE SODIUM 100 MG PO CAPS
100.0000 mg | ORAL_CAPSULE | Freq: Two times a day (BID) | ORAL | Status: DC
  Filled 2016-03-20 (×3): qty 1

## 2016-03-20 MED ORDER — MIDAZOLAM HCL 2 MG/2ML IJ SOLN
INTRAMUSCULAR | Status: AC
Start: 2016-03-20 — End: 2016-03-20
  Filled 2016-03-20: qty 2

## 2016-03-20 MED ORDER — EPHEDRINE SULFATE 50 MG/ML IJ SOLN
INTRAMUSCULAR | Status: AC
  Filled 2016-03-20: qty 1

## 2016-03-20 MED ORDER — ONDANSETRON HCL 4 MG PO TABS: 4.0000 mg | ORAL_TABLET | Freq: Four times a day (QID) | ORAL | Status: DC | PRN

## 2016-03-20 MED ORDER — HYDROMORPHONE HCL 1 MG/ML IJ SOLN: 0.5000 mg | INTRAMUSCULAR | Status: DC | PRN

## 2016-03-20 MED ORDER — MONTELUKAST SODIUM 10 MG PO TABS
10.0000 mg | ORAL_TABLET | Freq: Every day | ORAL | Status: DC
  Administered 2016-03-20: 10 mg via ORAL
  Filled 2016-03-20 (×2): qty 1

## 2016-03-20 MED ORDER — BUPIVACAINE-EPINEPHRINE 0.25% -1:200000 IJ SOLN
INTRAMUSCULAR | Status: DC | PRN
  Administered 2016-03-20: 30 mL

## 2016-03-20 MED ORDER — CEFAZOLIN SODIUM-DEXTROSE 2-4 GM/100ML-% IV SOLN
2.0000 g | Freq: Once | INTRAVENOUS | Status: AC
  Administered 2016-03-20: 2 g via INTRAVENOUS
  Filled 2016-03-20: qty 100

## 2016-03-20 MED ORDER — SODIUM CHLORIDE 0.9 % IV SOLN
INTRAVENOUS | Status: DC
  Administered 2016-03-20: 17:00:00 via INTRAVENOUS

## 2016-03-20 MED ORDER — MIDAZOLAM HCL 2 MG/2ML IJ SOLN
INTRAMUSCULAR | Status: AC
  Filled 2016-03-20: qty 2

## 2016-03-20 MED ORDER — ASPIRIN EC 325 MG PO TBEC
325.0000 mg | DELAYED_RELEASE_TABLET | Freq: Every day | ORAL | Status: DC
  Administered 2016-03-21: 325 mg via ORAL
  Filled 2016-03-20 (×2): qty 1

## 2016-03-20 MED ORDER — ACETAMINOPHEN 160 MG/5ML PO SOLN: 960.0000 mg | Freq: Once | ORAL | Status: AC

## 2016-03-20 MED ORDER — ACETAMINOPHEN 10 MG/ML IV SOLN
INTRAVENOUS | Status: AC
  Filled 2016-03-20: qty 100

## 2016-03-20 MED ORDER — OXYCODONE HCL 5 MG/5ML PO SOLN: 5.0000 mg | Freq: Once | ORAL | Status: DC | PRN

## 2016-03-20 SURGICAL SUPPLY — 61 items
BAG DECANTER FOR FLEXI CONT (MISCELLANEOUS) IMPLANT
BAG SPEC THK2 15X12 ZIP CLS (MISCELLANEOUS)
BAG ZIPLOCK 12X15 (MISCELLANEOUS) IMPLANT
BANDAGE ACE 6X5 VEL STRL LF (GAUZE/BANDAGES/DRESSINGS) ×2 IMPLANT
BEARING TIB MENISCAL RT XS 5MM IMPLANT
BLADE SAW SGTL 13.0X1.19X90.0M (BLADE) ×2 IMPLANT
BLADE SAW SGTL 81X20 HD (BLADE) ×2 IMPLANT
BRNG TIB XS 5 BYNT PNT RT MEN ×1 IMPLANT
BRUSH FEMORAL CANAL (MISCELLANEOUS) IMPLANT
CLOTH BEACON ORANGE TIMEOUT ST (SAFETY) ×2 IMPLANT
CUFF TOURN SGL QUICK 34 (TOURNIQUET CUFF) ×2
CUFF TRNQT CYL 34X4X40X1 (TOURNIQUET CUFF) ×1 IMPLANT
DRAPE U-SHAPE 47X51 STRL (DRAPES) ×2 IMPLANT
DRESSING AQUACEL AG SP 3.5X10 (GAUZE/BANDAGES/DRESSINGS) IMPLANT
DRSG ADAPTIC 3X8 NADH LF (GAUZE/BANDAGES/DRESSINGS) IMPLANT
DRSG AQUACEL AG ADV 3.5X10 (GAUZE/BANDAGES/DRESSINGS) ×2 IMPLANT
DRSG AQUACEL AG SP 3.5X10 (GAUZE/BANDAGES/DRESSINGS)
DRSG PAD ABDOMINAL 8X10 ST (GAUZE/BANDAGES/DRESSINGS) IMPLANT
DRSG TEGADERM 4X4.75 (GAUZE/BANDAGES/DRESSINGS) IMPLANT
DURAPREP 26ML APPLICATOR (WOUND CARE) ×4 IMPLANT
ELECT REM PT RETURN 9FT ADLT (ELECTROSURGICAL) ×2
ELECTRODE REM PT RTRN 9FT ADLT (ELECTROSURGICAL) ×1 IMPLANT
GAUZE SPONGE 2X2 8PLY STRL LF (GAUZE/BANDAGES/DRESSINGS) IMPLANT
GAUZE SPONGE 4X4 12PLY STRL (GAUZE/BANDAGES/DRESSINGS) IMPLANT
GLOVE BIOGEL M 7.0 STRL (GLOVE) IMPLANT
GLOVE BIOGEL PI IND STRL 7.5 (GLOVE) ×1 IMPLANT
GLOVE BIOGEL PI IND STRL 8.5 (GLOVE) ×1 IMPLANT
GLOVE BIOGEL PI INDICATOR 7.5 (GLOVE) ×1
GLOVE BIOGEL PI INDICATOR 8.5 (GLOVE) ×1
GLOVE ECLIPSE 8.0 STRL XLNG CF (GLOVE) ×2 IMPLANT
GLOVE ORTHO TXT STRL SZ7.5 (GLOVE) ×4 IMPLANT
GOWN STRL REUS W/TWL LRG LVL3 (GOWN DISPOSABLE) ×2 IMPLANT
GOWN STRL REUS W/TWL XL LVL3 (GOWN DISPOSABLE) ×2 IMPLANT
HANDPIECE INTERPULSE COAX TIP (DISPOSABLE) ×2
IMMOBILIZER KNEE 20 (SOFTGOODS)
IMMOBILIZER KNEE 20 THIGH 36 (SOFTGOODS) IMPLANT
LIQUID BAND (GAUZE/BANDAGES/DRESSINGS) ×2 IMPLANT
MANIFOLD NEPTUNE II (INSTRUMENTS) ×2 IMPLANT
NS IRRIG 1000ML POUR BTL (IV SOLUTION) ×2 IMPLANT
PACK TOTAL KNEE CUSTOM (KITS) ×2 IMPLANT
PADDING CAST COTTON 6X4 STRL (CAST SUPPLIES) IMPLANT
POSITIONER SURGICAL ARM (MISCELLANEOUS) ×2 IMPLANT
SET HNDPC FAN SPRY TIP SCT (DISPOSABLE) ×1 IMPLANT
SET PAD KNEE POSITIONER (MISCELLANEOUS) ×2 IMPLANT
SPONGE GAUZE 2X2 STER 10/PKG (GAUZE/BANDAGES/DRESSINGS)
SPONGE LAP 18X18 X RAY DECT (DISPOSABLE) IMPLANT
STAPLER VISISTAT 35W (STAPLE) IMPLANT
SUT MNCRL AB 3-0 PS2 18 (SUTURE) ×2 IMPLANT
SUT VIC AB 1 CT1 36 (SUTURE) ×2 IMPLANT
SUT VIC AB 2-0 CT1 27 (SUTURE) ×6
SUT VIC AB 2-0 CT1 TAPERPNT 27 (SUTURE) ×3 IMPLANT
SUT VLOC 180 0 24IN GS25 (SUTURE) ×2 IMPLANT
SYR 50ML LL SCALE MARK (SYRINGE) ×2 IMPLANT
TOWER CARTRIDGE SMART MIX (DISPOSABLE) ×1 IMPLANT
TRAY FOLEY W/METER SILVER 14FR (SET/KITS/TRAYS/PACK) ×2 IMPLANT
TRAY FOLEY W/METER SILVER 16FR (SET/KITS/TRAYS/PACK) ×1 IMPLANT
TUBE KAMVAC SUCTION (TUBING) IMPLANT
UNICOMP OXFORD MENIXSCAL EX SM ×2 IMPLANT
WATER STERILE IRR 1500ML POUR (IV SOLUTION) ×2 IMPLANT
WRAP KNEE MAXI GEL POST OP (GAUZE/BANDAGES/DRESSINGS) ×1 IMPLANT
YANKAUER SUCT BULB TIP 10FT TU (MISCELLANEOUS) IMPLANT

## 2016-03-20 NOTE — H&P (Signed)
TOTAL KNEE REVISION ADMISSION H&P  Patient is being admitted for right revision total knee arthroplasty, poly exchange.  Subjective:  Chief Complaint:   Right knee pain  HPI: Shelly [name], 45 y.o. female, has a history of pain and functional disability in the right knee(s) due to failed previous arthroplasty.  Patient presents emergency department with chief complaint of right knee pain and decreased ROM.  She states that a couple of days ago she was in a [employer] class where she was able to finally accomplish a deep stretch in the right knee.  She felt a little pain, but in general felt well.  Yesterday she twisted her knee, felt a pop and as if something was floating around in the anterior aspect of her knee.  She presented to the ER and Dr. Charlann Boxerlin was consulted.  Dr. Charlann Boxerlin actually did her right UKR on 09/01/2016 and sh has been doing well until now.  Discussion was had with the patient regarding the procedure and though she really doesn't want another surgery she is ready to proceed.  Risks, benefits and expectations were discussed with the patient.  Risks including but not limited to the risk of anesthesia, blood clots, nerve damage, blood vessel damage, failure of the prosthesis, infection and up to and including death.  Patient understand the risks, benefits and expectations and wishes to proceed with surgery.   PCP: Shelly MuraHENRY ALTHISAR, PA-C  D/C Plans:      Home  Post-op Meds:       No Rx given  Decadron:      Is to be given  FYI:     ASA  Norco      Patient Active Problem List   Diagnosis Date Noted  . Knee injury 03/19/2016  . 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 S/P right knee arthroscopy 06/11/2015  . Endometrial polyp 10/11/2013  . ALLERGIC RHINITIS 11/14/2009  . WRIST PAIN, LEFT 10/13/2007  . ASTHMA 08/20/2007  . GERD 08/20/2007   Past Medical History  Diagnosis Date  . GERD (gastroesophageal reflux disease)   . Depression    . Anxiety   . Asthma   . Loose body of right knee   . Osteochondral defect     right knee  . Intermittent palpitations     Past Surgical History  Procedure Laterality Date  . Wisdom tooth extraction Bilateral   . Cesarean section  06-03-2011//  09-27-2003//  2000    Bilateral Tubal Ligation w/ last c/s  . Knee arthroscopy Right 1990  . Left wrist reduction and fusion   07-19-2008    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  . Knee arthroscopy Right 06/11/2015    Procedure: RIGHT ARTHROSCOPY KNEE;  Surgeon: Eugenia Mcalpineobert Collins, MD;  [facility] [place];  Service: Orthopedics;  Laterality: Right;  . Foreign body removal Right 06/11/2015    Procedure: RIGHT REMOVAL LOOSE BODY;  Surgeon: Eugenia Mcalpineobert Collins, MD;  [facility] [place];  Service: Orthopedics;  Laterality: Right;  . Chondroplasty Right 06/11/2015    Procedure: CHONDROPLASTY;  Surgeon: Eugenia Mcalpineobert Collins, MD;  [facility] [place];  Service: Orthopedics;  Laterality: Right;  . Partial knee arthroplasty Right 09/02/2015    Procedure: RIGHT UNI KNEE ARTHROPLASTY MEDIALLY;  Surgeon: Durene RomansMatthew Olin, MD;  Location: [facility];  Service: Orthopedics;  Laterality: Right;    Prescriptions prior to admission  Medication Sig Dispense Refill Last Dose  . acetaminophen (TYLENOL) 500 MG tablet Take  1,000 mg by mouth every 6 (six) hours as needed for mild pain, moderate pain or headache.   03/18/2016 at Unknown time  . albuterol (PROVENTIL HFA) 108 (90 BASE) MCG/ACT inhaler Inhale 1 puff into the lungs daily as needed. (Patient taking differently: Inhale 1 puff into the lungs every 4 (four) hours as needed for wheezing or shortness of breath. ) 1 Inhaler 6 03/18/2016 at Unknown time  . budesonide-formoterol (SYMBICORT) 160-4.5 MCG/ACT inhaler Inhale 2 puffs into the lungs 2 (two) times daily.   03/19/2016 at Unknown time  . calcium carbonate (TUMS - DOSED IN MG ELEMENTAL CALCIUM) 500 MG chewable tablet Chew 1 tablet by mouth as  needed for indigestion or heartburn.   03/18/2016 at Unknown time  . montelukast (SINGULAIR) 10 MG tablet TAKE 1 TABLET BY MOUTH EVERY NIGHT AT BEDTIME (Patient taking differently: TAKE 10 MG BY MOUTH EVERY NIGHT AT BEDTIME) 90 tablet 1 03/18/2016 at Unknown time  . fluconazole (DIFLUCAN) 100 MG tablet Take 1 tablet (100 mg total) by mouth once. Repeat dose in 48-72 hour. (Patient not taking: Reported on 03/19/2016) 3 tablet 0 Completed Course at Unknown time  . Fluticasone Furoate-Vilanterol (BREO ELLIPTA) 200-25 MCG/INH AEPB Inhale 1 puff into the lungs daily. (Patient not taking: Reported on 03/19/2016) 28 each 5 Not Taking at Unknown time  . terconazole (TERAZOL 7) 0.4 % vaginal cream Place 1 applicator vaginally at bedtime. (Patient not taking: Reported on 03/19/2016) 45 g 0 Completed Course at Unknown time   No Known Allergies   Social History  Substance Use Topics  . Smoking status: Former Smoker -- 6 years    Types: Cigarettes    Quit date: 06/10/1995  . Smokeless tobacco: Never Used  . Alcohol Use: 0.0 oz/week    0 Standard drinks or equivalent per week     Comment: occasional    Family History  Problem Relation Age of Onset  . COPD Father   . Emphysema Father   . Arthritis Mother   . Alcohol abuse Paternal Grandmother   . Cancer Paternal Grandmother      Review of Systems  Constitutional: Negative.   HENT: Negative.   Eyes: Negative.   Respiratory: Negative.   Cardiovascular: Negative.   Gastrointestinal: Positive for heartburn.  Genitourinary: Negative.   Musculoskeletal: Positive for joint pain.  Skin: Negative.   Neurological: Negative.   Endo/Heme/Allergies: Positive for environmental allergies.  Psychiatric/Behavioral: Positive for depression. The patient is nervous/anxious.      Objective:  Physical Exam  Constitutional: She is oriented to person, place, and time. She appears well-developed.  HENT:  Head: Normocephalic.  Eyes: Pupils are equal, round, and  reactive to light.  Neck: Neck supple. No JVD present. No tracheal deviation present. No thyromegaly present.  Cardiovascular: Normal rate, regular rhythm, normal heart sounds and intact distal pulses.   Respiratory: Effort normal and breath sounds normal. No stridor. No respiratory distress. She has no wheezes.  GI: Soft. There is no tenderness. There is no guarding.  Musculoskeletal:       Right knee: She exhibits decreased range of motion, swelling, deformity, laceration (healed previous incision) and abnormal alignment. She exhibits no ecchymosis and no erythema. Tenderness found.  Lymphadenopathy:    She has no cervical adenopathy.  Neurological: She is alert and oriented to person, place, and time.  Skin: Skin is warm and dry.  Psychiatric: She has a normal mood and affect.    Vital signs in last 24 hours: Temp:  Iker.Keens F (  36.7 C)-98.6 F (37 C)] 98.2 F (36.8 C) (04/28 0610) Pulse Rate:  [59-74] 59 (04/28 0610) Resp:  [18] 18 (04/28 0610) BP: (112-134)/(58-88) 112/58 mmHg (04/28 0610) SpO2:  [99 %-100 %] 100 % (04/28 0610) Weight:  [81.6 kg (179 lb 14.3 oz)] 81.6 kg (179 lb 14.3 oz) (04/28 0000)  Labs:  Estimated body mass index is 29.94 kg/(m^2) as calculated from the following:   Height as of this encounter:  (1.651 m).   Weight as of this encounter: 81.6 kg (179 lb 14.3 oz).  Imaging Review Plain radiographs demonstrate the anterior displacement of the poly of a UKR of the right knee(s). The overall alignment is good of the individual components, but poly is anteriorly displaced. The bone quality appears to be good for age and reported activity level.  Assessment/Plan:  Right knee(s) with failed previous unicompartmental knee arthroplasty.   The patient history, physical examination, clinical judgment of the provider and imaging studies are consistent with anterior displacement of the poly in the right knee(s), previous unicompartmental knee arthroplasty. Revision  total knee arthroplasty, poly exchange, is deemed medically necessary. The treatment options including revision arthroplasty were discussed at length. The risks and benefits of revision total knee arthroplasty were presented and reviewed. The risks due to aseptic loosening, infection, stiffness, patella tracking problems, thromboembolic complications and other imponderables were discussed. The patient acknowledged the explanation, agreed to proceed with the plan and consent was signed. Patient is being admitted for inpatient treatment for surgery, pain control, PT, OT, prophylactic antibiotics, VTE prophylaxis, progressive ambulation and ADL's and discharge planning.The patient is planning to be discharged home.     Anastasio Auerbach Merit Maybee   PA-C  03/20/2016, 7:56 AM

## 2016-03-20 NOTE — Brief Op Note (Signed)
03/19/2016 - 03/20/2016  1:45 PM  PATIENT:  Leland HerLorien M Woehl  45 y.o. 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  PROCEDURE:  Procedure(s): Right partial KNEE replacement REVISION, polyethylene  SURGEON:  Surgeon(s) and Role:    * Durene RomansMatthew Presleigh Feldstein, MD - Primary  PHYSICIAN ASSISTANT: None  ASSISTANTS: Surgical team  ANESTHESIA:   spinal  EBL:  <50cc  BLOOD ADMINISTERED:none  DRAINS: none   LOCAL MEDICATIONS USED:  MARCAINE     SPECIMEN:  No Specimen  DISPOSITION OF SPECIMEN:  N/A  COUNTS:  YES  TOURNIQUET:  min @ 250 mmHG  DICTATION: .Other Dictation: Dictation Number [id number]  PLAN OF CARE: Admit for overnight observation  PATIENT DISPOSITION:  PACU - hemodynamically stable.   Delay start of Pharmacological VTE agent (>24hrs) due to surgical blood loss or risk of bleeding: no

## 2016-03-20 NOTE — Interval H&P Note (Signed)
History and Physical Interval Note:  03/20/2016 1:36 PM  Shelly [name]  has presented today for surgery, with the diagnosis of plastic dislocation, revision of plastic piece, will use Biomet Patient has had a RIGHT KNEE PLACEMENT  The various methods of treatment have been discussed with the patient and family. After consideration of risks, benefits and other options for treatment, the patient has consented to  Procedure(s): TOTAL KNEE REVISION (Right) as a surgical intervention .  The patient's history has been reviewed, patient examined, no change in status, stable for surgery.  I have reviewed the patient's chart and labs.  Questions were answered to the patient's satisfaction.     Shelda PalLIN,Winnell Bento D

## 2016-03-20 NOTE — Anesthesia Preprocedure Evaluation (Signed)
Anesthesia Evaluation  Patient identified by MRN, date of birth, ID band Patient awake    Reviewed: Allergy & Precautions, NPO status , Patient's Chart, lab work & pertinent test results  Airway Mallampati: II   Neck ROM: full    Dental   Pulmonary asthma , former smoker,    breath sounds clear to auscultation       Cardiovascular negative cardio ROS   Rhythm:regular Rate:Normal     Neuro/Psych Anxiety Depression    GI/Hepatic GERD  ,  Endo/Other    Renal/GU      Musculoskeletal   Abdominal   Peds  Hematology   Anesthesia Other Findings   Reproductive/Obstetrics                             Anesthesia Physical Anesthesia Plan  ASA: II  Anesthesia Plan: MAC and Spinal   Post-op Pain Management:    Induction: Intravenous  Airway Management Planned: Simple Face Mask  Additional Equipment:   Intra-op Plan:   Post-operative Plan:   Informed Consent: I have reviewed the patients History and Physical, chart, labs and discussed the procedure including the risks, benefits and alternatives for the proposed anesthesia with the patient or authorized representative who has indicated his/her understanding and acceptance.     Plan Discussed with: CRNA, Anesthesiologist and Surgeon  Anesthesia Plan Comments:         Anesthesia Quick Evaluation

## 2016-03-20 NOTE — Transfer of Care (Signed)
Immediate Anesthesia Transfer of Care Note  Patient: Shelly [name]  Procedure(s) Performed: Procedure(s): RIGHT UNI KNEE POLY EXCHANGE (Right)  Patient Location: PACU  Anesthesia Type:Spinal  Level of Consciousness: awake  Airway & Oxygen Therapy: Patient Spontanous Breathing and Patient connected to face mask oxygen  Post-op Assessment: Report given to RN and Post -op Vital signs reviewed and stable  Post vital signs: Reviewed and stable  Last Vitals:  Filed Vitals:   03/20/16 0016 03/20/16 0610  BP: 131/70 112/58  Pulse: 67 59  Temp: 36.7 C 36.8 C  Resp: 18 18    Last Pain:  Filed Vitals:   03/20/16 0951  PainSc: 0-No pain         Complications: No apparent anesthesia complications

## 2016-03-20 NOTE — Anesthesia Procedure Notes (Signed)
Spinal Patient location during procedure: OR Start time: 03/20/2016 1:45 PM End time: 03/20/2016 1:50 PM Staffing Resident/CRNA: Chantea Surace L Performed by: resident/CRNA  Preanesthetic Checklist Completed: patient identified, site marked, surgical consent, pre-op evaluation, timeout performed, IV checked, risks and benefits discussed and monitors and equipment checked Spinal Block Patient position: sitting Prep: Betadine Patient monitoring: heart rate, continuous pulse ox and blood pressure Approach: midline Location: L3-4 Injection technique: single-shot Needle Needle type: Sprotte  Needle gauge: 24 G Needle length: 9 cm Additional Notes Kit expiration date 03/22/2017 and lot #[id number] Clear free flowing CSF, negative heme, negative paresthesia Tolerated well and returned to supine position

## 2016-03-21 MED ORDER — HYDROCODONE-ACETAMINOPHEN 7.5-325 MG PO TABS: 1.0000 | ORAL_TABLET | ORAL | Status: DC | PRN

## 2016-03-21 MED ORDER — ASPIRIN 325 MG PO TBEC: 325.0000 mg | DELAYED_RELEASE_TABLET | Freq: Every day | ORAL | Status: DC

## 2016-03-21 NOTE — Progress Notes (Signed)
Subjective: 1 Day Post-Op Procedure(s) (LRB): RIGHT UNI KNEE POLY EXCHANGE (Right) Patient reports pain as mild.  Tolerating PO's well. Denies complications. NO SOB,CP, of calf pain. PT reports she is ready for d/c.  Objective: Vital signs in last 24 hours: Temp:  [97.6 F (36.4 C)-99.1 F (37.3 C)] 98 F (36.7 C) (04/29 0634) Pulse Rate:  [50-87] 51 (04/29 0634) Resp:  [12-18] 18 (04/29 0634) BP: (102-124)/(49-69) 112/69 mmHg (04/29 0634) SpO2:  [93 %-100 %] 97 % (04/29 0634)  Intake/Output from previous day: 04/28 0701 - 04/29 0700 In: 1980 [P.O.:480; I.V.:1500] Out: 1275 [Urine:1225; Blood:50] Intake/Output this shift:     Recent Labs  03/20/16 1700  HGB 12.5    Recent Labs  03/20/16 1700  WBC 9.4  RBC 4.27  HCT 36.6  PLT 222    Recent Labs  03/20/16 1700  NA 141  K 3.8  CL 108  CO2 25  BUN 10  CREATININE 0.85  GLUCOSE 110*  CALCIUM 8.9   No results for input(s): LABPT, INR in the last 72 hours.  Well nourished. Alert and oriented x3. RRR, Lungs clear, BS x4. Abdomen soft and non tender. Right Calf soft and non tender. Right knee dressing C/D/I. No DVT signs. Compartment soft. No signs of infection.  Right LE grossly neurovascular intact.  Assessment/Plan: 1 Day Post-Op Procedure(s) (LRB): RIGHT UNI KNEE POLY EXCHANGE (Right) D/c home  f/u in office Take medication as directed Follow instructions  Shelly [name] L 03/21/2016, 7:28 AM

## 2016-03-21 NOTE — Evaluation (Signed)
Physical Therapy Evaluation Patient Details Name: Shelly [name] MRN: [id number] DOB: 1971/03/27 Today's Date: 03/21/2016   History of Present Illness  revision R UKR  Clinical Impression  The patient is mobilizing well. Reports popping and squishing in R knee and numbness along medial and lateral lower leg. To notify MD if things do not change. Ready for DC.    Follow Up Recommendations No PT follow up    Equipment Recommendations  None recommended by PT    Recommendations for Other Services       Precautions / Restrictions Precautions Precautions: Fall;Knee      Mobility  Bed Mobility Overal bed mobility: Independent                Transfers Overall transfer level: Modified independent                  Ambulation/Gait Ambulation/Gait assistance: Modified independent (Device/Increase time) Ambulation Distance (Feet): 200 Feet   Gait Pattern/deviations: Step-to pattern;Step-through pattern;Antalgic        Stairs            Wheelchair Mobility    Modified Rankin (Stroke Patients Only)       Balance                                             Pertinent Vitals/Pain Pain Assessment: 0-10 Pain Score: 3  Pain Descriptors / Indicators: Sore Pain Intervention(s): Premedicated before session;Repositioned;Ice applied    Home Living Family/patient expects to be discharged to:: Private residence Living Arrangements: Spouse/significant other;Children Available Help at Discharge: Family Type of Home: House Home Access: Level entry     Home Layout: Multi-level Home Equipment: Crutches;Cane - single point      Prior Function Level of Independence: Independent               Hand Dominance        Extremity/Trunk Assessment   Upper Extremity Assessment: Overall WFL for tasks assessed           Lower Extremity Assessment: RLE deficits/detail RLE Deficits / Details: + SLR, knee flexion 100, patient  reports feeling squishing and popping, noted effusion/swelling present. reports medial and lateral lower leg feels numb.       Communication   Communication: No difficulties  Cognition Arousal/Alertness: Awake/alert Behavior During Therapy: WFL for tasks assessed/performed Overall Cognitive Status: Within Functional Limits for tasks assessed                      General Comments      Exercises Total Joint Exercises Quad Sets: AROM;Right;10 reps;Supine Heel Slides: AROM;Right;10 reps      Assessment/Plan    PT Assessment Patent does not need any further PT services  PT Diagnosis Difficulty walking;Acute pain   PT Problem List    PT Treatment Interventions     PT Goals (Current goals can be found in the Care Plan section) Acute Rehab PT Goals Patient Stated Goal: go home PT Goal Formulation: All assessment and education complete, DC therapy    Frequency     Barriers to discharge        Co-evaluation               End of Session   Activity Tolerance: Patient tolerated treatment well Patient left: in chair;with call bell/phone within reach Nurse Communication: Mobility status  Functional Assessment Tool Used: clinical judgement Functional Limitation: Mobility: Walking and moving around Mobility: Walking and Moving Around Current Status [phone]): At least 1 percent but less than 20 percent impaired, limited or restricted Mobility: Walking and Moving Around Goal Status [phone]): At least 1 percent but less than 20 percent impaired, limited or restricted Mobility: Walking and Moving Around Discharge Status [phone]): At least 1 percent but less than 20 percent impaired, limited or restricted    Time: 0858-0920 PT Time Calculation (min) (ACUTE ONLY): 22 min   Charges:   PT Evaluation $PT Eval Low Complexity: 1 Procedure     PT G Codes:   PT G-Codes **NOT FOR INPATIENT CLASS** Functional Assessment Tool Used: clinical judgement Functional Limitation:  Mobility: Walking and moving around Mobility: Walking and Moving Around Current Status (B1478): At least 1 percent but less than 20 percent impaired, limited or restricted Mobility: Walking and Moving Around Goal Status [phone]): At least 1 percent but less than 20 percent impaired, limited or restricted Mobility: Walking and Moving Around Discharge Status [phone]): At least 1 percent but less than 20 percent impaired, limited or restricted    Rada Hay 03/21/2016, 10:33 AM

## 2016-03-21 NOTE — Op Note (Signed)
NAME:  Shelly [name], Shelly [name]             ACCOUNT NO.:  [id number]  MEDICAL RECORD NO.:  [id number]  LOCATION:  1512                         [facility]  PHYSICIAN:  Madlyn FrankelMatthew D. Charlann Boxerlin, M.D.  DATE OF BIRTH:  [birth year]  DATE OF PROCEDURE:  03/20/2016 DATE OF DISCHARGE:                              OPERATIVE REPORT   PREOPERATIVE DIAGNOSIS:  Dislocation of right polyethylene insert following partial knee arthroplasty.  POSTOPERATIVE DIAGNOSIS:  Dislocation of right polyethylene insert following partial knee arthroplasty.  PROCEDURE:  Revision right partial knee arthroplasty with polyethylene half drape from the size 4 to size 5 insert.  FINDINGS:  There were no significant abnormal findings in the knee.  No remaining meniscal remnant.  No bony fragments in the posterior aspect of the joint.  SURGEON:  Madlyn FrankelMatthew D. Charlann Boxerlin, M.D.  ASSISTANT:  Surgical team.  ANESTHESIA:  Spinal block with IV sedation.  TIME OF TOURNIQUET:  18 minutes at 250 mmHg.  BLOOD LOSS:  Minimal.  DRAINS:  None.  COMPLICATION:  None.  INDICATIONS FOR PROCEDURE:  Shelly [name] is a 45 year old female with history of right partial knee arthroplasty by myself with [employer] knee components in October 2016.  She had been doing exceptionally well until this past week when she was doing some stretching maneuvers do Pilates.  She felt a strain in the knee and then 2 days later, felt a pop in the knee recognizing that she had inability to extend her knee.  It was difficult to determine when the polyethylene may have subluxated out of the joint, but nonetheless, there was a fairly abnormal strain to the joint at this point, though there was not any recommendations from myself not to do that.  She was seen and evaluated in the emergency room, admitted to the hospital.  There was a plan for surgery today. Risks, benefits and necessity of the procedure were discussed and reviewed.  Consent was obtained for benefit  of pain relief and improved function.  PROCEDURE IN DETAIL:  The patient was brought to the operative theater. Once adequate anesthesia, preoperative antibiotics, Ancef administered, she was positioned supine with a right thigh tourniquet placed.  The right lower extremity was draped over the Oxford leg holder for the procedure.  The right lower extremity was then prepped and draped in sterile fashion.  Time-out was performed identifying the patient, planned procedure, and extremity.  Midline incision was made through her old incision followed by soft tissue dissection.  Median arthrotomy was made encountering a slightly blood-tinged normal synovial fluid.  The old polyethylene was recognized anteriorly.  The insert was removed.  I then exposed the knee and removed some synovium and scarring medially.  I examined the knee and did not find any gross abnormalities to the metal components or soft tissue wise.  I then irrigated the knee.  I selected it, upsized from a size 4-5 insert as I do not feel this is going to cause any significant complication by loading this joint little bit further.  The size 5 insert to match the extra-small femur on this right medial knee was then snapped into place.  I injected the synovial capsule junction with 0.25% Marcaine with epinephrine,  1 mL of Toradol and 30 mL of normal saline for total of 61 mL.  The capsule was then reapproximated using a combination of #1 Vicryl and 0 V-Loc suture.  The remainder of the wound was closed with 2-0 Vicryl and running 4-0 Monocryl.  The knee was cleaned, dried and dressed sterilely with surgical glue and an Aquacel dressing.  She was then brought to the recovery room with Ace wrap on her knee.  She tolerated the procedure well.  She will be admitted to the hospital overnight for observation and antibiotics, and will be discharged tomorrow.     Madlyn Frankel Charlann Boxer, M.D.     MDO/MEDQ  D:  03/20/2016  T:   03/21/2016  Job:  [id number]

## 2016-03-21 NOTE — Discharge Summary (Signed)
Physician Discharge Summary  Patient ID: Shelly [name] MRN: [id number] DOB/AGE: 44/44/1972 44 y.o.  Admit date: 03/19/2016 Discharge date: 03/21/2016  Admission Diagnoses:knee injury  Discharge Diagnoses:  Active Problems:   Knee injury   Failed total knee replacement [facility])   Discharged Condition: good  Hospital Course:  Shelly [name] is a 45 y.o. who was admitted to [facility]. They were brought to the operating room on 03/19/2016 - 03/20/2016 and underwent Procedure(s): RIGHT UNI KNEE POLY EXCHANGE.  Patient tolerated the procedure well and was later transferred to the recovery room and then to the orthopaedic floor for postoperative care.  They were given PO and IV analgesics for pain control following their surgery.  They were given 24 hours of postoperative antibiotics of  Anti-infectives    Start     Dose/Rate Route Frequency Ordered Stop   03/20/16 2000  ceFAZolin (ANCEF) IVPB 1 g/50 mL premix     1 g 100 mL/hr over 30 Minutes Intravenous Every 6 hours 03/20/16 1619 03/21/16 0303   03/20/16 1600  ceFAZolin (ANCEF) IVPB 2g/100 mL premix     2 g 200 mL/hr over 30 Minutes Intravenous  Once 03/20/16 1251 03/20/16 1403     and started on DVT prophylaxis.   Discharge planning consulted to help with postop disposition and equipment needs.  Patient had a good night on the evening of surgery and started to get up OOB with therapy on day one.  Dressing was with normal limits.   Patient was seen in rounds and was ready to go home. Consults: n/a  Significant Diagnostic Studies: routine  Treatments: routine  Discharge Exam: Blood pressure 112/69, pulse 51, temperature 98 F (36.7 C), temperature source Oral, resp. rate 18, height  (1.651 m), weight 81.6 kg (179 lb 14.3 oz), last menstrual period 03/05/2016, SpO2 97 %. Well nourished. Alert and oriented x3. RRR, Lungs clear, BS x4. Abdomen soft and non tender. Right Calf soft and non tender. Right knee dressing  C/D/I. No DVT signs. Compartment soft. No signs of infection.  Right LE grossly neurovascular intact.  Disposition: 01-Home or Self Care  Discharge Instructions    Call MD / Call 911    Complete by:  As directed   If you experience chest pain or shortness of breath, CALL 911 and be transported to the hospital emergency room.  If you develope a fever above 101 F, pus (white drainage) or increased drainage or redness at the wound, or calf pain, call your surgeon's office.     Constipation Prevention    Complete by:  As directed   Drink plenty of fluids.  Prune juice may be helpful.  You may use a stool softener, such as Colace (over the counter) 100 mg twice a day.  Use MiraLax (over the counter) for constipation as needed.     Diet - low sodium heart healthy    Complete by:  As directed      Discharge instructions    Complete by:  As directed   INSTRUCTIONS AFTER JOINT REPLACEMENT   Remove items at home which could result in a fall. This includes throw rugs or furniture in walking pathways ICE to the affected joint every three hours while awake for 30 minutes at a time, for at least the first 3-5 days, and then as needed for pain and swelling.  Continue to use ice for pain and swelling. You may notice swelling that will progress down to the foot and ankle.  This is normal after surgery.  Elevate your leg when you are not up walking on it.   Continue to use the breathing machine you got in the hospital (incentive spirometer) which will help keep your temperature down.  It is common for your temperature to cycle up and down following surgery, especially at night when you are not up moving around and exerting yourself.  The breathing machine keeps your lungs expanded and your temperature down.   DIET:  As you were doing prior to hospitalization, we recommend a well-balanced diet.  DRESSING / WOUND CARE / SHOWERING  Keep the surgical dressing until follow up.  The dressing is water proof, so you  can shower without any extra covering.  IF THE DRESSING FALLS OFF or the wound gets wet inside, change the dressing with sterile gauze.  Please use good hand washing techniques before changing the dressing.  Do not use any lotions or creams on the incision until instructed by your surgeon.    ACTIVITY  Increase activity slowly as tolerated, but follow the weight bearing instructions below.   No driving for 6 weeks or until further direction given by your physician.  You cannot drive while taking narcotics.  No lifting or carrying greater than 10 lbs. until further directed by your surgeon. Avoid periods of inactivity such as sitting longer than an hour when not asleep. This helps prevent blood clots.  You may return to work once you are authorized by your doctor.     WEIGHT BEARING   Weight bearing as tolerated with assist device (walker, cane, etc) as directed, use it as long as suggested by your surgeon or therapist, typically at least 4-6 weeks.   EXERCISES  Results after joint replacement surgery are often greatly improved when you follow the exercise, range of motion and muscle strengthening exercises prescribed by your doctor. Safety measures are also important to protect the joint from further injury. Any time any of these exercises cause you to have increased pain or swelling, decrease what you are doing until you are comfortable again and then slowly increase them. If you have problems or questions, call your caregiver or physical therapist for advice.   Rehabilitation is important following a joint replacement. After just a few days of immobilization, the muscles of the leg can become weakened and shrink (atrophy).  These exercises are designed to build up the tone and strength of the thigh and leg muscles and to improve motion. Often times heat used for twenty to thirty minutes before working out will loosen up your tissues and help with improving the range of motion but do not use  heat for the first two weeks following surgery (sometimes heat can increase post-operative swelling).   These exercises can be done on a training (exercise) mat, on the floor, on a table or on a bed. Use whatever works the best and is most comfortable for you.    Use music or television while you are exercising so that the exercises are a pleasant break in your day. This will make your life better with the exercises acting as a break in your routine that you can look forward to.   Perform all exercises about fifteen times, three times per day or as directed.  You should exercise both the operative leg and the other leg as well.   Exercises include:   Quad Sets - Tighten up the muscle on the front of the thigh (Quad) and hold for 5-10 seconds.  Straight Leg Raises - With your knee straight (if you were given a brace, keep it on), lift the leg to 60 degrees, hold for 3 seconds, and slowly lower the leg.  Perform this exercise against resistance later as your leg gets stronger.  Leg Slides: Lying on your back, slowly slide your foot toward your buttocks, bending your knee up off the floor (only go as far as is comfortable). Then slowly slide your foot back down until your leg is flat on the floor again.  Angel Wings: Lying on your back spread your legs to the side as far apart as you can without causing discomfort.  Hamstring Strength:  Lying on your back, push your heel against the floor with your leg straight by tightening up the muscles of your buttocks.  Repeat, but this time bend your knee to a comfortable angle, and push your heel against the floor.  You may put a pillow under the heel to make it more comfortable if necessary.   A rehabilitation program following joint replacement surgery can speed recovery and prevent re-injury in the future due to weakened muscles. Contact your doctor or a physical therapist for more information on knee rehabilitation.    CONSTIPATION  Constipation is defined  medically as fewer than three stools per week and severe constipation as less than one stool per week.  Even if you have a regular bowel pattern at home, your normal regimen is likely to be disrupted due to multiple reasons following surgery.  Combination of anesthesia, postoperative narcotics, change in appetite and fluid intake all can affect your bowels.   YOU MUST use at least one of the following options; they are listed in order of increasing strength to get the job done.  They are all available over the counter, and you may need to use some, POSSIBLY even all of these options:    Drink plenty of fluids (prune juice may be helpful) and high fiber foods Colace 100 mg by mouth twice a day  Senokot for constipation as directed and as needed Dulcolax (bisacodyl), take with full glass of water  Miralax (polyethylene glycol) once or twice a day as needed.  If you have tried all these things and are unable to have a bowel movement in the first 3-4 days after surgery call either your surgeon or your primary doctor.    If you experience loose stools or diarrhea, hold the medications until you stool forms back up.  If your symptoms do not get better within 1 week or if they get worse, check with your doctor.  If you experience "the worst abdominal pain ever" or develop nausea or vomiting, please contact the office immediately for further recommendations for treatment.   ITCHING:  If you experience itching with your medications, try taking only a single pain pill, or even half a pain pill at a time.  You can also use Benadryl over the counter for itching or also to help with sleep.   TED HOSE STOCKINGS:  Use stockings on both legs until for at least 2 weeks or as directed by physician office. They may be removed at night for sleeping.  MEDICATIONS:  See your medication summary on the "After Visit Summary" that nursing will review with you.  You may have some home medications which will be placed on hold  until you complete the course of blood thinner medication.  It is important for you to complete the blood thinner medication as prescribed.  PRECAUTIONS:  If you experience chest pain or shortness of breath - call 911 immediately for transfer to the hospital emergency department.   If you develop a fever greater that 101 F, purulent drainage from wound, increased redness or drainage from wound, foul odor from the wound/dressing, or calf pain - CONTACT YOUR SURGEON.                                                   FOLLOW-UP APPOINTMENTS:  If you do not already have a post-op appointment, please call the office for an appointment to be seen by your surgeon.  Guidelines for how soon to be seen are listed in your "After Visit Summary", but are typically between 1-4 weeks after surgery.  OTHER INSTRUCTIONS:   Knee Replacement:  Do not place pillow under knee, focus on keeping the knee straight while resting. CPM instructions: 0-90 degrees, 2 hours in the morning, 2 hours in the afternoon, and 2 hours in the evening. Place foam block, curve side up under heel at all times except when in CPM or when walking.  DO NOT modify, tear, cut, or change the foam block in any way.  MAKE SURE YOU:  Understand these instructions.  Get help right away if you are not doing well or get worse.    Thank you for letting us be a part of your medical care team.  It is a privilege we respect greatly.  We hope these instructions will help you stay on track for a fast and full recovery!     Increase activity slowly as tolerated    Complete by:  As directed             Medication List    STOP taking these medications        acetaminophen 500 MG tablet  Commonly known as:  TYLENOL      TAKE these medications        albuterol 108 (90 Base) MCG/ACT inhaler  Commonly known as:  PROVENTIL HFA  Inhale 1 puff into the lungs daily as needed.     aspirin 325 MG EC tablet  Take 1 tablet (325 mg total) by mouth daily  with breakfast.     budesonide-formoterol 160-4.5 MCG/ACT inhaler  Commonly known as:  SYMBICORT  Inhale 2 puffs into the lungs 2 (two) times daily.     calcium carbonate 500 MG chewable tablet  Commonly known as:  TUMS - dosed in mg elemental calcium  Chew 1 tablet by mouth as needed for indigestion or heartburn.     fluconazole 100 MG tablet  Commonly known as:  DIFLUCAN  Take 1 tablet (100 mg total) by mouth once. Repeat dose in 48-72 hour.     fluticasone furoate-vilanterol 200-25 MCG/INH Aepb  Commonly known as:  BREO ELLIPTA  Inhale 1 puff into the lungs daily.     HYDROcodone-acetaminophen 7.5-325 MG tablet  Commonly known as:  NORCO  Take 1 tablet by mouth every 4 (four) hours as needed (breakthrough pain).     montelukast 10 MG tablet  Commonly known as:  SINGULAIR  TAKE 1 TABLET BY MOUTH EVERY NIGHT AT BEDTIME     terconazole 0.4 % vaginal cream  Commonly known as:  TERAZOL 7  Place 1 applicator vaginally at bedtime.  Follow-up Information    Follow up with Shelda Pal, MD. Go in 2 weeks.   Specialty:  Orthopedic Surgery   Contact information:   [address] [place] [address] [phone]       Signed: Markham Jordan 03/21/2016, 7:30 AM

## 2016-03-21 NOTE — Progress Notes (Signed)
Pt discharged.  Accompanied by husband.  Patient leaving with two prescriptions.  Denies pain.  Room air.  A&O x4.  Reports understanding of discharge instructions.  No complaints.

## 2016-03-22 NOTE — Anesthesia Postprocedure Evaluation (Signed)
Anesthesia Post Note  Patient: Shelly [name]  Procedure(s) Performed: Procedure(s) (LRB): RIGHT UNI KNEE POLY EXCHANGE (Right)  Patient location during evaluation: PACU Anesthesia Type: Spinal Level of consciousness: oriented and awake and alert Pain management: pain level controlled Vital Signs Assessment: post-procedure vital signs reviewed and stable Respiratory status: spontaneous breathing, respiratory function stable and patient connected to nasal cannula oxygen Cardiovascular status: blood pressure returned to baseline and stable Postop Assessment: no headache and no backache Anesthetic complications: no    Last Vitals:  Filed Vitals:   03/21/16 0151 03/21/16 0634  BP: 114/61 112/69  Pulse: 59 51  Temp: 36.9 C 36.7 C  Resp: 18 18    Last Pain:  Filed Vitals:   03/21/16 1034  PainSc: 0-No pain                 Sonya Pucci S

## 2016-03-23 ENCOUNTER — Encounter (HOSPITAL_COMMUNITY): Payer: Self-pay | Admitting: Orthopedic Surgery

## 2016-05-12 ENCOUNTER — Ambulatory Visit: Payer: BLUE CROSS/BLUE SHIELD | Admitting: Certified Nurse Midwife

## 2016-06-03 NOTE — ED Provider Notes (Signed)
Medical screening examination/treatment/procedure(s) were performed by non-physician practitioner and as supervising physician I was immediately available for consultation/collaboration.   EKG Interpretation None        Jacalyn LefevreJulie Tullio Chausse, MD 06/03/16 1504

## 2019-07-03 ENCOUNTER — Ambulatory Visit (INDEPENDENT_AMBULATORY_CARE_PROVIDER_SITE_OTHER): Payer: Self-pay | Admitting: Family Medicine

## 2019-07-03 ENCOUNTER — Other Ambulatory Visit: Payer: Self-pay

## 2019-07-03 DIAGNOSIS — M2550 Pain in unspecified joint: Secondary | ICD-10-CM | POA: Insufficient documentation

## 2019-07-03 DIAGNOSIS — K219 Gastro-esophageal reflux disease without esophagitis: Secondary | ICD-10-CM

## 2019-07-03 DIAGNOSIS — F330 Major depressive disorder, recurrent, mild: Secondary | ICD-10-CM

## 2019-07-03 DIAGNOSIS — F3341 Major depressive disorder, recurrent, in partial remission: Secondary | ICD-10-CM | POA: Insufficient documentation

## 2019-07-03 MED ORDER — FLUOXETINE HCL 20 MG PO TABS: 20.0000 mg | ORAL_TABLET | Freq: Every day | ORAL | 0 refills | Status: DC

## 2019-07-03 NOTE — Progress Notes (Signed)
Virtual Visit via Video Note   I connected with [name] on 07/03/19 by a video enabled telemedicine application and verified that I am speaking with the correct person using two identifiers.  Location patient: home Location provider:work office Persons participating in the virtual visit: patient, provider  I discussed the limitations of evaluation and management by telemedicine and the availability of in person appointments. The patient expressed understanding and agreed to proceed.   HPI: [name] is a 48 yo female who is establishing care today. Former PCP: Dr Shelly [name] Last preventive visit was with gyn in 2017. GERD, she is on Omeprazole 20 mg daily as needed.  Denies abdominal pain, nausea, vomiting, changes in bowel habits, blood in stool or melena.  She has Hx of depression for 20+ years,exacerbated after divorce a few years ago. She was on Prozac low dose for years and did very well,then she decided to stopped medication,did fine for 4-5 years. Problem has been slowly getting worse for about 4 years but she felt like she was able to continue with no medication. 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  She has been irritable,"snaping " and yelling at her children and husband.  She is not sleeping well, stays in bed about 8-9 hours,sleeps about 6-7 hours.Attributed to husband's laud snoring. She has had intermittent death thoughts, not thinking about suicide but thinking that she is not afraid of dying.  She denies abnormal wt changes, fatigue,palpitations,dyspnea,CP,or dizziness.  Mother with Hx of depression. Denies FHx of bipolar disorder.  Depression screen PHQ 2/9 07/03/2019  Decreased Interest 0  Down, Depressed, Hopeless 2  PHQ - 2 Score 2  Altered sleeping 3  Tired, decreased energy 1  Change in appetite 1  Feeling bad or failure about yourself  0  Trouble concentrating 1  Moving slowly or fidgety/restless 0  Suicidal thoughts  1  PHQ-9 Score 9  Difficult doing work/chores Somewhat difficult   "Achy joints" for years,woirse as she gets older. Negative for fever,chills,skin rash,joint edema or erythema. Exacerbated by repetitive movement. No significant limitation of ROM.  ROS: See pertinent positives and negatives per HPI.  Past Medical History:  Diagnosis Date  . Anxiety   . Asthma   . Depression   . GERD (gastroesophageal reflux disease)   . Intermittent palpitations   . Loose body of right knee   . Osteochondral defect    right knee    Past Surgical History:  Procedure Laterality Date  . CESAREAN SECTION  06-03-2011//  09-27-2003//  2000   Bilateral Tubal Ligation w/ last c/s  . CHONDROPLASTY Right 06/11/2015   Procedure: CHONDROPLASTY;  Surgeon: Eugenia Mcalpineobert Collins, MD;  [facility];  Service: Orthopedics;  Laterality: Right;  . FOREIGN BODY REMOVAL Right 06/11/2015   Procedure: RIGHT REMOVAL LOOSE BODY;  Surgeon: Eugenia Mcalpineobert Collins, MD;  [facility];  Service: Orthopedics;  Laterality: Right;  . KNEE ARTHROSCOPY Right 1990  . KNEE ARTHROSCOPY Right 06/11/2015   Procedure: RIGHT ARTHROSCOPY KNEE;  Surgeon: Eugenia Mcalpineobert Collins, MD;  [facility];  Service: Orthopedics;  Laterality: Right;  . 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 PARTIAL KNEE ARTHROPLASTY Right 09/02/2015   Procedure: RIGHT UNI KNEE ARTHROPLASTY MEDIALLY;  Surgeon: Durene RomansMatthew Olin, MD;  Location: [facility];  Service: Orthopedics;  Laterality: Right;  . TOTAL KNEE REVISION Right 03/20/2016   Procedure: RIGHT UNI KNEE POLY EXCHANGE;  Surgeon: Durene RomansMatthew Olin, MD;  Location: [facility];  Service: Orthopedics;  Laterality: Right;  . WISDOM TOOTH EXTRACTION Bilateral     Family History  Problem Relation Age of Onset  . COPD Father   . Emphysema Father   . Arthritis Mother   . Alcohol abuse Paternal Grandmother   . Cancer Paternal Grandmother      Social History   Socioeconomic History  . Marital status: Married    Spouse name: Not on file  . Number of children: Not on file  . Years of education: Not on file  . Highest education level: Not on file  Occupational History  . Not on file  Social Needs  . Financial resource strain: Not on file  . Food insecurity    Worry: Not on file    Inability: Not on file  . Transportation needs    Medical: Not on file    Non-medical: Not on file  Tobacco Use  . Smoking status: Former Smoker    Years: 6.00    Types: Cigarettes    Quit date: 06/10/1995    Years since quitting: 24.0  . Smokeless tobacco: Never Used  Substance and Sexual Activity  . Alcohol use: Yes    Alcohol/week: 0.0 standard drinks    Comment: occasional  . Drug use: No  . Sexual activity: Yes    Partners: Male    Birth control/protection: Surgical  Lifestyle  . Physical activity    Days per week: Not on file    Minutes per session: Not on file  . Stress: Not on file  Relationships  . Social Musicianconnections    Talks on phone: Not on file    Gets together: Not on file    Attends religious service: Not on file    Active member of club or organization: Not on file    Attends meetings of clubs or organizations: Not on file    Relationship status: Not on file  . Intimate partner violence    Fear of current or ex partner: Not on file    Emotionally abused: Not on file    Physically abused: Not on file    Forced sexual activity: Not on file  Other Topics Concern  . Not on file  Social History Narrative  . Not on file    Current Outpatient Medications:  .  albuterol (PROVENTIL HFA) 108 (90 BASE) MCG/ACT inhaler, Inhale 1 puff into the lungs daily as needed. (Patient taking differently: Inhale 1 puff into the lungs every 4 (four) hours as needed for wheezing or shortness of breath. ), Disp: 1 Inhaler, Rfl: 6 .  aspirin EC 325 MG EC tablet, Take 1 tablet (325 mg total) by mouth daily with breakfast., Disp: 30  tablet, Rfl: 0 .  budesonide-formoterol (SYMBICORT) 160-4.5 MCG/ACT inhaler, Inhale 2 puffs into the lungs 2 (two) times daily., Disp: , Rfl:  .  calcium carbonate (TUMS - DOSED IN MG ELEMENTAL CALCIUM) 500 MG chewable tablet, Chew 1 tablet by mouth as needed for indigestion or heartburn., Disp: , Rfl:  .  FLUoxetine (PROZAC) 20 MG tablet, Take 1 tablet (20 mg total) by mouth daily., Disp: 90 tablet, Rfl: 0 .  HYDROcodone-acetaminophen (NORCO) 7.5-325 MG tablet, Take 1 tablet by mouth every 4 (four) hours as needed (breakthrough pain)., Disp: 40 tablet, Rfl: 0 .  montelukast (SINGULAIR) 10 MG tablet, TAKE 1 TABLET BY MOUTH EVERY NIGHT AT BEDTIME (Patient taking differently: TAKE 10 MG BY MOUTH EVERY NIGHT AT BEDTIME),  Disp: 90 tablet, Rfl: 1  EXAM:  VITALS per patient if applicable:N/A  GENERAL: alert, oriented, appears well and in no acute distress  HEENT: atraumatic, conjunctiva clear, no obvious facial abnormalities on inspection.  NECK: normal movements of the head and neck  LUNGS: on inspection no signs of respiratory distress, breathing rate appears normal, no obvious gross SOB, gasping or wheezing  CV: no obvious cyanosis  MS: moves all visible extremities without noticeable abnormality. No signs of synovitis.   PSYCH/NEURO: pleasant and cooperative, labile, no obvious anxiety, speech and thought processing grossly intact  ASSESSMENT AND PLAN:  Discussed the following assessment and plan:  Gastroesophageal reflux disease, esophagitis presence not specified - Plan: Well controlled with Omeprazole 20 mg. GERD precautions also recommended.  Mild episode of recurrent depressive disorder ([place]) - Plan: She has tried Zoloft in the past and did not help. She has no insurance, so she wants to try Fluoxetine again because it really help. She understands warning signs and when to seek immediate medical attention. F/U in 3 months ,before if needed.  Arthralgia, unspecified joint -  Plan: Most likely OA. Tylenol 500 mg tid.   I discussed the assessment and treatment plan with the patient. She was provided an opportunity to ask questions and all were answered. She agreed with the plan and demonstrated an understanding of the instructions.   The patient was advised to call back or seek an in-person evaluation if the symptoms worsen or if the condition fails to improve as anticipated.  Return in about 3 months (around 10/03/2019) for 2-3 months, no insurance.     [place], MD

## 2019-07-06 ENCOUNTER — Encounter: Payer: Self-pay | Admitting: Family Medicine

## 2019-09-27 ENCOUNTER — Other Ambulatory Visit: Payer: Self-pay | Admitting: Family Medicine

## 2019-09-27 DIAGNOSIS — F330 Major depressive disorder, recurrent, mild: Secondary | ICD-10-CM

## 2019-09-27 NOTE — Telephone Encounter (Signed)
I have scheduled the pt for follow up.  Rx sent to the pharmacy by e-scribe for 90 days.

## 2019-10-03 ENCOUNTER — Encounter: Payer: Self-pay | Admitting: Family Medicine

## 2019-10-03 ENCOUNTER — Telehealth (INDEPENDENT_AMBULATORY_CARE_PROVIDER_SITE_OTHER): Payer: Self-pay | Admitting: Family Medicine

## 2019-10-03 ENCOUNTER — Other Ambulatory Visit: Payer: Self-pay

## 2019-10-03 DIAGNOSIS — F330 Major depressive disorder, recurrent, mild: Secondary | ICD-10-CM

## 2019-10-03 DIAGNOSIS — G4700 Insomnia, unspecified: Secondary | ICD-10-CM

## 2019-10-03 MED ORDER — FLUOXETINE HCL 20 MG PO TABS: ORAL_TABLET | ORAL | 3 refills | Status: DC

## 2019-10-03 NOTE — Progress Notes (Signed)
Virtual Visit via Video Note  I connected with [name] on 10/03/19 by a video enabled telemedicine application and verified that I am speaking with the correct person using two identifiers.  Location patient: home Location provider:work office Persons participating in the virtual visit: patient, provider  I discussed the limitations of evaluation and management by telemedicine and the availability of in person appointments. The patient expressed understanding and agreed to proceed.   HPI: [name] is a 48 yo female following on last visit. She was last seen on 07/03/2019, when fluoxetine 20 mg was resumed for anxiety and depression. Long history of depression and anxiety, 20+ years. She has taking fluoxetine in the past. She has noted great improvement since she started fluoxetine. She has tolerated medication well.  For the first couple of weeks she noted frequent yawning, resolved.  Negative for depressed mood or suicidal thoughts.  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  She is not sleeping well, 5 to 6 hours max. She takes melatonin at bedtime. + Fatigue.  ROS: See pertinent positives and negatives per HPI.  Past Medical History:  Diagnosis Date  . Anxiety   . Asthma   . Depression   . GERD (gastroesophageal reflux disease)   . Intermittent palpitations   . Loose body of right knee   . Osteochondral defect    right knee    Past Surgical History:  Procedure Laterality Date  . CESAREAN SECTION  06-03-2011//  09-27-2003//  2000   Bilateral Tubal Ligation w/ last c/s  . CHONDROPLASTY Right 06/11/2015   Procedure: CHONDROPLASTY;  Surgeon: Eugenia Mcalpine, MD;  Location: [facility];  Service: Orthopedics;  Laterality: Right;  . FOREIGN BODY REMOVAL Right 06/11/2015   Procedure: RIGHT REMOVAL LOOSE BODY;  Surgeon: Eugenia Mcalpine, MD;  Location: [facility];  Service: Orthopedics;  Laterality: Right;  . KNEE ARTHROSCOPY Right 1990  . KNEE ARTHROSCOPY Right 06/11/2015   Procedure: RIGHT ARTHROSCOPY KNEE;  Surgeon: Eugenia Mcalpine, MD;  Location: [facility]  Service: Orthopedics;  Laterality: Right;  . 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 PARTIAL KNEE ARTHROPLASTY Right 09/02/2015   Procedure: RIGHT UNI KNEE ARTHROPLASTY MEDIALLY;  Surgeon: Durene Romans, MD;  Location: [facility];  Service: Orthopedics;  Laterality: Right;  . TOTAL KNEE REVISION Right 03/20/2016   Procedure: RIGHT UNI KNEE POLY EXCHANGE;  Surgeon: Durene Romans, MD;  Location: [facility];  Service: Orthopedics;  Laterality: Right;  . WISDOM TOOTH EXTRACTION Bilateral     Family History  Problem Relation Age of Onset  . COPD Father   . Emphysema Father   . Arthritis Mother   . Alcohol abuse Paternal Grandmother   . Cancer Paternal Grandmother     Social History   Socioeconomic History  . Marital status: Married    Spouse name: Not on file  . Number of children: Not on file  . Years of education: Not on file  . Highest education level: Not on file  Occupational History  . Not on file  Social Needs  . Financial resource strain: Not on file  . Food insecurity    Worry: Not on file    Inability: Not on file  . Transportation needs    Medical: Not on file    Non-medical: Not on file  Tobacco Use  . Smoking  status: Former Smoker    Years: 6.00    Types: Cigarettes    Quit date: 06/10/1995    Years since quitting: 24.3  . Smokeless tobacco: Never Used  Substance and Sexual Activity  . Alcohol use: Yes    Alcohol/week: 0.0 standard drinks    Comment: occasional  . Drug use: No  . Sexual activity: Yes    Partners: Male    Birth control/protection: Surgical  Lifestyle  . Physical activity    Days per week: Not on file    Minutes per session: Not on file  . Stress: Not on file  Relationships   . Social Musicianconnections    Talks on phone: Not on file    Gets together: Not on file    Attends religious service: Not on file    Active member of club or organization: Not on file    Attends meetings of clubs or organizations: Not on file    Relationship status: Not on file  . Intimate partner violence    Fear of current or ex partner: Not on file    Emotionally abused: Not on file    Physically abused: Not on file    Forced sexual activity: Not on file  Other Topics Concern  . Not on file  Social History Narrative  . Not on file    Current Outpatient Medications:  .  albuterol (PROVENTIL HFA) 108 (90 BASE) MCG/ACT inhaler, Inhale 1 puff into the lungs daily as needed. (Patient taking differently: Inhale 1 puff into the lungs every 4 (four) hours as needed for wheezing or shortness of breath. ), Disp: 1 Inhaler, Rfl: 6 .  aspirin EC 325 MG EC tablet, Take 1 tablet (325 mg total) by mouth daily with breakfast., Disp: 30 tablet, Rfl: 0 .  budesonide-formoterol (SYMBICORT) 160-4.5 MCG/ACT inhaler, Inhale 2 puffs into the lungs 2 (two) times daily., Disp: , Rfl:  .  calcium carbonate (TUMS - DOSED IN MG ELEMENTAL CALCIUM) 500 MG chewable tablet, Chew 1 tablet by mouth as needed for indigestion or heartburn., Disp: , Rfl:  .  FLUoxetine (PROZAC) 20 MG tablet, TAKE 1 TABLET(20 MG) BY MOUTH DAILY, Disp: 90 tablet, Rfl: 3 .  HYDROcodone-acetaminophen (NORCO) 7.5-325 MG tablet, Take 1 tablet by mouth every 4 (four) hours as needed (breakthrough pain)., Disp: 40 tablet, Rfl: 0 .  montelukast (SINGULAIR) 10 MG tablet, TAKE 1 TABLET BY MOUTH EVERY NIGHT AT BEDTIME (Patient taking differently: TAKE 10 MG BY MOUTH EVERY NIGHT AT BEDTIME), Disp: 90 tablet, Rfl: 1  EXAM:  VITALS per patient if applicable:N/A  GENERAL: alert, oriented, appears well and in no acute distress  HEENT: atraumatic, conjunctiva clear, no obvious abnormalities on inspection.  LUNGS: on inspection no signs of respiratory  distress, breathing rate appears normal, no obvious gross SOB, gasping or wheezing  CV: no obvious cyanosis   PSYCH/NEURO: pleasant and cooperative, no obvious depression or anxiety, speech and thought processing grossly intact  ASSESSMENT AND PLAN:  Discussed the following assessment and plan:  Insomnia, unspecified type Good sleep hygiene recommended. She can continue melatonin 5 to 10 mg but recommend taking it with empty stomach, 1 to 2 hours after supper.  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 Continue fluoxetine 20 mg daily. Given the fact she has been on same medication in the past, well-tolerated and symptoms well controlled while she was taking medication, I think it is appropriate to follow annually,  before if needed. Instructed about warning signs.  Recommend arranging appointment for CPE at her convenience.    I discussed the assessment and treatment plan with the patient. She was provided an opportunity to ask questions and all were answered. She  agreed with the plan and demonstrated an understanding of the instructions.   The patient was advised to call back or seek an in-person evaluation if the symptoms worsen or if the condition fails to improve as anticipated.  Return in about 1 year (around 10/02/2020) for Before if needed. Needs CPE.    Seryna Marek Martin[place], MD

## 2020-01-01 ENCOUNTER — Telehealth: Payer: Self-pay | Admitting: Family Medicine

## 2020-01-03 ENCOUNTER — Ambulatory Visit: Payer: Self-pay | Attending: Internal Medicine

## 2020-01-03 DIAGNOSIS — Z20822 Contact with and (suspected) exposure to covid-19: Secondary | ICD-10-CM | POA: Insufficient documentation

## 2020-01-04 LAB — NOVEL CORONAVIRUS, NAA: SARS-CoV-2, NAA: NOT DETECTED

## 2020-12-26 ENCOUNTER — Other Ambulatory Visit: Payer: Self-pay | Admitting: Family Medicine

## 2020-12-26 DIAGNOSIS — F330 Major depressive disorder, recurrent, mild: Secondary | ICD-10-CM

## 2021-03-27 ENCOUNTER — Other Ambulatory Visit: Payer: Self-pay | Admitting: Family Medicine

## 2021-03-27 DIAGNOSIS — F330 Major depressive disorder, recurrent, mild: Secondary | ICD-10-CM

## 2021-05-08 ENCOUNTER — Telehealth: Payer: Self-pay | Admitting: Family Medicine

## 2021-05-08 NOTE — Telephone Encounter (Signed)
Patient is calling and is requesting a refill for LUoxetine (PROZAC) 20 MG capsule to be sent to   [id number] - [place], [place] - [address] [facility] [address]  [address] [place], [place] [address]  Phone:  [phone]  Fax:  [phone]  CB is [phone]

## 2021-05-09 ENCOUNTER — Other Ambulatory Visit: Payer: Self-pay

## 2021-05-09 DIAGNOSIS — F330 Major depressive disorder, recurrent, mild: Secondary | ICD-10-CM

## 2021-05-09 MED ORDER — FLUOXETINE HCL 20 MG PO CAPS: ORAL_CAPSULE | ORAL | 0 refills | Status: DC

## 2021-05-09 NOTE — Telephone Encounter (Signed)
Rx sent 

## 2021-06-08 ENCOUNTER — Other Ambulatory Visit: Payer: Self-pay | Admitting: Family Medicine

## 2021-06-08 DIAGNOSIS — F330 Major depressive disorder, recurrent, mild: Secondary | ICD-10-CM

## 2021-06-09 ENCOUNTER — Other Ambulatory Visit: Payer: Self-pay

## 2021-06-10 ENCOUNTER — Encounter: Payer: Self-pay | Admitting: Family Medicine

## 2021-06-10 ENCOUNTER — Ambulatory Visit (INDEPENDENT_AMBULATORY_CARE_PROVIDER_SITE_OTHER): Payer: Self-pay | Admitting: Family Medicine

## 2021-06-10 DIAGNOSIS — F3341 Major depressive disorder, recurrent, in partial remission: Secondary | ICD-10-CM

## 2021-06-10 DIAGNOSIS — F419 Anxiety disorder, unspecified: Secondary | ICD-10-CM

## 2021-06-10 MED ORDER — FLUOXETINE HCL 20 MG PO CAPS: ORAL_CAPSULE | ORAL | 3 refills | Status: DC

## 2021-06-10 NOTE — Progress Notes (Signed)
HPI: Shelly [name] is a 50 y.o. female, who is here today for follow up on depression and anxiety.  She was last seen on 10/03/19. No new problems since her last visit. Long history of depression and anxiety, 20+ years. She has taking fluoxetine 20 mg daily intermittently for years.Medication is still helping. Negative for side effects.  Depression screen [facility] 2/9 06/10/2021 07/03/2019  Decreased Interest 0 0  Down, Depressed, Hopeless 0 2  PHQ - 2 Score 0 2  Altered sleeping 3 3  Tired, decreased energy 0 1  Change in appetite 0 1  Feeling bad or failure about yourself  3 0  Trouble concentrating 0 1  Moving slowly or fidgety/restless 0 0  Suicidal thoughts 0 1  PHQ-9 Score 6 9  Difficult doing work/chores Somewhat difficult Somewhat difficult   GAD 7 : Generalized Anxiety Score 06/10/2021  Nervous, Anxious, on Edge 1  Control/stop worrying 1  Worry too much - different things 1  Trouble relaxing 1  Restless 0  Easily annoyed or irritable 0  Afraid - awful might happen 0  Total GAD 7 Score 4  Anxiety Difficulty Not difficult at all   Review of Systems  Constitutional:  Positive for fatigue. Negative for activity change, appetite change and fever.  HENT:  Negative for mouth sores and nosebleeds.   Respiratory:  Negative for cough, shortness of breath and wheezing.   Cardiovascular:  Negative for palpitations.  Gastrointestinal:  Negative for abdominal pain, nausea and vomiting.       Negative for changes in bowel habits.  Neurological:  Negative for syncope, weakness and headaches.  Psychiatric/Behavioral:  Negative for confusion, hallucinations and suicidal ideas.   Rest of ROS, see pertinent positives sand negatives in HPI  Current Outpatient Medications on File Prior to Visit  Medication Sig Dispense Refill   albuterol (PROVENTIL HFA) 108 (90 BASE) MCG/ACT inhaler Inhale 1 puff into the lungs daily as needed. (Patient taking differently: Inhale 1 puff into  the lungs every 4 (four) hours as needed for wheezing or shortness of breath.) 1 Inhaler 6   aspirin EC 325 MG EC tablet Take 1 tablet (325 mg total) by mouth daily with breakfast. 30 tablet 0   budesonide-formoterol (SYMBICORT) 160-4.5 MCG/ACT inhaler Inhale 2 puffs into the lungs 2 (two) times daily.     calcium carbonate (TUMS - DOSED IN MG ELEMENTAL CALCIUM) 500 MG chewable tablet Chew 1 tablet by mouth as needed for indigestion or heartburn.     montelukast (SINGULAIR) 10 MG tablet TAKE 1 TABLET BY MOUTH EVERY NIGHT AT BEDTIME 90 tablet 1   No current facility-administered medications on file prior to visit.   Past Medical History:  Diagnosis Date   Anxiety    Asthma    Depression    GERD (gastroesophageal reflux disease)    Intermittent palpitations    Loose body of right knee    Osteochondral defect    right knee   No Known Allergies  Social History   Socioeconomic History   Marital status: Married    Spouse name: Not on file   Number of children: Not on file   Years of education: Not on file   Highest education level: Not on file  Occupational History   Not on file  Tobacco Use   Smoking status: Former    Years: 6.00    Types: Cigarettes    Quit date: 06/10/1995    Years since quitting: 26.0  Smokeless tobacco: Never  Substance and Sexual Activity   Alcohol use: Yes    Alcohol/week: 0.0 standard drinks    Comment: occasional   Drug use: No   Sexual activity: Yes    Partners: Male    Birth control/protection: Surgical  Other Topics Concern   Not on file  Social History Narrative   Not on file   Social Determinants of Health   Financial Resource Strain: Not on file  Food Insecurity: Not on file  Transportation Needs: Not on file  Physical Activity: Not on file  Stress: Not on file  Social Connections: Not on file   Vitals:   06/10/21 1031  BP: 130/80  Pulse: 77  Resp: 16  Temp: 97.9 F (36.6 C)  SpO2: 97%   Body mass index is 35.28  kg/m.  Physical Exam Vitals and nursing note reviewed.  Constitutional:      General: She is not in acute distress.    Appearance: She is well-developed.  HENT:     Head: Normocephalic and atraumatic.  Eyes:     Conjunctiva/sclera: Conjunctivae normal.  Cardiovascular:     Rate and Rhythm: Normal rate and regular rhythm.     Heart sounds: No murmur heard. Pulmonary:     Effort: Pulmonary effort is normal. No respiratory distress.     Breath sounds: Normal breath sounds.  Abdominal:     Palpations: Abdomen is soft. There is no mass.     Tenderness: There is no abdominal tenderness.  Skin:    General: Skin is warm.     Findings: No erythema or rash.  Neurological:     General: No focal deficit present.     Mental Status: She is alert and oriented to person, place, and time.     Gait: Gait normal.  Psychiatric:     Comments: Well groomed, good eye contact.   ASSESSMENT AND PLAN:  Shelly [name] was seen today for follow-up.  Diagnoses and all orders for this visit:  Depression, major, recurrent, in partial remission (HCC) Problem is stable and adequately controlled. Continue Fluoxetine 20 mg daily. As far as symptoms are stable, annual follow up is appropriate.  -     FLUoxetine (PROZAC) 20 MG capsule; TAKE 1 CAPSULE BY MOUTH EVERY DAY  Anxiety disorder, unspecified type Stable. No changes in Fluoxetine dose.  -     FLUoxetine (PROZAC) 20 MG capsule; TAKE 1 CAPSULE BY MOUTH EVERY DAY  Return in about 1 year (around 06/10/2022).  Haruko Mersch G. [place], MD  [facility]. [place] office.

## 2021-06-10 NOTE — Patient Instructions (Signed)
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 Continue annual follow ups.  Do not use My Chart to request refills or for acute issues that need immediate attention.   Please be sure medication list is accurate. If a new problem present, please set up appointment sooner than planned today.

## 2021-08-23 DIAGNOSIS — H8113 Benign paroxysmal vertigo, bilateral: Secondary | ICD-10-CM

## 2021-08-23 HISTORY — DX: Benign paroxysmal vertigo, bilateral: H81.13

## 2021-09-18 ENCOUNTER — Ambulatory Visit: Payer: No Typology Code available for payment source | Admitting: Adult Health

## 2021-09-18 ENCOUNTER — Other Ambulatory Visit: Payer: Self-pay

## 2021-09-18 ENCOUNTER — Encounter: Payer: Self-pay | Admitting: Adult Health

## 2021-09-18 VITALS — BP 110/80 | HR 73 | Temp 99.0°F | Ht 65.0 in | Wt 209.0 lb

## 2021-09-18 DIAGNOSIS — H8113 Benign paroxysmal vertigo, bilateral: Secondary | ICD-10-CM

## 2021-09-18 NOTE — Patient Instructions (Signed)
Your symptoms are consistent with benign vertigo.   I am glad you are feeling better   I have printed off exercises to do at home in case this ever comes back.   You can also use Dramamine

## 2021-09-18 NOTE — Progress Notes (Signed)
Subjective:    Patient ID: Shelly Shelly [name], female    DOB: [birth year], 50 y.o.   MRN: [id number]  HPI 50 year old female who  has a past medical history of Anxiety, Asthma, Depression, GERD (gastroesophageal reflux disease), Intermittent palpitations, Loose body of right knee, and Osteochondral defect.  She is a patient of Dr. [place] who I am seeing today for an acute issue.  Yesterday while at work she became dizzy and felt as though the world was spinning around Shelly [name].  She tried to stay focused on a stationary object but the dizziness became worse and she had to sit on the ground while at work.  Shelly [name] husband came and picked Shelly [name] up and took Shelly [name] home, when she got home the dizziness became worse and she had some nausea.  Whole episode lasted about 8 hours.  In the past she has had episodes of dizziness that were momentary and nothing is lasted near as long as this episode did.  She denies vomiting, palpitations, chest pain, shortness of breath, or headaches.  She does have chronic tinnitus.  She is currently back to baseline without dizziness   Review of Systems See HPI   Past Medical History:  Diagnosis Date   Anxiety    Asthma    Depression    GERD (gastroesophageal reflux disease)    Intermittent palpitations    Loose body of right knee    Osteochondral defect    right knee    Social History   Socioeconomic History   Marital status: Married    Spouse name: Not on file   Number of children: Not on file   Years of education: Not on file   Highest education level: Not on file  Occupational History   Not on file  Tobacco Use   Smoking status: Former    Years: 6.00    Types: Cigarettes    Quit date: 06/10/1995    Years since quitting: 26.2   Smokeless tobacco: Never  Substance and Sexual Activity   Alcohol use: Yes    Alcohol/week: 0.0 standard drinks    Comment: occasional   Drug use: No   Sexual activity: Yes    Partners: Male    Birth control/protection: Surgical   Other Topics Concern   Not on file  Social History Narrative   Not on file   Social Determinants of Health   Financial Resource Strain: Not on file  Food Insecurity: Not on file  Transportation Needs: Not on file  Physical Activity: Not on file  Stress: Not on file  Social Connections: Not on file  Intimate Partner Violence: Not on file    Past Surgical History:  Procedure Laterality Date   CESAREAN SECTION  06-03-2011//  09-27-2003//  2000   Bilateral Tubal Ligation w/ last c/s   CHONDROPLASTY Right 06/11/2015   Procedure: CHONDROPLASTY;  Surgeon: Eugenia Mcalpine, MD;  Location[facility];  Service: Orthopedics;  Laterality: Right;   FOREIGN BODY REMOVAL Right 06/11/2015   Procedure: RIGHT REMOVAL LOOSE BODY;  Surgeon: Eugenia Mcalpine, MD;  Location: [facility];  Service: Orthopedics;  Laterality: Right;   KNEE ARTHROSCOPY Right 1990   KNEE ARTHROSCOPY Right 06/11/2015   Procedure: RIGHT ARTHROSCOPY KNEE;  Surgeon: Eugenia Mcalpine, MD;  Location: [facility];  Service: Orthopedics;  Laterality: Right;   LEFT WRIST REDUCTION AND FUSION   07-19-2008   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   PARTIAL KNEE ARTHROPLASTY Right 09/02/2015  Procedure: RIGHT UNI KNEE ARTHROPLASTY MEDIALLY;  Surgeon: Durene Romans, MD;  Location: [facility];  Service: Orthopedics;  Laterality: Right;   TOTAL KNEE REVISION Right 03/20/2016   Procedure: RIGHT UNI KNEE POLY EXCHANGE;  Surgeon: Durene Romans, MD;  Location: [facility];  Service: Orthopedics;  Laterality: Right;   WISDOM TOOTH EXTRACTION Bilateral     Family History  Problem Relation Age of Onset   COPD Father    Emphysema Father    Arthritis Mother    Alcohol abuse Paternal Grandmother    Cancer Paternal Grandmother     No Known Allergies  Current Outpatient Medications on File Prior to Visit  Medication Sig Dispense Refill   albuterol (PROVENTIL HFA) 108 (90 BASE) MCG/ACT inhaler Inhale 1 puff into the  lungs daily as needed. (Patient taking differently: Inhale 1 puff into the lungs every 4 (four) hours as needed for wheezing or shortness of breath.) 1 Inhaler 6   budesonide-formoterol (SYMBICORT) 160-4.5 MCG/ACT inhaler Inhale 2 puffs into the lungs 2 (two) times daily.     calcium carbonate (TUMS - DOSED IN MG ELEMENTAL CALCIUM) 500 MG chewable tablet Chew 1 tablet by mouth as needed for indigestion or heartburn.     FLUoxetine (PROZAC) 20 MG capsule TAKE 1 CAPSULE BY MOUTH EVERY DAY 90 capsule 3   montelukast (SINGULAIR) 10 MG tablet TAKE 1 TABLET BY MOUTH EVERY NIGHT AT BEDTIME 90 tablet 1   aspirin EC 325 MG EC tablet Take 1 tablet (325 mg total) by mouth daily with breakfast. 30 tablet 0   No current facility-administered medications on file prior to visit.    BP 110/80   Pulse 73   Temp 99 F (37.2 C) (Oral)   Ht 5\' 5"  (1.651 m)   Wt 209 lb (94.8 kg)   LMP 03/05/2016 (Approximate)   SpO2 97%   BMI 34.78 kg/m       Objective:   Physical Exam Vitals and nursing note reviewed.  Constitutional:      Appearance: Normal appearance.  HENT:     Head: Normocephalic and atraumatic.     Right Ear: Tympanic membrane and ear canal normal. There is no impacted cerumen.     Left Ear: Tympanic membrane, ear canal and external ear normal.     Mouth/Throat:     Mouth: Mucous membranes are moist.     Pharynx: Oropharynx is clear.  Eyes:     Extraocular Movements: Extraocular movements intact.     Right eye: Nystagmus present.     Left eye: Nystagmus present.     Conjunctiva/sclera: Conjunctivae normal.     Pupils: Pupils are equal, round, and reactive to light.  Cardiovascular:     Rate and Rhythm: Normal rate and regular rhythm.     Pulses: Normal pulses.     Heart sounds: Normal heart sounds.  Pulmonary:     Effort: Pulmonary effort is normal.     Breath sounds: Normal breath sounds.  Neurological:     Mental Status: She is alert.  Psychiatric:        Mood and Affect: Mood  normal.        Behavior: Behavior normal.        Thought Content: Thought content normal.        Judgment: Judgment normal.      Assessment & Plan:  1. Benign paroxysmal positional vertigo due to bilateral vestibular disorder -Mild horizontal nystagmus still present.  Symptoms likely BPPV, cannot rule out Mnire's.  Advised  to stay hydrated.  Instructions for home Epley maneuver given.  Did not want any medication today.   Shirline Frees, NP

## 2021-09-28 ENCOUNTER — Encounter (HOSPITAL_COMMUNITY): Payer: Self-pay | Admitting: Emergency Medicine

## 2021-09-28 ENCOUNTER — Emergency Department (HOSPITAL_COMMUNITY)
Admission: EM | Admit: 2021-09-28 | Discharge: 2021-09-29 | Disposition: A | Discharge status: Home / Self Care | Payer: No Typology Code available for payment source | Attending: Emergency Medicine | Admitting: Emergency Medicine

## 2021-09-28 ENCOUNTER — Other Ambulatory Visit: Payer: Self-pay

## 2021-09-28 ENCOUNTER — Emergency Department (HOSPITAL_COMMUNITY): Payer: No Typology Code available for payment source

## 2021-09-28 DIAGNOSIS — M25561 Pain in right knee: Secondary | ICD-10-CM | POA: Insufficient documentation

## 2021-09-28 DIAGNOSIS — Z7951 Long term (current) use of inhaled steroids: Secondary | ICD-10-CM | POA: Diagnosis not present

## 2021-09-28 DIAGNOSIS — Z87891 Personal history of nicotine dependence: Secondary | ICD-10-CM | POA: Insufficient documentation

## 2021-09-28 DIAGNOSIS — J45909 Unspecified asthma, uncomplicated: Secondary | ICD-10-CM | POA: Diagnosis not present

## 2021-09-28 MED ORDER — OXYCODONE-ACETAMINOPHEN 5-325 MG PO TABS
1.0000 | ORAL_TABLET | Freq: Once | ORAL | Status: AC
  Administered 2021-09-28: 1 via ORAL
  Filled 2021-09-28: qty 1

## 2021-09-28 NOTE — ED Triage Notes (Signed)
Per EMS, pt from home was trying to sit on the couch and while trying to move her right knee she heard a "Pop."  She reports sharp stabbing pain and can't move it.   9/10 pain fentanyl. 4mg  zofran 18G left AC  154/90 Hr 68 95%RA 16RR

## 2021-09-28 NOTE — ED Triage Notes (Signed)
Pt reports no pain when "I"m not moving it."  2 past surgeries on right knee in 2016 and 2017.

## 2021-09-28 NOTE — ED Provider Notes (Signed)
Emergency Medicine Provider Triage Evaluation Note  Shelly [name] , a 51 y.o. female  was evaluated in triage.  Pt complains of right knee pop earlier tonight with significant pain, inability to bend right knee since injury. 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 Received fentanyl in route via EMS. Unable to bear weight. Minimal pain at rest.  Review of Systems  Positive: Right knee pain Negative: Numbness, tingling, poikilothermia  Physical Exam  BP 129/71 (BP Location: Left Arm)   Pulse 69   Temp 98.5 F (36.9 C) (Oral)   Resp 16   LMP 03/05/2016 (Approximate)   SpO2 99%  Gen:   Awake, no distress   Resp:  Normal effort  MSK:   Moves extremities without difficulty other than right knee, significantly limited ROM 2/2 pain, very TTP medial aspect of patella, patellar ligament -- difficult to assess for effusion as patient is wearing leggings Other:  Intact dp, pt pulses, cap refill distal to injury  Medical Decision Making  Medically screening exam initiated at 10:41 PM.  Appropriate orders placed.  Alinda DAANYA LANPHIER was informed that the remainder of the evaluation will be completed by another provider, this initial triage assessment does not replace that evaluation, and the importance of remaining in the ED until their evaluation is complete.  Right knee injury, known partial replacement   [place] 09/28/21 2243    Alvira Monday, MD 09/29/21 2155

## 2021-09-29 ENCOUNTER — Encounter (HOSPITAL_COMMUNITY): Payer: Self-pay | Admitting: Student

## 2021-09-29 MED ORDER — NAPROXEN 375 MG PO TABS: 375.0000 mg | ORAL_TABLET | Freq: Two times a day (BID) | ORAL | 0 refills | Status: DC | PRN

## 2021-09-29 MED ORDER — KETOROLAC TROMETHAMINE 15 MG/ML IJ SOLN
15.0000 mg | Freq: Once | INTRAMUSCULAR | Status: AC
  Administered 2021-09-29: 15 mg via INTRAVENOUS
  Filled 2021-09-29: qty 1

## 2021-09-29 MED ORDER — LORAZEPAM 2 MG/ML IJ SOLN
0.5000 mg | Freq: Once | INTRAMUSCULAR | Status: AC
  Administered 2021-09-29: 0.5 mg via INTRAVENOUS
  Filled 2021-09-29: qty 1

## 2021-09-29 NOTE — Discharge Instructions (Addendum)
Please read and follow all provided instructions.  You have been seen today for right knee pain.   Tests performed today include: An x-ray of the affected area - does NOT show any broken bones or dislocations.  Vital signs. See below for your results today.   Home care instructions: -- *PRICE in the first 24-48 hours after injury: Protect (with brace, splint, sling), if given by your provider Rest Ice- Do not apply ice pack directly to your skin, place towel or similar between your skin and ice/ice pack. Apply ice for 20 min, then remove for 40 min while awake Compression- Wear brace, elastic bandage, splint as directed by your provider Elevate affected extremity above the level of your heart when not walking around for the first 24-48 hours   Medications: - Naproxen is a nonsteroidal anti-inflammatory medication that will help with pain and swelling. Be sure to take this medication as prescribed with food, 1 pill every 12 hours,  It should be taken with food, as it can cause stomach upset, and more seriously, stomach bleeding. Do not take other nonsteroidal anti-inflammatory medications with this such as Advil, Motrin, Aleve, Mobic, Goodie Powder, or Motrin.   You make take Tylenol per over the counter dosing with these medications.   We have prescribed you new medication(s) today. Discuss the medications prescribed today with your pharmacist as they can have adverse effects and interactions with your other medicines including over the counter and prescribed medications. Seek medical evaluation if you start to experience new or abnormal symptoms after taking one of these medicines, seek care immediately if you start to experience difficulty breathing, feeling of your throat closing, facial swelling, or rash as these could be indications of a more serious allergic reaction   Follow-up instructions: Please follow-up with your orthopedic surgeon.  Return instructions:  Please return if your  digits or extremity are numb or tingling, appear gray or blue, or you have severe pain (also elevate the extremity and loosen splint or wrap if you were given one) Please return if you have redness or fevers.  Please return to the Emergency Department if you experience worsening symptoms.  Please return if you have any other emergent concerns. Additional Information:  Your vital signs today were: BP 132/65   Pulse 65   Temp 98.5 F (36.9 C) (Oral)   Resp 16   LMP 03/05/2016 (Approximate)   SpO2 100%  If your blood pressure (BP) was elevated above 135/85 this visit, please have this repeated by your doctor within one month. ---------------

## 2021-09-29 NOTE — Progress Notes (Signed)
Orthopedic Tech Progress Note Patient Details:  Shelly [name] July 17, 1971 [id number]  Ortho Devices Type of Ortho Device: Knee Immobilizer Ortho Device/Splint Location: rle Ortho Device/Splint Interventions: Ordered, Application, Adjustment   Post Interventions Patient Tolerated: Well Instructions Provided: Care of device, Adjustment of device  Trinna Post 09/29/2021, 4:45 AM

## 2021-09-29 NOTE — ED Provider Notes (Signed)
[facility] EMERGENCY DEPARTMENT Provider Note   CSN: [id number] Arrival date & time: 09/28/21  2208     History Chief Complaint  Patient presents with   Knee Pain    Shelly [name] is a 50 y.o. female with a history of right partial arthroplasty with total revision who presents to the emergency department with complaints of right knee pain that began shortly PTA. Patient reports that she was on the couch and when she re-positioned she felt/heard a pop in her right knee with immediate onset of pain. Unable to move the knee significantly- this aggravates the pain. Called EMS who gave fentanyl en route. She is very anxious. She denies any other areas of pain. Denies fever, chills, numbness, or weakness.   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   Knee injury 03/19/2016   S/P right UKR 09/02/2015   Status post unilateral knee replacement 09/02/2015   S/P right knee arthroscopy 06/11/2015   Endometrial polyp 10/11/2013   ALLERGIC RHINITIS 11/14/2009   WRIST PAIN, LEFT 10/13/2007   ASTHMA 08/20/2007   GERD 08/20/2007    Past Surgical History:  Procedure Laterality Date   CESAREAN SECTION  06-03-2011//  09-27-2003//  2000   Bilateral Tubal Ligation w/ last c/s   CHONDROPLASTY Right 06/11/2015   Procedure: CHONDROPLASTY;  Surgeon: Eugenia Mcalpine, MD;  Location: [facility];  Service: Orthopedics;  Laterality: Right;   FOREIGN BODY REMOVAL Right 06/11/2015   Procedure: RIGHT REMOVAL LOOSE BODY;  Surgeon: Eugenia Mcalpine, MD;  Location:  [facility];  Service: Orthopedics;  Laterality: Right;   KNEE ARTHROSCOPY Right 1990   KNEE ARTHROSCOPY Right 06/11/2015   Procedure: RIGHT ARTHROSCOPY KNEE;  Surgeon: Eugenia Mcalpine, MD;  Location: [facility];  Service: Orthopedics;  Laterality: Right;   LEFT WRIST REDUCTION AND FUSION   07-19-2008   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   PARTIAL KNEE ARTHROPLASTY Right 09/02/2015   Procedure: RIGHT UNI KNEE ARTHROPLASTY MEDIALLY;  Surgeon: Durene Romans, MD;  Location: [facility];  Service: Orthopedics;  Laterality: Right;   TOTAL KNEE REVISION Right 03/20/2016   Procedure: RIGHT UNI KNEE POLY EXCHANGE;  Surgeon: Durene Romans, MD;  Location: [facility];  Service: Orthopedics;  Laterality: Right;   WISDOM TOOTH EXTRACTION Bilateral      OB History     Gravida  5   Para  3   Term  3   Preterm  0   AB  2   Living  3      SAB  1   IAB  1   Ectopic      Multiple      Live Births              Family History  Problem Relation Age of Onset   COPD Father    Emphysema Father    Arthritis Mother    Alcohol abuse Paternal Grandmother    Cancer Paternal Grandmother     Social History  Tobacco Use   Smoking status: Former    Years: 6.00    Types: Cigarettes    Quit date: 06/10/1995    Years since quitting: 26.3   Smokeless tobacco: Never  Substance Use Topics   Alcohol use: Yes    Alcohol/week: 0.0 standard drinks    Comment: occasional   Drug use: No    Home Medications Prior to Admission medications   Medication Sig Start Date End Date Taking? Authorizing Provider  albuterol (PROVENTIL HFA) 108 (90 BASE) MCG/ACT inhaler Inhale 1 puff into the lungs daily as needed. Patient taking differently: Inhale 1 puff into the lungs every 4 (four) hours as needed for wheezing or shortness of breath. 06/06/14   Gordy Savers, MD  budesonide-formoterol [facility]) 160-4.5 MCG/ACT inhaler Inhale 2 puffs into the lungs 2 (two) times daily.     [provider]  calcium carbonate (TUMS - DOSED IN MG ELEMENTAL CALCIUM) 500 MG chewable tablet Chew 1 tablet by mouth as needed for indigestion or heartburn.    [provider]  FLUoxetine (PROZAC) 20 MG capsule TAKE 1 CAPSULE BY MOUTH EVERY DAY 06/10/21   [place], Betty G, MD  montelukast (SINGULAIR) 10 MG tablet TAKE 1 TABLET BY MOUTH EVERY NIGHT AT BEDTIME 10/12/14   Gordy Savers, MD    Allergies    Patient has no known allergies.  Review of Systems   Review of Systems  Constitutional:  Negative for chills and fever.  Respiratory:  Negative for shortness of breath.   Cardiovascular:  Negative for chest pain.  Gastrointestinal:  Negative for abdominal pain and vomiting.  Musculoskeletal:  Positive for arthralgias.  Skin:  Negative for color change and wound.  Neurological:  Negative for weakness and numbness.  All other systems reviewed and are negative.  Physical Exam Updated Vital Signs BP 132/65   Pulse 65   Temp 98.5 F (36.9 C) (Oral)   Resp 16   LMP 03/05/2016 (Approximate)   SpO2 100%   Physical Exam Vitals and nursing note reviewed.  Constitutional:      General: She is not in acute distress.    Appearance: She is not ill-appearing or toxic-appearing.  HENT:     Head: Normocephalic and atraumatic.  Cardiovascular:     Rate and Rhythm: Normal rate.     Pulses:          Dorsalis pedis pulses are 2+ on the right side and 2+ on the left side.       Posterior tibial pulses are 2+ on the right side and 2+ on the left side.  Pulmonary:     Effort: Pulmonary effort is normal.  Musculoskeletal:     Comments: Lower extremities: surgical scar to anterior right knee. No obvious deformity, appreciable swelling, edema, erythema, ecchymosis, warmth, or open wounds. Patient has intact AROM throughout with the exception of the right knee.  Patient is holding her right knee in a degree of flexion, she is able to flex/extend somewhat and is able to do so  against gravity and some resistance.  She is able to lift her leg off of the bed without difficulty.  Tender to palpation diffuse anterior right knee.  No posterior tenderness.  Otherwise nontender..   Skin:    General: Skin is warm and dry.     Capillary Refill: Capillary refill takes less than 2 seconds.  Neurological:     Mental Status: She is alert.     Comments: Alert. Clear speech. Sensation grossly  intact to bilateral lower extremities. 5/5 strength with plantar/dorsiflexion bilaterally.   Psychiatric:        Mood and Affect: Mood normal.        Behavior: Behavior normal.    ED Results / Procedures / Treatments   Labs (all labs ordered are listed, but only abnormal results are displayed) Labs Reviewed - No data to display  EKG None  Radiology DG Knee Complete 4 Views Right  Result Date: 09/28/2021 CLINICAL DATA:  Twisting injury with right knee pain, initial encounter EXAM: RIGHT KNEE - COMPLETE 4+ VIEW COMPARISON:  03/19/2016 FINDINGS: Partial medial joint replacement is noted. Patellofemoral degenerative changes are seen. Mild lateral joint space degenerative changes noted as well. No acute fracture or effusion is seen. IMPRESSION: Postsurgical and degenerative changes without acute abnormality. Electronically Signed   By: Alcide Clever M.D.   On: 09/28/2021 23:42    Procedures Procedures   Medications Ordered in ED Medications  oxyCODONE-acetaminophen (PERCOCET/ROXICET) 5-325 MG per tablet 1 tablet (1 tablet Oral Given 09/28/21 2245)    ED Course  I have reviewed the triage vital signs and the nursing notes.  Pertinent labs & imaging results that were available during my care of the patient were reviewed by me and considered in my medical decision making (see chart for details).    MDM Rules/Calculators/A&P                           Patient presents to the ED with complaints of right knee pain.   Ativan ordered for anxiety as she is very apprehensive to move her  knee and toradol ordered for pain.   Additional history obtained:  Additional history obtained from chart review & nursing note review.   Imaging Studies ordered:  Right knee xray ordered in triage- I independently reviewed, formal radiology impression shows:  Postsurgical and degenerative changes without acute abnormality  ED Course:  X-ray without fracture or dislocation, hardware in place. No open wounds, erythema, or warmth to suggest infectious process. Patient is neurovascularly intact distally. On re-assessment she is feeling much better- placed in knee immobilizer. Recommended price, will provide naproxen to take for pain and swelling with close follow-up with her orthopedist.  I discussed results, treatment plan, need for follow-up, and return precautions with the patient. Provided opportunity for questions, patient confirmed understanding and is in agreement with plan.   Portions of this note were generated with Scientist, clinical (histocompatibility and immunogenetics). Dictation errors may occur despite best attempts at proofreading.  Final Clinical Impression(s) / ED Diagnoses Final diagnoses:  Acute pain of right knee    Rx / DC Orders ED Discharge Orders          Ordered    naproxen (NAPROSYN) 375 MG tablet  2 times daily PRN        09/29/21 0514             Cherly Lucero, PA-C 09/29/21 9892    Gilda Crease, MD 09/29/21 [phone]

## 2021-10-03 ENCOUNTER — Encounter: Payer: Self-pay | Admitting: Family Medicine

## 2021-10-03 ENCOUNTER — Telehealth: Payer: No Typology Code available for payment source | Admitting: Adult Health

## 2021-10-03 ENCOUNTER — Telehealth (INDEPENDENT_AMBULATORY_CARE_PROVIDER_SITE_OTHER): Payer: No Typology Code available for payment source | Admitting: Family Medicine

## 2021-10-03 VITALS — Ht 65.0 in

## 2021-10-03 DIAGNOSIS — M25661 Stiffness of right knee, not elsewhere classified: Secondary | ICD-10-CM

## 2021-10-03 NOTE — Progress Notes (Signed)
Virtual Visit via Video Note I connected with Shelly [name] on 10/03/21 by a video enabled telemedicine application and verified that I am speaking with the correct person using two identifiers.  Location patient: home Location provider:work office Persons participating in the virtual visit: patient, provider  I discussed the limitations of evaluation and management by telemedicine and the availability of in person appointments. The patient expressed understanding and agreed to proceed.  Chief Complaint  Patient presents with   Follow-up    Went to ED for knee pain on 11/6. 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    HPI: Shelly [name] is a 50 yo with history of anxiety, allergies, depression, and OA requesting a note excuse for work to cover last week, when she was off from work due to right knee pain. Evaluated in the ED on 09/28/21.  She has hx of knee OA s/p TKR in 2016 and revision in 2016. States that she was on the couch when she heard a "loud pop" in her right knee, sudden onset of pain and limitation of ROM. EMS was called and transported to the ED.  Right knee X [id number] and degenerative changes without acute abnormality.  Pain has improved, she still has limitation of extension, using crutches. Negative for fever,chills, joint erythema, abdominal pain,N/V, numbness,or weakness.  Her job entails walking most of the day but she can perform other functions that are more sedentarily , she will need a note for job accommodations.  She has appointment with orthopedics on 11/05/2021, she is on the cancellation list.  ROS: See pertinent positives and negatives per HPI.  Past Medical History:  Diagnosis Date   Anxiety    Asthma    Depression    GERD (gastroesophageal reflux disease)    Intermittent palpitations    Loose body of right knee    Osteochondral defect    right knee    Past Surgical History:  Procedure Laterality Date   CESAREAN SECTION   06-03-2011//  09-27-2003//  2000   Bilateral Tubal Ligation w/ last c/s   CHONDROPLASTY Right 06/11/2015   Procedure: CHONDROPLASTY;  Surgeon: Eugenia Mcalpine, MD;  Location: [facility];  Service: Orthopedics;  Laterality: Right;   FOREIGN BODY REMOVAL Right 06/11/2015   Procedure: RIGHT REMOVAL LOOSE BODY;  Surgeon: Eugenia Mcalpine, MD;  Location: [facility];  Service: Orthopedics;  Laterality: Right;   KNEE ARTHROSCOPY Right 1990   KNEE ARTHROSCOPY Right 06/11/2015   Procedure: RIGHT ARTHROSCOPY KNEE;  Surgeon: Eugenia Mcalpine, MD;  Location: [facility];  Service: Orthopedics;  Laterality: Right;   LEFT WRIST REDUCTION AND FUSION   07-19-2008   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   PARTIAL KNEE ARTHROPLASTY Right 09/02/2015   Procedure: RIGHT UNI KNEE ARTHROPLASTY MEDIALLY;  Surgeon: Durene Romans, MD;  Location: [facility];  Service: Orthopedics;  Laterality: Right;   TOTAL KNEE REVISION Right 03/20/2016   Procedure: RIGHT UNI KNEE POLY EXCHANGE;  Surgeon: Durene Romans, MD;  Location: [facility];  Service: Orthopedics;  Laterality: Right;   WISDOM TOOTH EXTRACTION Bilateral     Family History  Problem Relation Age of Onset   COPD Father    Emphysema Father    Arthritis Mother    Alcohol abuse Paternal Grandmother    Cancer Paternal Grandmother     Social History   Socioeconomic History   Marital status: Married    Spouse name: Not on file   Number of children: Not on file  Years of education: Not on file   Highest education level: Not on file  Occupational History   Not on file  Tobacco Use   Smoking status: Former    Years: 6.00    Types: Cigarettes    Quit date: 06/10/1995    Years since quitting: 26.3   Smokeless tobacco: Never  Substance and Sexual Activity   Alcohol use: Yes    Alcohol/week: 0.0 standard drinks    Comment: occasional   Drug use: No   Sexual activity: Yes    Partners: Male    Birth control/protection: Surgical  Other  Topics Concern   Not on file  Social History Narrative   Not on file   Social Determinants of Health   Financial Resource Strain: Not on file  Food Insecurity: Not on file  Transportation Needs: Not on file  Physical Activity: Not on file  Stress: Not on file  Social Connections: Not on file  Intimate Partner Violence: Not on file    Current Outpatient Medications:    albuterol (PROVENTIL HFA) 108 (90 BASE) MCG/ACT inhaler, Inhale 1 puff into the lungs daily as needed. (Patient taking differently: Inhale 1 puff into the lungs every 4 (four) hours as needed for wheezing or shortness of breath.), Disp: 1 Inhaler, Rfl: 6   budesonide-formoterol (SYMBICORT) 160-4.5 MCG/ACT inhaler, Inhale 2 puffs into the lungs 2 (two) times daily., Disp: , Rfl:    calcium carbonate (TUMS - DOSED IN MG ELEMENTAL CALCIUM) 500 MG chewable tablet, Chew 1 tablet by mouth as needed for indigestion or heartburn., Disp: , Rfl:    FLUoxetine (PROZAC) 20 MG capsule, TAKE 1 CAPSULE BY MOUTH EVERY DAY, Disp: 90 capsule, Rfl: 3   montelukast (SINGULAIR) 10 MG tablet, TAKE 1 TABLET BY MOUTH EVERY NIGHT AT BEDTIME, Disp: 90 tablet, Rfl: 1   naproxen (NAPROSYN) 375 MG tablet, Take 1 tablet (375 mg total) by mouth 2 (two) times daily as needed., Disp: 10 tablet, Rfl: 0  EXAM:  VITALS per patient if applicable:Ht 5\' 5"  (1.651 m)   LMP 03/05/2016 (Approximate)   BMI 34.78 kg/m   GENERAL: alert, oriented, appears well and in no acute distress  HEENT: atraumatic, conjunctiva clear, no obvious abnormalities on inspection.  NECK: normal movements of the head and neck  LUNGS: on inspection no signs of respiratory distress, breathing rate appears normal, no obvious gross SOB, gasping or wheezing  CV: no obvious cyanosis  PSYCH/NEURO: pleasant and cooperative, no obvious depression or anxiety, speech and thought processing grossly intact  ASSESSMENT AND PLAN:  Discussed the following assessment and  plan:  Limitation of joint motion of right knee Pain has greatly improved. S/P TKR. Continue ambulation as tolerated. Ortho appt has been arranged. Excuse letter for work will be provided as well recommendations for light duty x 2 months.  We discussed possible serious and likely etiologies, options for evaluation and workup, limitations of telemedicine visit vs in person visit, treatment, treatment risks and precautions.  I discussed the assessment and treatment plan with the patient. The patient was provided an opportunity to ask questions and all were answered. The patient agreed with the plan and demonstrated an understanding of the instructions.  Return if symptoms worsen or fail to improve. Kirt Chew G. 03/07/2016, MD  [facility]. [place] office.

## 2021-10-04 ENCOUNTER — Encounter: Payer: Self-pay | Admitting: Family Medicine

## 2021-11-07 ENCOUNTER — Encounter (HOSPITAL_COMMUNITY): Payer: Self-pay | Admitting: Orthopedic Surgery

## 2021-11-07 NOTE — Progress Notes (Signed)
For Short Stay: COVID SWAB appointment date: Date of COVID positive in last 90 days:   For Anesthesia: PCP - Betty G [place], MD last office visit note 10/03/2021 in epic Cardiologist -   Chest x-ray - greater than 1 year in epic EKG -  Stress Test -  ECHO -  Cardiac Cath -  Pacemaker/ICD device last checked: Pacemaker orders received: Device Rep notified:  Sleep Study -  CPAP -   Fasting Blood Sugar -  Checks Blood Sugar _____ times a day  Blood Thinner Instructions: Aspirin Instructions: Last Dose:  Activity level: Can go up a flight of stairs and activities of daily living without stopping and without chest pain and/or shortness of breath   Able to exercise without chest pain and/or shortness of breath   Unable to go up a flight of stairs without chest pain and/or shortness of breath     Anesthesia review:   Patient denies shortness of breath, fever, cough and chest pain at PAT appointment   Patient verbalized understanding of instructions that were given to them at the PAT appointment. Patient was also instructed that they will need to review over the PAT instructions again at home before surgery.

## 2021-11-10 ENCOUNTER — Other Ambulatory Visit: Payer: Self-pay

## 2021-11-10 ENCOUNTER — Encounter (HOSPITAL_COMMUNITY): Payer: Self-pay

## 2021-11-10 NOTE — Progress Notes (Signed)
For Short Stay: COVID SWAB appointment date: N/A Date of COVID positive in last 5 days:N/A   For Anesthesia: PCP - Betty G [place], MD last office visit note 10/03/2021 in epic Cardiologist - N/A  Chest x-ray - greater than 1 year in epic EKG - N/A Stress Test - greater than 2 years ECHO - N/A Cardiac Cath - N/A Pacemaker/ICD device last checked:N/A Pacemaker orders received:N/A Device Rep notified:N/A  Sleep Study - N/A CPAP - N/A  Fasting Blood Sugar - N/A Checks Blood Sugar __N/A___ times a day  Blood Thinner Instructions:  N/A Aspirin Instructions:N/A Last Dose:N/A  Activity level:   Able to exercise without chest pain and/or shortness of breath       Anesthesia review: N/A  Patient denies shortness of breath, fever, cough and chest pain at PAT appointment   Patient verbalized understanding of instructions that were given to them at the PAT appointment. Patient was also instructed that they will need to review over the PAT instructions again at home before surgery.

## 2021-11-10 NOTE — H&P (Signed)
PARTIAL KNEE REVISION ADMISSION H&P  Patient is being admitted for right revision partial knee arthroplasty.  Subjective:  Chief Complaint:right knee pain with dislocated polyethylene  HPI: Shelly Shelly [name], 50 y.o. female, has a remote history of right knee unicompartmental arthroplasty. She had a polyethylene dislocation in 2017 and underwent poly exchange by Dr. Charlann Boxer at that time. She has done well with the knee overall. She reports she was sitting with Shelly [name] knee hyperflexed when she felt a pop. She was seen at [facility] ED on 09/28/21 and told that there were no issues with the implant based on imaging.She was seen in our office for follow up on 11/05/21. Imaging from the ED was reviewed which shows polyethylene dislocation. Dr. Charlann Boxer discussed treatment options, including polyethylene revision, conversion to fixed bearing partial knee, and conversion to total knee arthroplasty. She elects to proceed with polyethylene exchange.    There is no current active infection.  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   Knee injury 03/19/2016   S/P right UKR 09/02/2015   Status post unilateral knee replacement 09/02/2015   S/P right knee arthroscopy 06/11/2015   Endometrial polyp 10/11/2013   ALLERGIC RHINITIS 11/14/2009   WRIST PAIN, LEFT 10/13/2007   ASTHMA 08/20/2007   GERD 08/20/2007   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 08/2021   Carpal tunnel syndrome    Right   Depression    GERD (gastroesophageal reflux disease)    Intermittent palpitations    Loose body of right knee    Migraines    Osteochondral defect    right knee    Past Surgical History:  Procedure  Laterality Date   CESAREAN SECTION  06-03-2011//  09-27-2003//  2000   Bilateral Tubal Ligation w/ last c/s   CHONDROPLASTY Right 06/11/2015   Procedure: CHONDROPLASTY;  Surgeon: Eugenia Mcalpine, MD;  Location: [facility];  Service: Orthopedics;  Laterality: Right;   DILATION AND CURETTAGE OF UTERUS     FOREIGN BODY REMOVAL Right 06/11/2015   Procedure: RIGHT REMOVAL LOOSE BODY;  Surgeon: Eugenia Mcalpine, MD;  Location: [facility];  Service: Orthopedics;  Laterality: Right;   KNEE ARTHROSCOPY Right 11/23/1988   KNEE ARTHROSCOPY Right 06/11/2015   Procedure: RIGHT ARTHROSCOPY KNEE;  Surgeon: Eugenia Mcalpine, MD;  Location: [facility];  Service: Orthopedics;  Laterality: Right;   LEFT WRIST REDUCTION AND FUSION   07/19/2008   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   PARTIAL KNEE ARTHROPLASTY Right 09/02/2015   Procedure: RIGHT UNI KNEE ARTHROPLASTY MEDIALLY;  Surgeon: Durene Romans, MD;  Location: [facility];  Service: Orthopedics;  Laterality: Right;   TOTAL KNEE REVISION Right 03/20/2016   Procedure: RIGHT UNI KNEE POLY EXCHANGE;  Surgeon: Durene Romans, MD;  Location: [facility];  Service: Orthopedics;  Laterality: Right;   TUBAL LIGATION     WISDOM TOOTH EXTRACTION Bilateral     No current facility-administered medications for this encounter.   Current Outpatient Medications  Medication Sig Dispense Refill Last Dose   acetaminophen (TYLENOL) 500 MG tablet Take 1,000 mg by mouth every 6 (six) hours as needed for moderate pain.  cetirizine (ZYRTEC) 10 MG tablet Take 10 mg by mouth daily.      FLUoxetine (PROZAC) 20 MG capsule TAKE 1 CAPSULE BY MOUTH EVERY DAY 90 capsule 3    Melatonin 10 MG CAPS Take 20 mg by mouth at bedtime as needed (sleep).      naproxen (NAPROSYN) 375 MG tablet Take 1 tablet (375 mg total) by mouth 2 (two) times daily as needed. 10 tablet 0    omeprazole (PRILOSEC) 20 MG capsule Take 20 mg by mouth daily.      OVER THE COUNTER  MEDICATION Take 1 capsule by mouth at bedtime as needed (sleep). CBD      No Known Allergies  Social History   Tobacco Use   Smoking status: Former    Years: 6.00    Types: Cigarettes    Quit date: 06/10/1995    Years since quitting: 26.4   Smokeless tobacco: Never  Substance Use Topics   Alcohol use: Yes    Alcohol/week: 0.0 standard drinks    Comment: occasional    Family History  Problem Relation Age of Onset   COPD Father    Emphysema Father    Arthritis Mother    Alcohol abuse Paternal Grandmother    Cancer Paternal Grandmother       Review of Systems  Constitutional:  Negative for chills and fever.  Respiratory:  Negative for cough and shortness of breath.   Cardiovascular:  Negative for chest pain.  Gastrointestinal:  Negative for nausea and vomiting.  Musculoskeletal:  Positive for arthralgias.     Objective:  Physical Exam Right knee exam: She does lack full extension actively due to mechanical blocking as well as flexion limited to about 100 degrees with blocking she does have tenderness over the anterior medial aspect of the knee Shelly [name] skin incision is well-healed without signs of infection No significant effusion today No lower extremity edema  Vital signs in last 24 hours: Weight:  [95.3 kg] 95.3 kg (12/19 1156)  Labs:  Estimated body mass index is 34.95 kg/m as calculated from the following:   Height as of this encounter: 5\' 5"  (1.651 m).   Weight as of this encounter: 95.3 kg.  Imaging Review  Standing AP and lateral of the right knee were ordered and reviewed. These radiographs reveal and demonstrate a dislocated polyethylene anteriorly. This was reviewed with Shelly [name] and compared to prior films where the polyethylene was located.    Assessment/Plan:  Dislocated polyethylene, right knee(s) with failed previous arthroplasty.   The patient history, physical examination, clinical judgment of the provider and imaging studies are consistent with  dislocated polyethylene of the right knee(s), previous partial knee arthroplasty. Revision partial knee arthroplasty is deemed medically necessary. The treatment options including medical management, injection therapy, arthroscopy and revision arthroplasty were discussed at length. The risks and benefits of revision total knee arthroplasty were presented and reviewed. The risks due to recurrent polyethylene dislocation, aseptic loosening, infection, stiffness, thromboembolic complications and other imponderables were discussed. The patient acknowledged the explanation, agreed to proceed with the plan and consent was signed. Patient is being admitted for inpatient treatment for surgery, pain control, PT, OT, prophylactic antibiotics, VTE prophylaxis, progressive ambulation and ADL's and discharge planning.The patient is planning to be discharge home.  , PA-C Orthopedic Surgery [facility] [place] [phone]

## 2021-11-10 NOTE — Anesthesia Preprocedure Evaluation (Addendum)
Anesthesia Evaluation  Patient identified by MRN, date of birth, ID band Patient awake    Reviewed: Allergy & Precautions, NPO status , Patient's Chart, lab work & pertinent test results  History of Anesthesia Complications Negative for: history of anesthetic complications  Airway Mallampati: II  TM Distance: >3 FB Neck ROM: Full    Dental   Pulmonary asthma , former smoker,    Pulmonary exam normal        Cardiovascular negative cardio ROS Normal cardiovascular exam     Neuro/Psych  Headaches, Anxiety Depression    GI/Hepatic Neg liver ROS, GERD  ,  Endo/Other  negative endocrine ROS  Renal/GU negative Renal ROS  negative genitourinary   Musculoskeletal  (+) Arthritis ,   Abdominal   Peds  Hematology  (+) anemia ,   Anesthesia Other Findings  Hgb 11.9, Plts 290  Reproductive/Obstetrics                           Anesthesia Physical Anesthesia Plan  ASA: 2  Anesthesia Plan: Spinal   Post-op Pain Management: Toradol IV (intra-op), Ketamine IV and Tylenol PO (pre-op)   Induction:   PONV Risk Score and Plan: 2 and Propofol infusion, Treatment may vary due to age or medical condition, Ondansetron and TIVA  Airway Management Planned: Nasal Cannula and Simple Face Mask  Additional Equipment: None  Intra-op Plan:   Post-operative Plan:   Informed Consent: I have reviewed the patients History and Physical, chart, labs and discussed the procedure including the risks, benefits and alternatives for the proposed anesthesia with the patient or authorized representative who has indicated his/her understanding and acceptance.       Plan Discussed with:   Anesthesia Plan Comments:      Anesthesia Quick Evaluation

## 2021-11-11 ENCOUNTER — Encounter (HOSPITAL_COMMUNITY): Payer: Self-pay | Admitting: Orthopedic Surgery

## 2021-11-11 ENCOUNTER — Encounter (HOSPITAL_COMMUNITY)
Admission: RE | Disposition: A | Discharge status: Home / Self Care | Payer: Self-pay | Source: Ambulatory Visit | Attending: Orthopedic Surgery

## 2021-11-11 ENCOUNTER — Ambulatory Visit (HOSPITAL_COMMUNITY): Payer: No Typology Code available for payment source | Admitting: Certified Registered"

## 2021-11-11 ENCOUNTER — Other Ambulatory Visit: Payer: Self-pay

## 2021-11-11 ENCOUNTER — Ambulatory Visit (HOSPITAL_COMMUNITY)
Admission: RE | Admit: 2021-11-11 | Discharge: 2021-11-11 | Disposition: A | Discharge status: Home / Self Care | Payer: No Typology Code available for payment source | Source: Ambulatory Visit | Attending: Orthopedic Surgery | Admitting: Orthopedic Surgery

## 2021-11-11 DIAGNOSIS — D649 Anemia, unspecified: Secondary | ICD-10-CM | POA: Insufficient documentation

## 2021-11-11 DIAGNOSIS — Z87891 Personal history of nicotine dependence: Secondary | ICD-10-CM | POA: Insufficient documentation

## 2021-11-11 DIAGNOSIS — T84018S Broken internal joint prosthesis, other site, sequela: Secondary | ICD-10-CM

## 2021-11-11 DIAGNOSIS — F32A Depression, unspecified: Secondary | ICD-10-CM | POA: Diagnosis not present

## 2021-11-11 DIAGNOSIS — F419 Anxiety disorder, unspecified: Secondary | ICD-10-CM | POA: Insufficient documentation

## 2021-11-11 DIAGNOSIS — K219 Gastro-esophageal reflux disease without esophagitis: Secondary | ICD-10-CM | POA: Diagnosis not present

## 2021-11-11 DIAGNOSIS — T84022A Instability of internal right knee prosthesis, initial encounter: Secondary | ICD-10-CM | POA: Insufficient documentation

## 2021-11-11 DIAGNOSIS — Z01818 Encounter for other preprocedural examination: Secondary | ICD-10-CM

## 2021-11-11 DIAGNOSIS — M1990 Unspecified osteoarthritis, unspecified site: Secondary | ICD-10-CM | POA: Diagnosis not present

## 2021-11-11 DIAGNOSIS — J45909 Unspecified asthma, uncomplicated: Secondary | ICD-10-CM | POA: Insufficient documentation

## 2021-11-11 DIAGNOSIS — Z96651 Presence of right artificial knee joint: Secondary | ICD-10-CM | POA: Diagnosis not present

## 2021-11-11 DIAGNOSIS — Y792 Prosthetic and other implants, materials and accessory orthopedic devices associated with adverse incidents: Secondary | ICD-10-CM | POA: Diagnosis not present

## 2021-11-11 HISTORY — DX: Unspecified osteoarthritis, unspecified site: M19.90

## 2021-11-11 HISTORY — DX: Carpal tunnel syndrome, unspecified upper limb: G56.00

## 2021-11-11 HISTORY — DX: Migraine, unspecified, not intractable, without status migrainosus: G43.909

## 2021-11-11 HISTORY — DX: Anemia, unspecified: D64.9

## 2021-11-11 HISTORY — PX: TOTAL KNEE REVISION WITH SCAR DEBRIDEMENT/PATELLA REVISION WITH POLY EXCHANGE: SHX6128

## 2021-11-11 LAB — CBC
HCT: 37.5 % (ref 36.0–46.0)
Hemoglobin: 11.9 g/dL — ABNORMAL LOW (ref 12.0–15.0)
MCH: 28 pg (ref 26.0–34.0)
MCHC: 31.7 g/dL (ref 30.0–36.0)
MCV: 88.2 fL (ref 80.0–100.0)
Platelets: 290 10*3/uL (ref 150–400)
RBC: 4.25 MIL/uL (ref 3.87–5.11)
RDW: 15.1 % (ref 11.5–15.5)
WBC: 6.3 10*3/uL (ref 4.0–10.5)
nRBC: 0 % (ref 0.0–0.2)

## 2021-11-11 LAB — TYPE AND SCREEN
ABO/RH(D): A POS
Antibody Screen: NEGATIVE

## 2021-11-11 LAB — SURGICAL PCR SCREEN
MRSA, PCR: NEGATIVE
Staphylococcus aureus: NEGATIVE

## 2021-11-11 SURGERY — TOTAL KNEE REVISION WITH SCAR DEBRIDEMENT/PATELLA REVISION WITH POLY EXCHANGE
Anesthesia: Spinal | Site: Knee | Laterality: Right

## 2021-11-11 MED ORDER — CEFAZOLIN SODIUM-DEXTROSE 2-4 GM/100ML-% IV SOLN
INTRAVENOUS | Status: AC
  Filled 2021-11-11: qty 100

## 2021-11-11 MED ORDER — FENTANYL CITRATE PF 50 MCG/ML IJ SOSY: 25.0000 ug | PREFILLED_SYRINGE | INTRAMUSCULAR | Status: DC | PRN

## 2021-11-11 MED ORDER — HYDROCODONE-ACETAMINOPHEN 5-325 MG PO TABS
1.0000 | ORAL_TABLET | Freq: Four times a day (QID) | ORAL | 0 refills | Status: DC | PRN
Start: 2021-11-11 — End: 2022-07-22

## 2021-11-11 MED ORDER — ASPIRIN 81 MG PO CHEW: 81.0000 mg | CHEWABLE_TABLET | Freq: Two times a day (BID) | ORAL | 0 refills | Status: AC

## 2021-11-11 MED ORDER — LACTATED RINGERS IV BOLUS
250.0000 mL | Freq: Once | INTRAVENOUS | Status: AC
  Administered 2021-11-11: 10:00:00 250 mL via INTRAVENOUS

## 2021-11-11 MED ORDER — TRANEXAMIC ACID-NACL 1000-0.7 MG/100ML-% IV SOLN
INTRAVENOUS | Status: AC
  Filled 2021-11-11: qty 100

## 2021-11-11 MED ORDER — OXYCODONE HCL 5 MG PO TABS
5.0000 mg | ORAL_TABLET | Freq: Once | ORAL | Status: AC | PRN
  Administered 2021-11-11: 14:00:00 5 mg via ORAL

## 2021-11-11 MED ORDER — ACETAMINOPHEN 500 MG PO TABS
1000.0000 mg | ORAL_TABLET | Freq: Once | ORAL | Status: AC
  Administered 2021-11-11: 06:00:00 1000 mg via ORAL
  Filled 2021-11-11: qty 2

## 2021-11-11 MED ORDER — PROPOFOL 10 MG/ML IV BOLUS
INTRAVENOUS | Status: DC | PRN
  Administered 2021-11-11: 50 mg via INTRAVENOUS

## 2021-11-11 MED ORDER — ORAL CARE MOUTH RINSE: 15.0000 mL | Freq: Once | OROMUCOSAL | Status: AC

## 2021-11-11 MED ORDER — CELECOXIB 200 MG PO CAPS: 200.0000 mg | ORAL_CAPSULE | Freq: Two times a day (BID) | ORAL | Status: DC

## 2021-11-11 MED ORDER — PROPOFOL 10 MG/ML IV BOLUS
INTRAVENOUS | Status: AC
  Filled 2021-11-11: qty 20

## 2021-11-11 MED ORDER — ONDANSETRON HCL 4 MG/2ML IJ SOLN: 4.0000 mg | Freq: Once | INTRAMUSCULAR | Status: DC | PRN

## 2021-11-11 MED ORDER — METHOCARBAMOL 500 MG PO TABS
ORAL_TABLET | ORAL | Status: AC
  Filled 2021-11-11: qty 1

## 2021-11-11 MED ORDER — BUPIVACAINE-EPINEPHRINE (PF) 0.25% -1:200000 IJ SOLN
INTRAMUSCULAR | Status: AC
  Filled 2021-11-11: qty 30

## 2021-11-11 MED ORDER — ACETAMINOPHEN 325 MG PO TABS: 325.0000 mg | ORAL_TABLET | Freq: Four times a day (QID) | ORAL | Status: DC | PRN

## 2021-11-11 MED ORDER — BUPIVACAINE-EPINEPHRINE 0.25% -1:200000 IJ SOLN
INTRAMUSCULAR | Status: DC | PRN
  Administered 2021-11-11: 30 mL

## 2021-11-11 MED ORDER — OXYCODONE HCL 5 MG/5ML PO SOLN: 5.0000 mg | Freq: Once | ORAL | Status: AC | PRN

## 2021-11-11 MED ORDER — OXYCODONE HCL 5 MG PO TABS
ORAL_TABLET | ORAL | Status: AC
  Filled 2021-11-11: qty 1

## 2021-11-11 MED ORDER — MORPHINE SULFATE (PF) 2 MG/ML IV SOLN: 0.5000 mg | INTRAVENOUS | Status: DC | PRN

## 2021-11-11 MED ORDER — METOCLOPRAMIDE HCL 5 MG PO TABS
5.0000 mg | ORAL_TABLET | Freq: Three times a day (TID) | ORAL | Status: DC | PRN
  Filled 2021-11-11: qty 2

## 2021-11-11 MED ORDER — LACTATED RINGERS IV SOLN: INTRAVENOUS | Status: DC

## 2021-11-11 MED ORDER — 0.9 % SODIUM CHLORIDE (POUR BTL) OPTIME
TOPICAL | Status: DC | PRN
  Administered 2021-11-11: 08:00:00 1000 mL

## 2021-11-11 MED ORDER — PROPOFOL 500 MG/50ML IV EMUL
INTRAVENOUS | Status: DC | PRN
  Administered 2021-11-11: 60 ug/kg/min via INTRAVENOUS

## 2021-11-11 MED ORDER — PROPOFOL 1000 MG/100ML IV EMUL
INTRAVENOUS | Status: AC
  Filled 2021-11-11: qty 100

## 2021-11-11 MED ORDER — DEXAMETHASONE SODIUM PHOSPHATE 10 MG/ML IJ SOLN
INTRAMUSCULAR | Status: AC
  Filled 2021-11-11: qty 1

## 2021-11-11 MED ORDER — KETOROLAC TROMETHAMINE 30 MG/ML IJ SOLN
INTRAMUSCULAR | Status: AC
  Filled 2021-11-11: qty 1

## 2021-11-11 MED ORDER — AMISULPRIDE (ANTIEMETIC) 5 MG/2ML IV SOLN: 10.0000 mg | Freq: Once | INTRAVENOUS | Status: DC | PRN

## 2021-11-11 MED ORDER — SODIUM CHLORIDE (PF) 0.9 % IJ SOLN
INTRAMUSCULAR | Status: AC
  Filled 2021-11-11: qty 30

## 2021-11-11 MED ORDER — CEFAZOLIN SODIUM-DEXTROSE 2-4 GM/100ML-% IV SOLN: 2.0000 g | Freq: Four times a day (QID) | INTRAVENOUS | Status: DC

## 2021-11-11 MED ORDER — ONDANSETRON HCL 4 MG/2ML IJ SOLN
INTRAMUSCULAR | Status: DC | PRN
  Administered 2021-11-11: 4 mg via INTRAVENOUS

## 2021-11-11 MED ORDER — CEFAZOLIN SODIUM-DEXTROSE 2-4 GM/100ML-% IV SOLN
INTRAVENOUS | Status: AC
  Administered 2021-11-11: 14:00:00 2 g via INTRAVENOUS
  Filled 2021-11-11: qty 100

## 2021-11-11 MED ORDER — CEFAZOLIN SODIUM-DEXTROSE 2-3 GM-%(50ML) IV SOLR
INTRAVENOUS | Status: DC | PRN
  Administered 2021-11-11: 2 g via INTRAVENOUS

## 2021-11-11 MED ORDER — ONDANSETRON HCL 4 MG PO TABS
4.0000 mg | ORAL_TABLET | Freq: Four times a day (QID) | ORAL | Status: DC | PRN
  Filled 2021-11-11: qty 1

## 2021-11-11 MED ORDER — MIDAZOLAM HCL 2 MG/2ML IJ SOLN
INTRAMUSCULAR | Status: AC
  Filled 2021-11-11: qty 2

## 2021-11-11 MED ORDER — METOCLOPRAMIDE HCL 5 MG/ML IJ SOLN: 5.0000 mg | Freq: Three times a day (TID) | INTRAMUSCULAR | Status: DC | PRN

## 2021-11-11 MED ORDER — TRANEXAMIC ACID-NACL 1000-0.7 MG/100ML-% IV SOLN
1000.0000 mg | Freq: Once | INTRAVENOUS | Status: AC
  Administered 2021-11-11: 11:00:00 1000 mg via INTRAVENOUS

## 2021-11-11 MED ORDER — BUPIVACAINE IN DEXTROSE 0.75-8.25 % IT SOLN
INTRATHECAL | Status: DC | PRN
  Administered 2021-11-11: 1.6 mL via INTRATHECAL

## 2021-11-11 MED ORDER — LACTATED RINGERS IV BOLUS
250.0000 mL | Freq: Once | INTRAVENOUS | Status: AC
  Administered 2021-11-11: 11:00:00 250 mL via INTRAVENOUS

## 2021-11-11 MED ORDER — LIDOCAINE HCL (PF) 2 % IJ SOLN
INTRAMUSCULAR | Status: AC
  Filled 2021-11-11: qty 5

## 2021-11-11 MED ORDER — ROPIVACAINE HCL 5 MG/ML IJ SOLN
INTRAMUSCULAR | Status: DC | PRN
  Administered 2021-11-11: 30 mL via PERINEURAL

## 2021-11-11 MED ORDER — DEXAMETHASONE SODIUM PHOSPHATE 10 MG/ML IJ SOLN
INTRAMUSCULAR | Status: DC | PRN
  Administered 2021-11-11: 8 mg via INTRAVENOUS

## 2021-11-11 MED ORDER — ONDANSETRON HCL 4 MG/2ML IJ SOLN: 4.0000 mg | Freq: Four times a day (QID) | INTRAMUSCULAR | Status: DC | PRN

## 2021-11-11 MED ORDER — HYDROCODONE-ACETAMINOPHEN 5-325 MG PO TABS: 1.0000 | ORAL_TABLET | ORAL | Status: DC | PRN

## 2021-11-11 MED ORDER — METHOCARBAMOL 500 MG IVPB - SIMPLE MED: 500.0000 mg | Freq: Four times a day (QID) | INTRAVENOUS | Status: DC | PRN

## 2021-11-11 MED ORDER — FENTANYL CITRATE (PF) 100 MCG/2ML IJ SOLN
INTRAMUSCULAR | Status: DC | PRN
  Administered 2021-11-11: 100 ug via INTRAVENOUS

## 2021-11-11 MED ORDER — KETOROLAC TROMETHAMINE 30 MG/ML IJ SOLN
INTRAMUSCULAR | Status: DC | PRN
  Administered 2021-11-11: 30 mg

## 2021-11-11 MED ORDER — HYDROCODONE-ACETAMINOPHEN 7.5-325 MG PO TABS: 1.0000 | ORAL_TABLET | ORAL | Status: DC | PRN

## 2021-11-11 MED ORDER — CHLORHEXIDINE GLUCONATE 0.12 % MT SOLN
15.0000 mL | Freq: Once | OROMUCOSAL | Status: AC
  Administered 2021-11-11: 06:00:00 15 mL via OROMUCOSAL

## 2021-11-11 MED ORDER — SODIUM CHLORIDE (PF) 0.9 % IJ SOLN
INTRAMUSCULAR | Status: DC | PRN
  Administered 2021-11-11: 30 mL

## 2021-11-11 MED ORDER — LACTATED RINGERS IV BOLUS
500.0000 mL | Freq: Once | INTRAVENOUS | Status: AC
  Administered 2021-11-11: 09:00:00 500 mL via INTRAVENOUS

## 2021-11-11 MED ORDER — ONDANSETRON HCL 4 MG/2ML IJ SOLN
INTRAMUSCULAR | Status: AC
  Filled 2021-11-11: qty 2

## 2021-11-11 MED ORDER — CELECOXIB 200 MG PO CAPS: 200.0000 mg | ORAL_CAPSULE | Freq: Two times a day (BID) | ORAL | 0 refills | Status: AC

## 2021-11-11 MED ORDER — METHOCARBAMOL 500 MG PO TABS
500.0000 mg | ORAL_TABLET | Freq: Four times a day (QID) | ORAL | Status: DC | PRN
  Administered 2021-11-11: 14:00:00 500 mg via ORAL

## 2021-11-11 MED ORDER — MIDAZOLAM HCL 2 MG/2ML IJ SOLN
INTRAMUSCULAR | Status: DC | PRN
  Administered 2021-11-11: 2 mg via INTRAVENOUS

## 2021-11-11 MED ORDER — FENTANYL CITRATE (PF) 100 MCG/2ML IJ SOLN
INTRAMUSCULAR | Status: AC
  Filled 2021-11-11: qty 2

## 2021-11-11 SURGICAL SUPPLY — 43 items
ADH SKN CLS APL DERMABOND .7 (GAUZE/BANDAGES/DRESSINGS) ×1
BAG COUNTER SPONGE SURGICOUNT (BAG) IMPLANT
BAG SPEC THK2 15X12 ZIP CLS (MISCELLANEOUS)
BAG SPNG CNTER NS LX DISP (BAG)
BAG SURGICOUNT SPONGE COUNTING (BAG)
BAG ZIPLOCK 12X15 (MISCELLANEOUS) IMPLANT
BEARING TIBIAL OXFORD R XS S7 (Knees) ×2 IMPLANT
BLADE SAW RECIPROCATING 77.5 (BLADE) ×1 IMPLANT
BLADE SAW SGTL 13.0X1.19X90.0M (BLADE) ×3 IMPLANT
BNDG ELASTIC 6X5.8 VLCR STR LF (GAUZE/BANDAGES/DRESSINGS) ×3 IMPLANT
BOWL SMART MIX CTS (DISPOSABLE) ×3 IMPLANT
BRNG TIB XS 7 BYNT PNT SMTH (Knees) ×1 IMPLANT
COVER SURGICAL LIGHT HANDLE (MISCELLANEOUS) ×3 IMPLANT
CUFF TOURN SGL QUICK 34 (TOURNIQUET CUFF) ×3
CUFF TRNQT CYL 34X4.125X (TOURNIQUET CUFF) ×1 IMPLANT
DECANTER SPIKE VIAL GLASS SM (MISCELLANEOUS) ×6 IMPLANT
DERMABOND ADVANCED (GAUZE/BANDAGES/DRESSINGS) ×2
DERMABOND ADVANCED .7 DNX12 (GAUZE/BANDAGES/DRESSINGS) ×1 IMPLANT
DRAPE U-SHAPE 47X51 STRL (DRAPES) ×3 IMPLANT
DRESSING AQUACEL AG SP 3.5X10 (GAUZE/BANDAGES/DRESSINGS) ×1 IMPLANT
DRSG AQUACEL AG SP 3.5X10 (GAUZE/BANDAGES/DRESSINGS) ×3
DURAPREP 26ML APPLICATOR (WOUND CARE) ×3 IMPLANT
ELECT REM PT RETURN 15FT ADLT (MISCELLANEOUS) ×3 IMPLANT
GLOVE SURG ENC MOIS LTX SZ6 (GLOVE) ×3 IMPLANT
GLOVE SURG ENC MOIS LTX SZ7 (GLOVE) ×3 IMPLANT
GLOVE SURG UNDER LTX SZ6.5 (GLOVE) ×3 IMPLANT
GLOVE SURG UNDER POLY LF SZ7.5 (GLOVE) ×9 IMPLANT
GOWN STRL REUS W/TWL LRG LVL3 (GOWN DISPOSABLE) ×3 IMPLANT
HOLDER FOLEY CATH W/STRAP (MISCELLANEOUS) IMPLANT
KIT TURNOVER KIT A (KITS) IMPLANT
MANIFOLD NEPTUNE II (INSTRUMENTS) ×3 IMPLANT
NDL SAFETY ECLIPSE 18X1.5 (NEEDLE) ×1 IMPLANT
NEEDLE HYPO 18GX1.5 SHARP (NEEDLE) ×3
PACK TOTAL KNEE CUSTOM (KITS) ×3 IMPLANT
SET PAD KNEE POSITIONER (MISCELLANEOUS) ×3 IMPLANT
SUT MNCRL AB 4-0 PS2 18 (SUTURE) ×3 IMPLANT
SUT STRATAFIX PDS+ 0 24IN (SUTURE) ×3 IMPLANT
SUT VIC AB 1 CT1 36 (SUTURE) ×3 IMPLANT
SUT VIC AB 2-0 CT1 27 (SUTURE) ×6
SUT VIC AB 2-0 CT1 TAPERPNT 27 (SUTURE) ×2 IMPLANT
SYR 3ML LL SCALE MARK (SYRINGE) ×3 IMPLANT
TRAY FOLEY MTR SLVR 16FR STAT (SET/KITS/TRAYS/PACK) ×3 IMPLANT
WRAP KNEE MAXI GEL POST OP (GAUZE/BANDAGES/DRESSINGS) ×3 IMPLANT

## 2021-11-11 NOTE — Interval H&P Note (Signed)
History and Physical Interval Note:  11/11/2021 6:24 AM  Shelly [name]  has presented today for surgery, with the diagnosis of Right unicompartmental failed poly.  The various methods of treatment have been discussed with the patient and family. After consideration of risks, benefits and other options for treatment, the patient has consented to  Procedure(s): Right unicompartmental knee arthroplasty poly revision (Right) as a surgical intervention.  The patient's history has been reviewed, patient examined, no change in status, stable for surgery.  I have reviewed the patient's chart and labs.  Questions were answered to the patient's satisfaction.     Shelda Pal

## 2021-11-11 NOTE — Anesthesia Procedure Notes (Signed)
Procedure Name: MAC Date/Time: 11/11/2021 7:15 AM Performed by: Eben Burow, CRNA Pre-anesthesia Checklist: Patient identified, Emergency Drugs available, Suction available, Patient being monitored and Timeout performed Oxygen Delivery Method: Simple face mask Placement Confirmation: positive ETCO2

## 2021-11-11 NOTE — Anesthesia Procedure Notes (Signed)
Spinal  Patient location during procedure: OR Start time: 11/11/2021 7:20 AM Reason for block: surgical anesthesia Staffing Anesthesiologist: Lidia Collum, MD Resident/CRNA: Eben Burow, CRNA Preanesthetic Checklist Completed: patient identified, IV checked, site marked, risks and benefits discussed, surgical consent, monitors and equipment checked, pre-op evaluation and timeout performed Spinal Block Patient position: sitting Prep: DuraPrep and site prepped and draped Patient monitoring: heart rate, cardiac monitor, continuous pulse ox and blood pressure Approach: midline Location: L3-4 Injection technique: single-shot Needle Needle type: Pencan  Needle gauge: 24 G Needle length: 10 cm Assessment Events: CSF return Additional Notes Pt placed in sitting position, spinal kit expiration date checked and verified, + CSF, - heme, pt tolerated well. Adequate sensory level. Dr Christella Hartigan present and supervising placement of SAB.

## 2021-11-11 NOTE — Discharge Instructions (Signed)

## 2021-11-11 NOTE — Transfer of Care (Signed)
Immediate Anesthesia Transfer of Care Note  Patient: Shelly [name]  Procedure(s) Performed: Right unicompartmental knee arthroplasty poly revision (Right: Knee)  Patient Location: PACU  Anesthesia Type:Spinal  Level of Consciousness: awake, alert  and patient cooperative  Airway & Oxygen Therapy: Patient Spontanous Breathing and Patient connected to face mask oxygen  Post-op Assessment: Report given to RN and Post -op Vital signs reviewed and stable  Post vital signs: Reviewed and stable  Last Vitals:  Vitals Value Taken Time  BP 113/56 11/11/21 0831  Temp    Pulse 57 11/11/21 0833  Resp 14 11/11/21 0833  SpO2 100 % 11/11/21 0833  Vitals shown include unvalidated device data.  Last Pain:  Vitals:   11/11/21 0554  TempSrc:   PainSc: 0-No pain      Patients Stated Pain Goal: 3 (11/11/21 0554)  Complications: No notable events documented.

## 2021-11-11 NOTE — Brief Op Note (Signed)
11/11/2021  8:09 AM  PATIENT:  Leland Her  50 y.o. 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  POST-OPERATIVE DIAGNOSIS:  Failed right partial knee arthroplasty due to dislocated polyethylene  PROCEDURE:  Procedure(s): Right unicompartmental knee arthroplasty poly revision (Right)  SURGEON:  Surgeon(s) and Role:    Durene Romans, MD - Primary  PHYSICIAN ASSISTANT: Rosalene Billings, PA-C  ANESTHESIA:   regional and spinal  EBL:  20 mL   BLOOD ADMINISTERED:none  DRAINS: none   LOCAL MEDICATIONS USED:  MARCAINE     SPECIMEN:  No Specimen  DISPOSITION OF SPECIMEN:  N/A  COUNTS:  YES  TOURNIQUET:  22 min at 225 mmHg  DICTATION: .Other Dictation: Dictation Number [id number]  PLAN OF CARE: Discharge to home after PACU  PATIENT DISPOSITION:  PACU - hemodynamically stable.   Delay start of Pharmacological VTE agent (>24hrs) due to surgical blood loss or risk of bleeding: no

## 2021-11-11 NOTE — Op Note (Signed)
NAME: Shelly [name], Shelly [name] MEDICAL RECORD NO: [id number] ACCOUNT NO: [id number] DATE OF BIRTH: 07-23-1971 FACILITY: [facility] LOCATION: [facility] PHYSICIAN: Madlyn Frankel. Charlann Boxer, MD  Operative Report   DATE OF PROCEDURE: 11/11/2021  PREOPERATIVE DIAGNOSIS:  Failed right partial knee arthroplasty with dislocated polyethylene.  POSTOPERATIVE DIAGNOSIS:  Failed right partial knee arthroplasty with dislocated polyethylene.  PROCEDURE:  Revision right partial knee arthroplasty, single component polyethylene revision.  COMPONENTS USED:  Biomet Oxford polyethylene bearing to match an extra small femur.  I used a size 7 polyethylene upsizing her from a size 5.  SURGEON: Dr. Charlann Boxer.  ASSISTANT:  Rosalene Billings, PA-C.  Shelly [name] was present for the entirety of the case from perioperative positioning, perioperative management of the operative extremity, general facilitation of the case and primary wound closure.  ANESTHESIA:  Regional plus spinal.  ESTIMATED BLOOD LOSS:  Minimal.  TOURNIQUET TIME:  Up for 22 minutes at 225 mmHg.  DRAINS:  None.  COMPLICATIONS:  None.  INDICATIONS FOR THE PROCEDURE:  The patient is a very pleasant 50 year old female with a history of right partial knee arthroplasty.  She was doing well until a couple weeks ago when she reportedly was sitting on a sofa and hyperflexed her knee as she  normally does in a seated position.  When she did it at that time, she felt a large pop.  She initially presented to the Emergency Room where it was unrecognized that her polyethylene had dislocated.  She was subsequently seen in our office with obvious  findings radiographically.  At that time, I discussed with her revising her polyethylene.  She wished to schedule at this date for surgery.  We reviewed the risks of this occurring again.  We discussed that I would upsize her polyethylene to provide  stability to the knee to try to prevent this from recurring again.  I also discussed options  of converting to a total knee replacement, which she was not interested in.  I also discussed converting it to a fixed bearing surface; however, related to  potential bone loss in the proximal medial tibia and lack of adequate fixation.  We opted to revise the polyethylene as discussed.  Standard risk for infection and DVT were reviewed as well.  Consent was obtained for management of this issue.  DESCRIPTION OF PROCEDURE:  The patient was brought to the operative theater.  Once adequate anesthesia, preoperative antibiotics, Ancef administered, she was positioned supine with a right thigh tourniquet placed.  Her right leg was then positioned over  the Oxford leg holder to allow for flexion.  The right lower extremity was then prepped and draped in sterile fashion.  A timeout was performed identifying the patient, planned procedure, and extremity.  The leg was exsanguinated and the tourniquet  elevated to 225 mmHg.  Her old incision was excised, which was only a 2 mm of scar.  Soft tissue planes were created.  A median arthrotomy was made, encountering clear synovial fluid with slight blood tinging related to the trauma.  There were no signs  of infection.  Following exposure of the polyethylene, it was readily visible on the anterior aspect of the knee.  Following initial exposure and scar debridement, I did evaluate the posterior aspect of the knee.  There was some scar tissue that I  removed.  I then did trials with a size 6 and then a 7 insert.  I felt that both were stable, but based on the incidence of this occurring, I did elect to  use a size 7 insert.  The final 7 insert was opened.  We irrigated the knee with normal saline  solution.  Once I confirmed that I was satisfied with the exposure as well as a debridement on the medial and lateral aspect of the components as well as in particularly posteriorly, we snapped in the new insert.  I then injected the synovial capsular  junction of the knee with  0.25% Marcaine with epinephrine, 1 mL of Toradol and saline.  The extensor mechanism was then reapproximated using #1 Vicryl and #1 Stratafix suture.  The remainder of the wound was closed in layers using 2-0 Vicryl and a  running Monocryl stitch.  The knee was cleaned, dried and dressed sterilely using surgical glue and Aquacel dressing.  The patient was then brought to the recovery room in stable condition, tolerating the procedure well with plans to be discharged from  the PACU.  We will follow up with her in 2 weeks.   MUK D: 11/11/2021 8:17:12 am T: 11/11/2021 8:53:00 am  JOB: [id number]/ [id number]

## 2021-11-11 NOTE — Anesthesia Procedure Notes (Signed)
Anesthesia Regional Block: Adductor canal block   Pre-Anesthetic Checklist: , timeout performed,  Correct Patient, Correct Site, Correct Laterality,  Correct Procedure, Correct Position, site marked,  Risks and benefits discussed,  Surgical consent,  Pre-op evaluation,  At surgeon's request and post-op pain management  Laterality: Right  Prep: chloraprep       Needles:  Injection technique: Single-shot  Needle Type: Echogenic Stimulator Needle     Needle Length: 10cm  Needle Gauge: 20     Additional Needles:   Procedures:,,,, ultrasound used (permanent image in chart),,    Narrative:  Start time: 11/11/2021 6:59 AM End time: 11/11/2021 7:03 AM Injection made incrementally with aspirations every 5 mL.  Performed by: Personally  Anesthesiologist: Lucretia Kern, MD  Additional Notes: Standard monitors applied. Skin prepped. Good needle visualization with ultrasound. Injection made in 5cc increments with no resistance to injection. Patient tolerated the procedure well.

## 2021-11-11 NOTE — Anesthesia Postprocedure Evaluation (Signed)
Anesthesia Post Note  Patient: JAYLYN IYER  Procedure(s) Performed: Right unicompartmental knee arthroplasty poly revision (Right: Knee)     Patient location during evaluation: PACU Anesthesia Type: Spinal Level of consciousness: oriented and awake and alert Pain management: pain level controlled Vital Signs Assessment: post-procedure vital signs reviewed and stable Respiratory status: spontaneous breathing, respiratory function stable and nonlabored ventilation Cardiovascular status: blood pressure returned to baseline and stable Postop Assessment: no headache, no backache, no apparent nausea or vomiting and spinal receding Anesthetic complications: no   No notable events documented.  Last Vitals:  Vitals:   11/11/21 1200 11/11/21 1400  BP: (!) 153/83 (!) 145/75  Pulse: 66 70  Resp:    Temp:    SpO2: 100% 100%    Last Pain:  Vitals:   11/11/21 1100  TempSrc:   PainSc: 0-No pain                 Lucretia Kern

## 2021-11-11 NOTE — Evaluation (Signed)
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 s/p Right unicompartmental knee arthroplasty poly revision on 11/11/21 per Dr Olin. . PMH: depression, anxiety  °Clinical Impression ° Patient evaluated by Physical Therapy with no further acute PT needs identified. All education has been completed and the patient has no further questions.  °Reviewed areas as noted below, TKA HEP, precautions and safety with mobility. Pt overall supervision, will have husband assist as needed. Ready for d/c from PT standpoint with family assist prn.  ° See below for any follow-up Physical Therapy or equipment needs. PT is signing off. Thank you for this referral. °   °   ° °Recommendations for follow up therapy are one component of a multi-disciplinary discharge planning process, led by the attending physician.  Recommendations may be updated based on patient status, additional functional criteria and insurance authorization. ° °Follow Up Recommendations Follow physician's recommendations for discharge plan and follow up therapies ° °  °Assistance Recommended at Discharge PRN  °Functional Status Assessment Patient has had a recent decline in their functional status and demonstrates the ability to make significant improvements in function in a reasonable and predictable amount of time.  °Equipment Recommendations ° None recommended by PT  °  °Recommendations for Other Services    ° °  °Precautions / Restrictions Precautions °Precautions: Knee °Restrictions °Weight Bearing Restrictions: No °Other Position/Activity Restrictions: WBAT  ° °  ° °Mobility ° Bed Mobility °Overal bed mobility: Needs Assistance °Bed Mobility: Supine to Sit;Sit to Supine °  °  °Supine to sit: Supervision °Sit to supine: Supervision °  °General bed mobility comments: for  safety d/t ht of stretcher °  ° °Transfers °Overall transfer level: Needs assistance °Equipment used: Rolling walker (2 wheels) °Transfers: Sit to/from Stand °Sit to Stand: Supervision;Min guard °  °  °  °  °  °General transfer comment: for safety, cues for hand placement °  ° °Ambulation/Gait °Ambulation/Gait assistance: Min guard;Supervision °Gait Distance (Feet): 100 Feet °Assistive device: Rolling walker (2 wheels) °Gait Pattern/deviations: Step-to pattern;Decreased stance time - right °  °  °  °General Gait Details: cues for sequence initially, good stability with RW ° °Stairs °Stairs:  (pt verbalizes stair technique forward with RW, declines practice d/t havign experience with other surgery, feels comfortable with techniques) °  °  °  °  ° °Wheelchair Mobility °  ° °Modified Rankin (Stroke Patients Only) °  ° °  ° °Balance Overall balance assessment: No apparent balance deficits (not formally assessed) °  °  °  °  °  °  °  °  °  °  °  °  °  °  °  °  °  °  °   ° ° ° °Pertinent Vitals/Pain Pain Assessment: No/denies pain  ° ° °Home Living Family/patient expects to be discharged to:: Private residence °Living Arrangements: Spouse/significant other;Children °Available Help at Discharge: Family °Type of Home: House °Home Access: Stairs to enter °  °Entrance Stairs-Number of Steps: 2 °  °Home Layout: Able to live on main level with bedroom/bathroom °Home Equipment: Rolling Walker (2 wheels) °Additional Comments: pt teaches Ballet  °  °Prior Function Prior Level of Function : Independent/Modified Independent °  °  °  °  °  °  °Mobility Comments: recently amb with RW d/t knee issues °  °  ° ° °  Hand Dominance  °   ° °  °Extremity/Trunk Assessment  ° Upper Extremity Assessment °Upper Extremity Assessment: Overall WFL for tasks assessed °  ° °Lower Extremity Assessment °Lower Extremity Assessment: RLE deficits/detail °RLE Deficits / Details: knee flexion ~ 5 to 70 degrees. strength grossly 3 to 3+/5 knee flexion and  extension.  hip and ankle grossly WFL °  ° °   °Communication  ° Communication: No difficulties  °Cognition Arousal/Alertness: Awake/alert °Behavior During Therapy: WFL for tasks assessed/performed °Overall Cognitive Status: Within Functional Limits for tasks assessed °  °  °  °  °  °  °  °  °  °  °  °  °  °  °  °  °  °  °  ° °  °General Comments   ° °  °Exercises Total Joint Exercises °Ankle Circles/Pumps: AROM;10 reps °Quad Sets: 10 reps;AROM;Both °Short Arc Quad: AROM;Right;10 reps;Strengthening °Heel Slides: AROM;AAROM;Right;10 reps °Hip ABduction/ADduction: AROM;Right;10 reps °Straight Leg Raises: AROM;Right;10 reps  ° °Assessment/Plan  °  °PT Assessment All further PT needs can be met in the next venue of care  °PT Problem List   ° °   °  °PT Treatment Interventions     ° °PT Goals (Current goals can be found in the Care Plan section)  °Acute Rehab PT Goals °PT Goal Formulation: All assessment and education complete, DC therapy ° °  °Frequency   °  °Barriers to discharge   °   ° ° °Co-evaluation   °  °  °  °  ° ° °  °AM-PAC PT "6 Clicks" Mobility  °Outcome Measure Help needed turning from your back to your side while in a flat bed without using bedrails?: None °Help needed moving from lying on your back to sitting on the side of a flat bed without using bedrails?: None °Help needed moving to and from a bed to a chair (including a wheelchair)?: A Little °Help needed standing up from a chair using your arms (e.g., wheelchair or bedside chair)?: A Little °Help needed to walk in hospital room?: A Little °Help needed climbing 3-5 steps with a railing? : A Little °6 Click Score: 20 ° °  °End of Session Equipment Utilized During Treatment: Gait belt °Activity Tolerance: Patient tolerated treatment well °Patient left: in bed;with call bell/phone within reach °Nurse Communication: Mobility status °PT Visit Diagnosis: Difficulty in walking, not elsewhere classified (R26.2) °  ° °Time: 1203-1224 °PT Time Calculation  (min) (ACUTE ONLY): 21 min ° ° °Charges:   PT Evaluation °$PT Eval Low Complexity: 1 Low °  °  °   ° ° °Tara, PT ° °Acute Rehab Dept ([facility]) [phone] °Pager [phone] ° °11/11/2021 ° ° °WILLIAMS,TARA °11/11/2021, 12:41 PM °

## 2021-11-12 ENCOUNTER — Encounter (HOSPITAL_COMMUNITY): Payer: Self-pay | Admitting: Orthopedic Surgery

## 2021-11-16 ENCOUNTER — Encounter: Payer: Self-pay | Admitting: Family Medicine

## 2022-05-15 IMAGING — DX DG KNEE COMPLETE 4+V*R*
4 series · 4 of 4 positions shown · non-contrast
Comparison: 03/19/2016

CLINICAL DATA: Twisting injury with right knee pain, initial
encounter

EXAM:
RIGHT KNEE - COMPLETE 4+ VIEW

[knee ap]
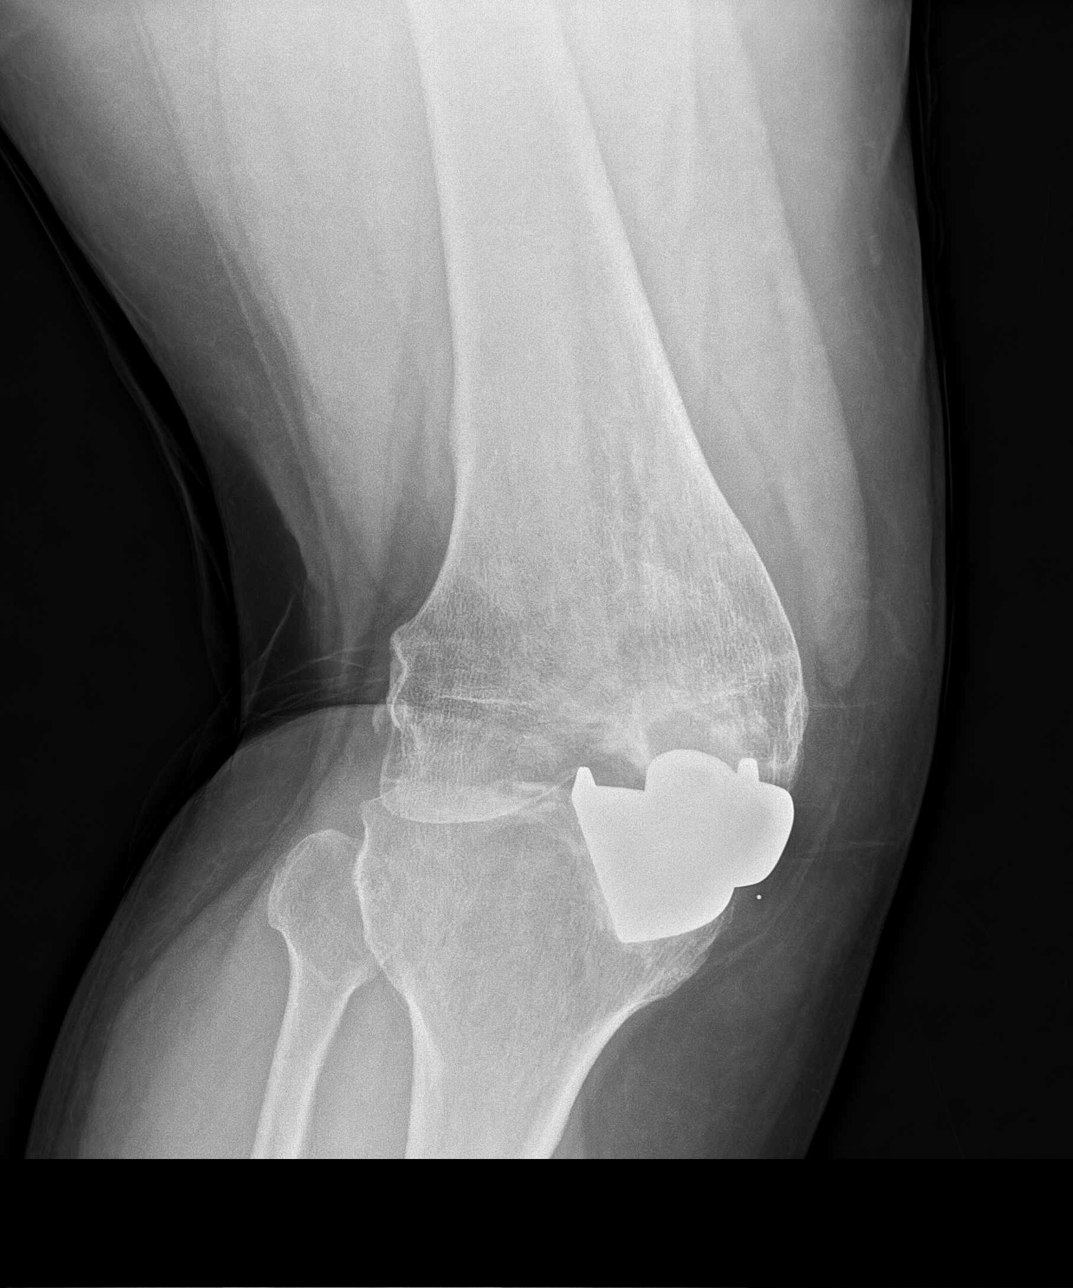

[knee lat]
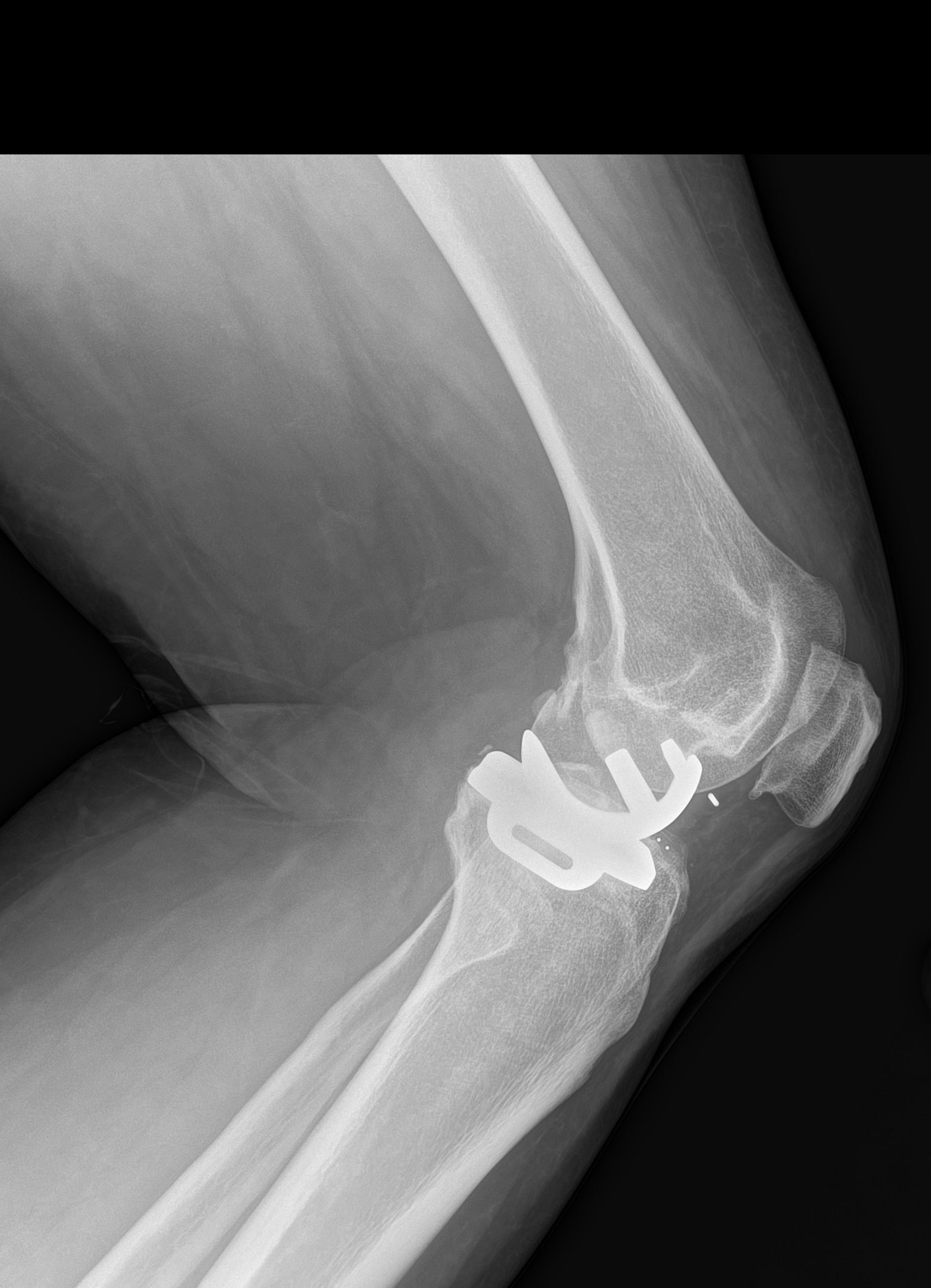

[knee obl (1 of 2)]
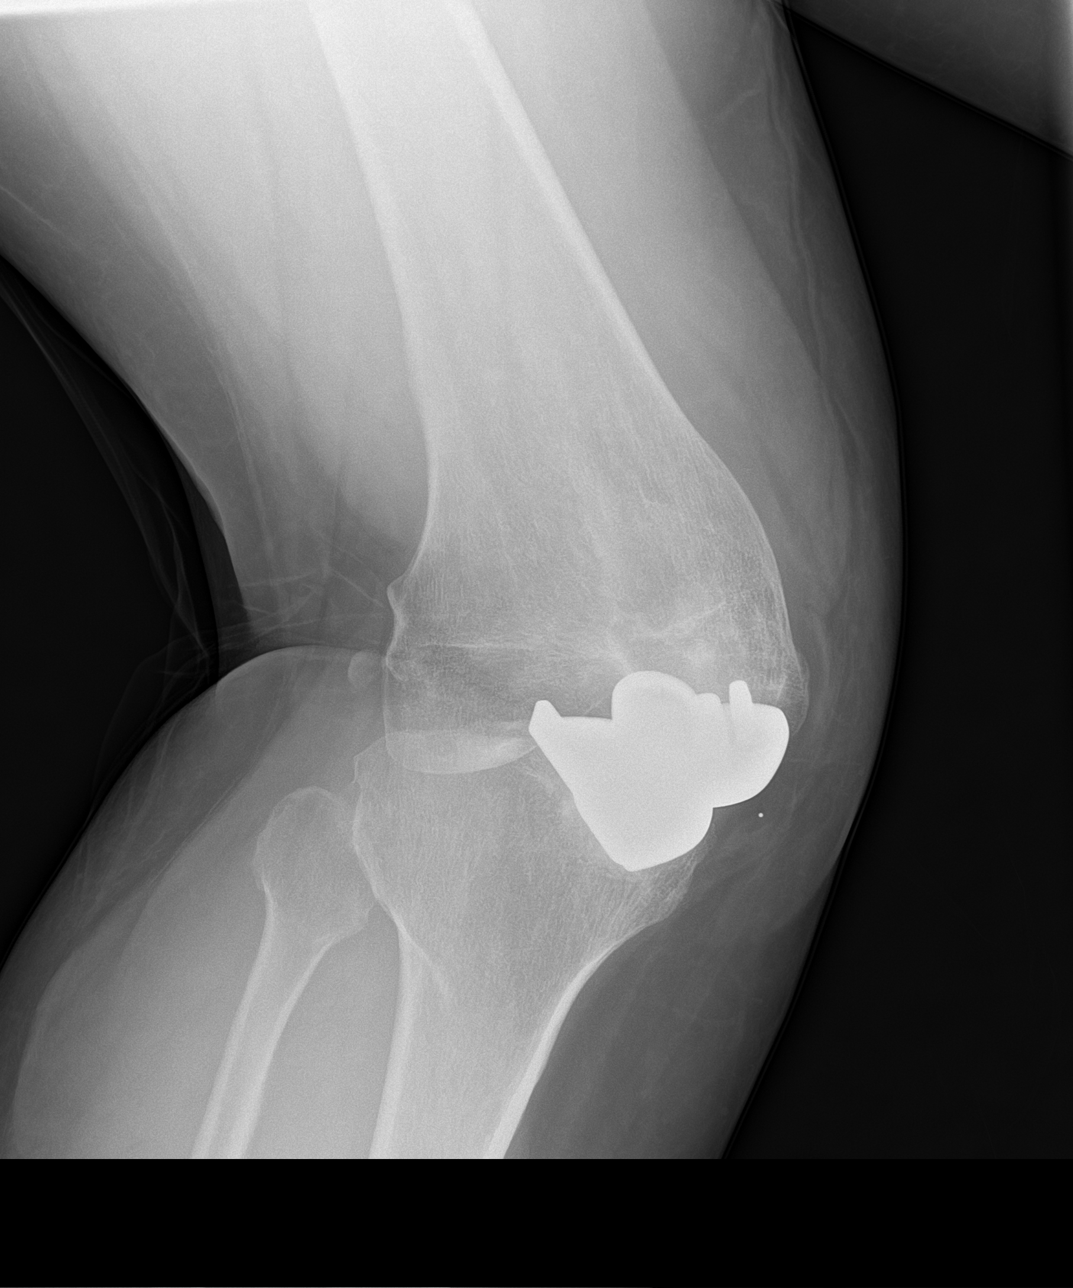

[knee obl (2 of 2)]
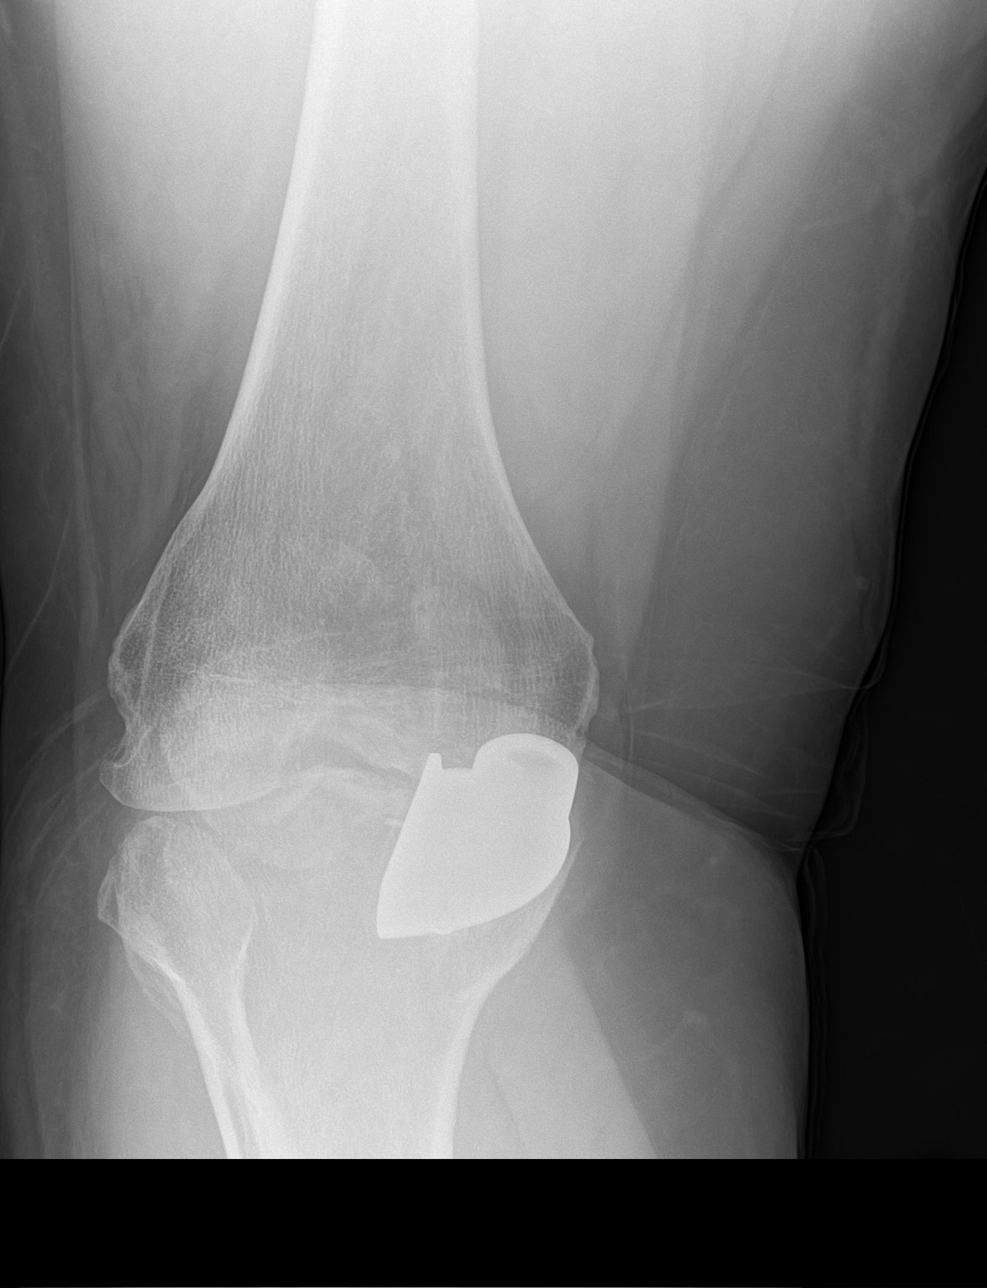

[4 of 4 positions shown; findings below may reference images not displayed]

FINDINGS: Partial medial joint replacement is noted. Patellofemoral
degenerative changes are seen. Mild lateral joint space degenerative
changes noted as well. No acute fracture or effusion is seen.
IMPRESSION: Postsurgical and degenerative changes without acute abnormality.

## 2022-06-27 ENCOUNTER — Other Ambulatory Visit: Payer: Self-pay | Admitting: Family Medicine

## 2022-06-27 DIAGNOSIS — F419 Anxiety disorder, unspecified: Secondary | ICD-10-CM

## 2022-06-27 DIAGNOSIS — F3341 Major depressive disorder, recurrent, in partial remission: Secondary | ICD-10-CM

## 2022-07-21 NOTE — Progress Notes (Signed)
HPI: Shelly [name] is a 51 y.o. female, who is here today for her routine physical.  Last CPE: 2017, gyn examination.  She exercises regularly, she is a Statistician, 2 hours class once per week.She is also very active with chores around her house. In general she feels like she follows a healthful diet. Low fat, no fried food, plenty of vegetables and protein. She does not snack much. Sleeps about 6-6.5 hours.  Chronic medical problems: Depression,insomnia, GERD,asthma, anxiety, seasonal allergies among some.  Depression and anxiety on Fluoxetine 20 mg daily. Medication is still helping with symptoms. No side effects.    07/22/2022    8:05 AM 06/10/2021   10:47 AM 07/03/2019    3:55 PM  Depression screen PHQ 2/9  Decreased Interest 0 0 0  Down, Depressed, Hopeless 0 0 2  PHQ - 2 Score 0 0 2  Altered sleeping 2 3 3   Tired, decreased energy 3 0 1  Change in appetite 0 0 1  Feeling bad or failure about yourself  0 3 0  Trouble concentrating 1 0 1  Moving slowly or fidgety/restless 0 0 0  Suicidal thoughts 0 0 1  PHQ-9 Score 6 6 9   Difficult doing work/chores Somewhat difficult Somewhat difficult Somewhat difficult   Immunization History  Administered Date(s) Administered   Td 08/20/2008   Tdap 08/20/2008   Health Maintenance  Topic Date Due   COLONOSCOPY (Pts 45-55yrs Insurance coverage will need to be confirmed)  Never done   PAP SMEAR-Modifier  05/06/2018   INFLUENZA VACCINE  Never done   COVID-19 Vaccine (1) 08/07/2022 (Originally 06/21/1971)   MAMMOGRAM  07/23/2023 (Originally 12/22/2021)   Zoster Vaccines- Shingrix (2 of 2) 09/16/2022   TETANUS/TDAP  07/22/2032   Hepatitis C Screening  Completed   HIV Screening  Completed   HPV VACCINES  Aged Out   She has no concerns today.  Review of Systems  Constitutional:  Positive for fatigue. Negative for activity change, appetite change and fever.  HENT:  Negative for hearing loss, mouth sores, sore throat and  trouble swallowing.   Eyes:  Negative for redness and visual disturbance.  Respiratory:  Negative for cough, shortness of breath and wheezing.   Cardiovascular:  Negative for chest pain and leg swelling.  Gastrointestinal:  Negative for abdominal pain, nausea and vomiting.       No changes in bowel habits.  Endocrine: Negative for cold intolerance, heat intolerance, polydipsia, polyphagia and polyuria.  Genitourinary:  Negative for decreased urine volume, dysuria, hematuria, vaginal bleeding and vaginal discharge.  Musculoskeletal:  Positive for arthralgias. Negative for gait problem and joint swelling.  Skin:  Negative for color change and rash.  Allergic/Immunologic: Positive for environmental allergies.  Neurological:  Negative for seizures, syncope, weakness and headaches.  Hematological:  Negative for adenopathy. Does not bruise/bleed easily.  Psychiatric/Behavioral:  Positive for sleep disturbance. Negative for confusion. The patient is nervous/anxious.   All other systems reviewed and are negative.  Current Outpatient Medications on File Prior to Visit  Medication Sig Dispense Refill   cetirizine (ZYRTEC) 10 MG tablet Take 10 mg by mouth daily.     FLUoxetine (PROZAC) 20 MG capsule TAKE 1 CAPSULE BY MOUTH EVERY DAY 90 capsule 1   Melatonin 10 MG CAPS Take 20 mg by mouth at bedtime as needed (sleep).     omeprazole (PRILOSEC) 20 MG capsule Take 20 mg by mouth daily.     OVER THE COUNTER MEDICATION Take 1 capsule by  mouth at bedtime as needed (sleep). CBD     No current facility-administered medications on file prior to visit.   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 08/2021   Carpal tunnel syndrome    Right   Depression    GERD (gastroesophageal reflux disease)    Intermittent palpitations    Loose body of right knee    Migraines     Osteochondral defect    right knee   Past Surgical History:  Procedure Laterality Date   CESAREAN SECTION  06-03-2011//  09-27-2003//  2000   Bilateral Tubal Ligation w/ last c/s   CHONDROPLASTY Right 06/11/2015   Procedure: CHONDROPLASTY;  Surgeon: Eugenia Mcalpine, MD;  Location: [facility];  Service: Orthopedics;  Laterality: Right;   DILATION AND CURETTAGE OF UTERUS     FOREIGN BODY REMOVAL Right 06/11/2015   Procedure: RIGHT REMOVAL LOOSE BODY;  Surgeon: Eugenia Mcalpine, MD;  Location: [facility];  Service: Orthopedics;  Laterality: Right;   KNEE ARTHROSCOPY Right 11/23/1988   KNEE ARTHROSCOPY Right 06/11/2015   Procedure: RIGHT ARTHROSCOPY KNEE;  Surgeon: Eugenia Mcalpine, MD;  Location: [facility];  Service: Orthopedics;  Laterality: Right;   LEFT WRIST REDUCTION AND FUSION   07/19/2008   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   PARTIAL KNEE ARTHROPLASTY Right 09/02/2015   Procedure: RIGHT UNI KNEE ARTHROPLASTY MEDIALLY;  Surgeon: Durene Romans, MD;  Location: [facility];  Service: Orthopedics;  Laterality: Right;   TOTAL KNEE REVISION Right 03/20/2016   Procedure: RIGHT UNI KNEE POLY EXCHANGE;  Surgeon: Durene Romans, MD;  Location: [facility];  Service: Orthopedics;  Laterality: Right;   TOTAL KNEE REVISION WITH SCAR DEBRIDEMENT/PATELLA REVISION WITH POLY EXCHANGE Right 11/11/2021   Procedure: Right unicompartmental knee arthroplasty poly revision;  Surgeon: Durene Romans, MD;  Location: [facility];  Service: Orthopedics;  Laterality: Right;   TUBAL LIGATION     WISDOM TOOTH EXTRACTION Bilateral    No Known Allergies  Family History  Problem Relation Age of Onset   COPD Father    Emphysema Father    Arthritis Mother    Alcohol abuse Paternal Grandmother    Cancer Paternal Grandmother    Social History   Socioeconomic History   Marital status: Married    Spouse name: Not on file   Number of children: Not on file   Years of education: Not on file    Highest education level: Not on file  Occupational History   Not on file  Tobacco Use   Smoking status: Former    Years: 6.00    Types: Cigarettes    Quit date: 06/10/1995    Years since quitting: 27.1   Smokeless tobacco: Never  Vaping Use   Vaping Use: Never used  Substance and Sexual Activity   Alcohol use: Yes    Alcohol/week: 0.0 standard drinks of alcohol    Comment: occasional   Drug use: No   Sexual activity: Yes    Partners: Male    Birth control/protection: Surgical  Other Topics Concern   Not on file  Social History Narrative   Not on file   Social Determinants of Health   Financial Resource Strain: Not on file  Food Insecurity: Not on file  Transportation Needs: Not on file  Physical Activity: Not on file  Stress:  Not on file  Social Connections: Not on file   Vitals:   07/22/22 0800  BP: 110/80  Pulse: 78  Resp: 12  Temp: 98.6 F (37 C)  SpO2: 98%  Body mass index is 34.51 kg/m. Wt Readings from Last 3 Encounters:  07/22/22 207 lb 6 oz (94.1 kg)  11/11/21 210 lb (95.3 kg)  09/18/21 209 lb (94.8 kg)   Physical Exam Vitals and nursing note reviewed.  Constitutional:      General: She is not in acute distress.    Appearance: She is well-developed.  HENT:     Head: Normocephalic and atraumatic.     Right Ear: Hearing, tympanic membrane, ear canal and external ear normal.     Left Ear: Hearing, tympanic membrane, ear canal and external ear normal.     Mouth/Throat:     Mouth: Mucous membranes are moist.     Pharynx: Oropharynx is clear. Uvula midline.  Eyes:     Extraocular Movements: Extraocular movements intact.     Conjunctiva/sclera: Conjunctivae normal.     Pupils: Pupils are equal, round, and reactive to light.  Neck:     Thyroid: No thyromegaly.     Trachea: No tracheal deviation.  Cardiovascular:     Rate and Rhythm: Normal rate and regular rhythm.     Pulses:          Dorsalis pedis pulses are 2+ on the right side and 2+ on the  left side.       Posterior tibial pulses are 2+ on the right side and 2+ on the left side.     Heart sounds: No murmur heard. Pulmonary:     Effort: Pulmonary effort is normal. No respiratory distress.     Breath sounds: Normal breath sounds.  Abdominal:     Palpations: Abdomen is soft. There is no hepatomegaly or mass.     Tenderness: There is no abdominal tenderness.  Genitourinary:    Comments: Deferred to gyn. Musculoskeletal:     Comments: No major deformity or signs of synovitis appreciated.  Lymphadenopathy:     Cervical: No cervical adenopathy.     Upper Body:     Right upper body: No supraclavicular adenopathy.     Left upper body: No supraclavicular adenopathy.  Skin:    General: Skin is warm.     Findings: No erythema or rash.  Neurological:     General: No focal deficit present.     Mental Status: She is alert and oriented to person, place, and time.     Cranial Nerves: No cranial nerve deficit.     Coordination: Coordination normal.     Gait: Gait normal.     Deep Tendon Reflexes:     Reflex Scores:      Bicep reflexes are 2+ on the right side and 2+ on the left side.      Patellar reflexes are 2+ on the right side and 2+ on the left side. Psychiatric:     Comments: Well groomed, good eye contact.   ASSESSMENT AND PLAN:  Shelly [name] was here today annual physical examination.  Orders Placed This Encounter  Procedures   Tdap vaccine greater than or equal to 7yo IM   Varicella-zoster vaccine IM   Lipid panel   Basic metabolic panel   Cologuard   Hemoglobin A1c   Ambulatory referral to Gynecology   Lab Results  Component Value Date   CREATININE 0.83 07/22/2022   BUN 15 07/22/2022  NA 138 07/22/2022   K 4.2 07/22/2022   CL 103 07/22/2022   CO2 28 07/22/2022   Lab Results  Component Value Date   CHOL 206 (H) 07/22/2022   HDL 51.20 07/22/2022   LDLCALC 121 (H) 07/22/2022   TRIG 166.0 (H) 07/22/2022   CHOLHDL 4 07/22/2022   Lab Results   Component Value Date   HGBA1C 6.1 07/22/2022   Routine general medical examination at a health care facility We discussed the importance of regular physical activity and healthy diet for prevention of chronic illness and/or complications. Preventive guidelines reviewed. Vaccination updated. Ca++ and vit D supplementation recommended. Continue female preventive care with gyn. Next CPE in a year.  The 10-year ASCVD risk score (Arnett DK, et al., 2019) is: 1.1%   Values used to calculate the score:     Age: 42 years     Sex: Female     Is Non-Hispanic African American: No     Diabetic: No     Tobacco smoker: No     Systolic Blood Pressure: 110 mmHg     Is BP treated: No     HDL Cholesterol: 51.2 mg/dL     Total Cholesterol: 206 mg/dL  Colon cancer screening -     Cologuard  Screening for lipoid disorders -     Lipid panel  Screening for endocrine, metabolic and immunity disorder -     Hemoglobin A1c -     Basic metabolic panel  Need for Tdap vaccination -     Tdap vaccine greater than or equal to 7yo IM  Need for shingles vaccine -     Varicella-zoster vaccine IM  Depression, major, recurrent, in partial remission (HCC) Stable. Continue Fluoxetine 20 mg daily. As far as problem is stable,we will continue annual follow ups.  Return in 1 year (on 07/23/2023) for CPE.  Kehinde Totzke G. [place], MD  [facility]. [place] office.

## 2022-07-22 ENCOUNTER — Ambulatory Visit (INDEPENDENT_AMBULATORY_CARE_PROVIDER_SITE_OTHER): Payer: No Typology Code available for payment source | Admitting: Family Medicine

## 2022-07-22 ENCOUNTER — Encounter: Payer: Self-pay | Admitting: Family Medicine

## 2022-07-22 VITALS — BP 110/80 | HR 78 | Temp 98.6°F | Resp 12 | Ht 65.0 in | Wt 207.4 lb

## 2022-07-22 DIAGNOSIS — R7303 Prediabetes: Secondary | ICD-10-CM

## 2022-07-22 DIAGNOSIS — Z13228 Encounter for screening for other metabolic disorders: Secondary | ICD-10-CM | POA: Diagnosis not present

## 2022-07-22 DIAGNOSIS — Z1211 Encounter for screening for malignant neoplasm of colon: Secondary | ICD-10-CM

## 2022-07-22 DIAGNOSIS — Z13220 Encounter for screening for lipoid disorders: Secondary | ICD-10-CM

## 2022-07-22 DIAGNOSIS — F3341 Major depressive disorder, recurrent, in partial remission: Secondary | ICD-10-CM

## 2022-07-22 DIAGNOSIS — Z130 Encounter for screening for diseases of the blood and blood-forming organs and certain disorders involving the immune mechanism: Secondary | ICD-10-CM

## 2022-07-22 DIAGNOSIS — Z0000 Encounter for general adult medical examination without abnormal findings: Secondary | ICD-10-CM | POA: Diagnosis not present

## 2022-07-22 DIAGNOSIS — Z23 Encounter for immunization: Secondary | ICD-10-CM

## 2022-07-22 DIAGNOSIS — Z1329 Encounter for screening for other suspected endocrine disorder: Secondary | ICD-10-CM | POA: Diagnosis not present

## 2022-07-22 DIAGNOSIS — E782 Mixed hyperlipidemia: Secondary | ICD-10-CM

## 2022-07-22 LAB — LIPID PANEL
Cholesterol: 206 mg/dL — ABNORMAL HIGH (ref 0–200)
HDL: 51.2 mg/dL (ref >39.00)
LDL Cholesterol: 121 mg/dL — ABNORMAL HIGH (ref 0–99)
NonHDL: 154.45
Total CHOL/HDL Ratio: 4
Triglycerides: 166 mg/dL — ABNORMAL HIGH (ref 0.0–149.0)
VLDL: 33.2 mg/dL (ref 0.0–40.0)

## 2022-07-22 LAB — BASIC METABOLIC PANEL
BUN: 15 mg/dL (ref 6–23)
CO2: 28 mEq/L (ref 19–32)
Calcium: 9.4 mg/dL (ref 8.4–10.5)
Chloride: 103 mEq/L (ref 96–112)
Creatinine, Ser: 0.83 mg/dL (ref 0.40–1.20)
GFR: 82.11 mL/min (ref >60.00)
Glucose, Bld: 99 mg/dL (ref 70–99)
Potassium: 4.2 mEq/L (ref 3.5–5.1)
Sodium: 138 mEq/L (ref 135–145)

## 2022-07-22 LAB — HEMOGLOBIN A1C: Hgb A1c MFr Bld: 6.1 % (ref 4.6–6.5)

## 2022-07-22 NOTE — Assessment & Plan Note (Signed)
Stable. Continue Fluoxetine 20 mg daily. As far as problem is stable,we will continue annual follow ups.

## 2022-07-22 NOTE — Patient Instructions (Addendum)
A few things to remember from today's visit:  Routine general medical examination at a health care facility - Plan: Ambulatory referral to Gynecology  Colon cancer screening - Plan: Cologuard  Screening for lipoid disorders - Plan: Lipid panel  Screening for endocrine, metabolic and immunity disorder - Plan: Basic metabolic panel, Hemoglobin A1c  If you need refills please call your pharmacy. Do not use My Chart to request refills or for acute issues that need immediate attention.   Calcium 1200 mg daily and 800 U of vit D.  Please be sure medication list is accurate. If a new problem present, please set up appointment sooner than planned today.  Health Maintenance, Female Adopting a healthy lifestyle and getting preventive care are important in promoting health and wellness. Ask your health care provider about: The right schedule for you to have regular tests and exams. Things you can do on your own to prevent diseases and keep yourself healthy. What should I know about diet, weight, and exercise? Eat a healthy diet  Eat a diet that includes plenty of vegetables, fruits, low-fat dairy products, and lean protein. Do not eat a lot of foods that are high in solid fats, added sugars, or sodium. Maintain a healthy weight Body mass index (BMI) is used to identify weight problems. It estimates body fat based on height and weight. Your health care provider can help determine your BMI and help you achieve or maintain a healthy weight. Get regular exercise Get regular exercise. This is one of the most important things you can do for your health. Most adults should: Exercise for at least 150 minutes each week. The exercise should increase your heart rate and make you sweat (moderate-intensity exercise). Do strengthening exercises at least twice a week. This is in addition to the moderate-intensity exercise. Spend less time sitting. Even light physical activity can be beneficial. Watch  cholesterol and blood lipids Have your blood tested for lipids and cholesterol at 50 years of age, then have this test every 5 years. Have your cholesterol levels checked more often if: Your lipid or cholesterol levels are high. You are older than 50 years of age. You are at high risk for heart disease. What should I know about cancer screening? Depending on your health history and family history, you may need to have cancer screening at various ages. This may include screening for: Breast cancer. Cervical cancer. Colorectal cancer. Skin cancer. Lung cancer. What should I know about heart disease, diabetes, and high blood pressure? Blood pressure and heart disease High blood pressure causes heart disease and increases the risk of stroke. This is more likely to develop in people who have high blood pressure readings or are overweight. Have your blood pressure checked: Every 3-5 years if you are 5-38 years of age. Every year if you are 22 years old or older. Diabetes Have regular diabetes screenings. This checks your fasting blood sugar level. Have the screening done: Once every three years after age 48 if you are at a normal weight and have a low risk for diabetes. More often and at a younger age if you are overweight or have a high risk for diabetes. What should I know about preventing infection? Hepatitis B If you have a higher risk for hepatitis B, you should be screened for this virus. Talk with your health care provider to find out if you are at risk for hepatitis B infection. Hepatitis C Testing is recommended for: Everyone born from [birth year] through 1964. Anyone  with known risk factors for hepatitis C. Sexually transmitted infections (STIs) Get screened for STIs, including gonorrhea and chlamydia, if: You are sexually active and are younger than 50 years of age. You are older than 50 years of age and your health care provider tells you that you are at risk for this type of  infection. Your sexual activity has changed since you were last screened, and you are at increased risk for chlamydia or gonorrhea. Ask your health care provider if you are at risk. Ask your health care provider about whether you are at high risk for HIV. Your health care provider may recommend a prescription medicine to help prevent HIV infection. If you choose to take medicine to prevent HIV, you should first get tested for HIV. You should then be tested every 3 months for as long as you are taking the medicine. Pregnancy If you are about to stop having your period (premenopausal) and you may become pregnant, seek counseling before you get pregnant. Take 400 to 800 micrograms (mcg) of folic acid every day if you become pregnant. Ask for birth control (contraception) if you want to prevent pregnancy. Osteoporosis and menopause Osteoporosis is a disease in which the bones lose minerals and strength with aging. This can result in bone fractures. If you are 36 years old or older, or if you are at risk for osteoporosis and fractures, ask your health care provider if you should: Be screened for bone loss. Take a calcium or vitamin D supplement to lower your risk of fractures. Be given hormone replacement therapy (HRT) to treat symptoms of menopause. Follow these instructions at home: Alcohol use Do not drink alcohol if: Your health care provider tells you not to drink. You are pregnant, may be pregnant, or are planning to become pregnant. If you drink alcohol: Limit how much you have to: 0-1 drink a day. Know how much alcohol is in your drink. In the [place] one drink equals one 12 oz bottle of beer (355 mL), one 5 oz glass of wine (148 mL), or one 1 oz glass of hard liquor (44 mL). Lifestyle Do not use any products that contain nicotine or tobacco. These products include cigarettes, chewing tobacco, and vaping devices, such as e-cigarettes. If you need help quitting, ask your health care  provider. Do not use street drugs. Do not share needles. Ask your health care provider for help if you need support or information about quitting drugs. General instructions Schedule regular health, dental, and eye exams. Stay current with your vaccines. Tell your health care provider if: You often feel depressed. You have ever been abused or do not feel safe at home. Summary Adopting a healthy lifestyle and getting preventive care are important in promoting health and wellness. Follow your health care provider's instructions about healthy diet, exercising, and getting tested or screened for diseases. Follow your health care provider's instructions on monitoring your cholesterol and blood pressure. This information is not intended to replace advice given to you by your health care provider. Make sure you discuss any questions you have with your health care provider. Document Revised: 03/31/2021 Document Reviewed: 03/31/2021 Elsevier Patient Education  2023 [employer].

## 2022-07-26 DIAGNOSIS — E782 Mixed hyperlipidemia: Secondary | ICD-10-CM | POA: Insufficient documentation

## 2022-07-26 DIAGNOSIS — R7303 Prediabetes: Secondary | ICD-10-CM | POA: Insufficient documentation

## 2022-08-29 LAB — COLOGUARD: COLOGUARD: NEGATIVE

## 2023-01-15 ENCOUNTER — Emergency Department (HOSPITAL_BASED_OUTPATIENT_CLINIC_OR_DEPARTMENT_OTHER): Payer: No Typology Code available for payment source

## 2023-01-15 ENCOUNTER — Encounter (HOSPITAL_COMMUNITY): Payer: Self-pay | Admitting: Emergency Medicine

## 2023-01-15 ENCOUNTER — Emergency Department (HOSPITAL_COMMUNITY)
Admission: EM | Admit: 2023-01-15 | Discharge: 2023-01-15 | Disposition: A | Discharge status: Home / Self Care | Payer: No Typology Code available for payment source | Attending: Emergency Medicine | Admitting: Emergency Medicine

## 2023-01-15 ENCOUNTER — Other Ambulatory Visit: Payer: Self-pay

## 2023-01-15 DIAGNOSIS — M7989 Other specified soft tissue disorders: Secondary | ICD-10-CM | POA: Diagnosis not present

## 2023-01-15 DIAGNOSIS — R600 Localized edema: Secondary | ICD-10-CM | POA: Diagnosis not present

## 2023-01-15 DIAGNOSIS — M79605 Pain in left leg: Secondary | ICD-10-CM | POA: Diagnosis present

## 2023-01-15 DIAGNOSIS — R609 Edema, unspecified: Secondary | ICD-10-CM

## 2023-01-15 DIAGNOSIS — M79662 Pain in left lower leg: Secondary | ICD-10-CM

## 2023-01-15 LAB — BASIC METABOLIC PANEL
Anion gap: 9 (ref 5–15)
BUN: 13 mg/dL (ref 6–20)
CO2: 24 mmol/L (ref 22–32)
Calcium: 9.1 mg/dL (ref 8.9–10.3)
Chloride: 105 mmol/L (ref 98–111)
Creatinine, Ser: 0.87 mg/dL (ref 0.44–1.00)
GFR, Estimated: >60 mL/min (ref >60)
Glucose, Bld: 91 mg/dL (ref 70–99)
Potassium: 3.8 mmol/L (ref 3.5–5.1)
Sodium: 138 mmol/L (ref 135–145)

## 2023-01-15 LAB — CK: Total CK: 122 U/L (ref 38–234)

## 2023-01-15 MED ORDER — NAPROXEN 500 MG PO TABS: 500.0000 mg | ORAL_TABLET | Freq: Two times a day (BID) | ORAL | 0 refills | Status: DC | PRN

## 2023-01-15 NOTE — ED Provider Notes (Signed)
[facility] Provider Note   CSN: [id number] Arrival date & time: 01/15/23  [id number]     History  Chief Complaint  Patient presents with   Leg Pain   calf pain    Shelly [name] is a 52 y.o. female.  Patient with 4-5-day history of posterior left leg pain and swelling.  Denies any fall or trauma.  Pain started behind her left knee but is progressed to her left calf.  No focal weakness.  Some intermittent numbness and tingling.  Her husband attempted to massage it without relief.  No history of blood clot.  Called her doctor was referred to the ED for ultrasound.  No chest pain or shortness of breath.  No nausea or vomiting.  No fever.  No history of exertional hormone use or previous blood clot.  No fever.  The history is provided by the patient.  Leg Pain Associated symptoms: no fever        Home Medications Prior to Admission medications   Medication Sig Start Date End Date Taking? Authorizing Provider  cetirizine (ZYRTEC) 10 MG tablet Take 10 mg by mouth daily.    [provider]  FLUoxetine (PROZAC) 20 MG capsule TAKE 1 CAPSULE BY MOUTH EVERY DAY 06/29/22   Martinique, Betty G, MD  Melatonin 10 MG CAPS Take 20 mg by mouth at bedtime as needed (sleep).    [provider]  omeprazole (PRILOSEC) 20 MG capsule Take 20 mg by mouth daily.    [provider]  OVER THE COUNTER MEDICATION Take 1 capsule by mouth at bedtime as needed (sleep). CBD    [provider]      Allergies    Patient has no known allergies.    Review of Systems   Review of Systems  Constitutional:  Negative for activity change, appetite change and fever.  HENT:  Negative for congestion.   Respiratory:  Negative for cough, chest tightness and shortness of breath.   Cardiovascular:  Positive for leg swelling. Negative for chest pain.  Gastrointestinal:  Negative for abdominal pain, nausea and vomiting.  Genitourinary:  Negative for  dysuria.  Musculoskeletal:  Positive for arthralgias and myalgias.  Skin:  Negative for rash.  Neurological:  Negative for dizziness, weakness and headaches.   all other systems are negative except as noted in the HPI and PMH.    Physical Exam Updated Vital Signs BP (!) 148/92 (BP Location: Right Arm)   Pulse 69   Temp 98.2 F (36.8 C) (Oral)   Resp 16   Ht '5\' 5"'$  (1.651 m)   Wt 92.5 kg   SpO2 98%   BMI 33.95 kg/m  Physical Exam Vitals and nursing note reviewed.  Constitutional:      General: She is not in acute distress.    Appearance: She is well-developed.  HENT:     Head: Normocephalic and atraumatic.     Mouth/Throat:     Pharynx: No oropharyngeal exudate.  Eyes:     Conjunctiva/sclera: Conjunctivae normal.     Pupils: Pupils are equal, round, and reactive to light.  Neck:     Comments: No meningismus. Cardiovascular:     Rate and Rhythm: Normal rate and regular rhythm.     Heart sounds: Normal heart sounds. No murmur heard. Pulmonary:     Effort: Pulmonary effort is normal. No respiratory distress.     Breath sounds: Normal breath sounds.  Abdominal:     Palpations: Abdomen  is soft.     Tenderness: There is no abdominal tenderness. There is no guarding or rebound.  Musculoskeletal:        General: Tenderness present. Normal range of motion.     Cervical back: Normal range of motion and neck supple.     Left lower leg: Edema present.     Comments: Minimal swelling to left leg compared to right.  Multiple varicose veins.  Intact DP and PT pulse.  Compartments soft.  No significant asymmetry.  Skin:    General: Skin is warm.  Neurological:     Mental Status: She is alert and oriented to person, place, and time.     Cranial Nerves: No cranial nerve deficit.     Motor: No abnormal muscle tone.     Coordination: Coordination normal.     Comments:  5/5 strength throughout. CN 2-12 intact.Equal grip strength.   Psychiatric:        Behavior: Behavior normal.      ED Results / Procedures / Treatments   Labs (all labs ordered are listed, but only abnormal results are displayed) Labs Reviewed  BASIC METABOLIC PANEL  CK    EKG None  Radiology VAS [place] LOWER EXTREMITY VENOUS (DVT) (ONLY [facility])  Result Date: 01/15/2023  Lower Venous DVT Study Patient Name:  Shelly [name]  Date of Exam:   01/15/2023 Medical Rec #: [id number]          Accession #:    [id number] Date of Birth: 08-10-1971          Patient Gender: F Patient Age:   27 years Exam Location[facility]  [facility] Procedure:      VAS [place] LOWER EXTREMITY VENOUS (DVT) Referring Phys: Ezequiel Essex --------------------------------------------------------------------------------  Indications: Edema, and Swelling.  Comparison Study: no prior Performing Technologist: Archie Patten RVS  Examination Guidelines: A complete evaluation includes B-mode imaging, spectral Doppler, color Doppler, and power Doppler as needed of all accessible portions of each vessel. Bilateral testing is considered an integral part of a complete examination. Limited examinations for reoccurring indications may be performed as noted. The reflux portion of the exam is performed with the patient in reverse Trendelenburg.  +-----+---------------+---------+-----------+----------+--------------+ RIGHTCompressibilityPhasicitySpontaneityPropertiesThrombus Aging +-----+---------------+---------+-----------+----------+--------------+ CFV  Full           Yes      Yes                                 +-----+---------------+---------+-----------+----------+--------------+   +---------+---------------+---------+-----------+----------+--------------+ LEFT     CompressibilityPhasicitySpontaneityPropertiesThrombus Aging +---------+---------------+---------+-----------+----------+--------------+ CFV      Full           Yes      Yes                                  +---------+---------------+---------+-----------+----------+--------------+ SFJ      Full                                                        +---------+---------------+---------+-----------+----------+--------------+ FV Prox  Full                                                        +---------+---------------+---------+-----------+----------+--------------+  FV Mid   Full                                                        +---------+---------------+---------+-----------+----------+--------------+ FV DistalFull                                                        +---------+---------------+---------+-----------+----------+--------------+ PFV      Full                                                        +---------+---------------+---------+-----------+----------+--------------+ POP      Full           Yes      Yes                                 +---------+---------------+---------+-----------+----------+--------------+ PTV      Full                                                        +---------+---------------+---------+-----------+----------+--------------+ PERO     Full           Yes      Yes                                 +---------+---------------+---------+-----------+----------+--------------+     Summary: RIGHT: - No evidence of common femoral vein obstruction.  LEFT: - There is no evidence of deep vein thrombosis in the lower extremity.  - No cystic structure found in the popliteal fossa. - Non-vascularized cystic structure measuring 5.67 cm x 1.7 cm noted in the mid calf. Etiology unknown.  *See table(s) above for measurements and observations.    Preliminary     Procedures Procedures    Medications Ordered in ED Medications - No data to display  ED Course/ Medical Decision Making/ A&P                             Medical Decision Making Amount and/or Complexity of Data Reviewed Labs: ordered. Decision-making details  documented in ED Course. Radiology: ordered and independent interpretation performed. Decision-making details documented in ED Course. ECG/medicine tests: ordered and independent interpretation performed. Decision-making details documented in ED Course.  Risk Prescription drug management.   Left leg pain for the past 4 to 5 days.  Neurovascularly intact.  Will obtain Doppler to rule out DVT.  Ultrasound shows no evidence of DVT in the left lower extremity.  There is a cystic structure in the mid calf of undetermined etiology.  This may be source of her pain.  Electrolytes are assuring.  Potassium is normal, CK is normal.  Etiology of cystic structure  in patient's calf.  Will refer to orthopedics for consideration of MRI as next step.  Does not appear to be vascular in origin.  No evidence of DVT.  Recommend ice, elevation, NSAID medications and PCP and orthopedic follow-up.        Final Clinical Impression(s) / ED Diagnoses Final diagnoses:  Pain of left calf    Rx / DC Orders ED Discharge Orders     None         Tashema Tiller, Annie Main, MD 01/15/23 1259

## 2023-01-15 NOTE — ED Notes (Signed)
AVS reviewed with pt prior to discharge. Pt verbalizes understanding. Belongings with pt upon depart. Ambulatory to POV by self

## 2023-01-15 NOTE — ED Notes (Signed)
Denies pain, states her L calf feels tight when standing or stretching the muscle.  No weakness.  Minimal nonpitting edema noted.

## 2023-01-15 NOTE — ED Triage Notes (Signed)
Pt. Stated, I started having left lower calf pain 5 days ago and it continues in pain and then it started swelling. My Dr. Rockey Situ me to come here.

## 2023-01-15 NOTE — Discharge Instructions (Addendum)
Your testing is negative for blood clot but does show a cystic structure in your left calf of undetermined etiology.  You should follow-up with your primary doctor as well as orthopedic doctor for consideration of an MRI for further evaluation.  Keep leg elevated.  Take anti-inflammatories as prescribed.  Return to the ED if worsening pain, weakness, numbness, tingling, other concerns

## 2023-01-15 NOTE — Progress Notes (Signed)
Lower extremity venous duplex has been completed.   Preliminary results in CV Proc.   Jinny Blossom Bunnie Lederman 01/15/2023 11:35 AM

## 2023-01-25 ENCOUNTER — Other Ambulatory Visit: Payer: Self-pay | Admitting: Family Medicine

## 2023-01-25 DIAGNOSIS — F3341 Major depressive disorder, recurrent, in partial remission: Secondary | ICD-10-CM

## 2023-01-25 DIAGNOSIS — F419 Anxiety disorder, unspecified: Secondary | ICD-10-CM

## 2023-02-03 ENCOUNTER — Other Ambulatory Visit (HOSPITAL_BASED_OUTPATIENT_CLINIC_OR_DEPARTMENT_OTHER): Payer: Self-pay | Admitting: Orthopaedic Surgery

## 2023-02-03 ENCOUNTER — Ambulatory Visit (HOSPITAL_BASED_OUTPATIENT_CLINIC_OR_DEPARTMENT_OTHER): Payer: No Typology Code available for payment source | Admitting: Orthopaedic Surgery

## 2023-02-03 DIAGNOSIS — M25562 Pain in left knee: Secondary | ICD-10-CM

## 2023-02-03 DIAGNOSIS — R2242 Localized swelling, mass and lump, left lower limb: Secondary | ICD-10-CM

## 2023-02-03 DIAGNOSIS — M7122 Synovial cyst of popliteal space [Baker], left knee: Secondary | ICD-10-CM

## 2023-02-03 NOTE — Addendum Note (Signed)
Addended by: Raynelle Fanning A on: 02/03/2023 10:06 AM   Modules accepted: Orders

## 2023-02-03 NOTE — Progress Notes (Signed)
Chief Complaint: Left calf pain     History of Present Illness:    Shelly [name] is a 52 y.o. female presents today with ongoing left calf pain and swelling that has been going on atraumatically for the last 1 and half months.  She experiences the pain as a cramp at night.  She did initially present to the emergency room where an ultrasound ruled out a DVT.  Unfortunately she is still having progressive swelling and pain.  She is on her feet all day and definitely noticed symptoms more at night.    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 08/2021   Carpal tunnel syndrome    Right   Depression    GERD (gastroesophageal reflux disease)    Intermittent palpitations    Loose body of right knee    Migraines    Osteochondral defect    right knee   Past Surgical History:  Procedure Laterality Date   CESAREAN SECTION  06-03-2011//  09-27-2003//  2000   Bilateral Tubal Ligation w/ last c/s   CHONDROPLASTY Right 06/11/2015   Procedure: CHONDROPLASTY;  Surgeon: Sydnee Cabal, MD;  Location: [facility];  Service: Orthopedics;  Laterality: Right;   DILATION AND CURETTAGE OF UTERUS     FOREIGN BODY REMOVAL Right 06/11/2015   Procedure: RIGHT REMOVAL LOOSE BODY;  Surgeon: Sydnee Cabal, MD;  Location: [facility];  Service: Orthopedics;  Laterality: Right;   KNEE ARTHROSCOPY Right 11/23/1988   KNEE ARTHROSCOPY Right 06/11/2015   Procedure: RIGHT ARTHROSCOPY KNEE;  Surgeon: Sydnee Cabal, MD;  Location: [facility];  Service: Orthopedics;  Laterality: Right;   LEFT WRIST REDUCTION AND FUSION   07/19/2008   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   PARTIAL KNEE ARTHROPLASTY Right 09/02/2015    Procedure: RIGHT UNI KNEE ARTHROPLASTY MEDIALLY;  Surgeon: Paralee Cancel, MD;  Location: [facility];  Service: Orthopedics;  Laterality: Right;   TOTAL KNEE REVISION Right 03/20/2016   Procedure: RIGHT UNI KNEE POLY EXCHANGE;  Surgeon: Paralee Cancel, MD;  Location: [facility];  Service: Orthopedics;  Laterality: Right;   TOTAL KNEE REVISION WITH SCAR DEBRIDEMENT/PATELLA REVISION WITH POLY EXCHANGE Right 11/11/2021   Procedure: Right unicompartmental knee arthroplasty poly revision;  Surgeon: Paralee Cancel, MD;  Location: [facility];  Service: Orthopedics;  Laterality: Right;   TUBAL LIGATION     WISDOM TOOTH EXTRACTION Bilateral    Social History   Socioeconomic History   Marital status: Married    Spouse name: Not on file   Number of children: Not on file   Years of education: Not on file   Highest education level: Not on file  Occupational History   Not on file  Tobacco Use   Smoking status: Former    Years: 6.00    Types: Cigarettes    Quit date: 06/10/1995    Years since quitting: 27.6   Smokeless tobacco: Never  Vaping Use   Vaping Use: Never used  Substance and Sexual Activity   Alcohol use: Yes    Alcohol/week: 0.0 standard drinks of alcohol  Comment: occasional   Drug use: No   Sexual activity: Yes    Partners: Male    Birth control/protection: Surgical  Other Topics Concern   Not on file  Social History Narrative   Not on file   Social Determinants of Health   Financial Resource Strain: Not on file  Food Insecurity: Not on file  Transportation Needs: Not on file  Physical Activity: Not on file  Stress: Not on file  Social Connections: Not on file   Family History  Problem Relation Age of Onset   COPD Father    Emphysema Father    Arthritis Mother    Alcohol abuse Paternal Grandmother    Cancer Paternal Grandmother    No Known Allergies Current Outpatient Medications  Medication Sig Dispense Refill   cetirizine (ZYRTEC) 10 MG tablet Take 10 mg by mouth daily.      FLUoxetine (PROZAC) 20 MG capsule TAKE 1 CAPSULE EVERY DAY 90 capsule 0   Melatonin 10 MG CAPS Take 20 mg by mouth at bedtime as needed (sleep).     naproxen (NAPROSYN) 500 MG tablet Take 1 tablet (500 mg total) by mouth 2 (two) times daily as needed. 30 tablet 0   omeprazole (PRILOSEC) 20 MG capsule Take 20 mg by mouth daily.     OVER THE COUNTER MEDICATION Take 1 capsule by mouth at bedtime as needed (sleep). CBD     No current facility-administered medications for this visit.   No results found.  Review of Systems:   A ROS was performed including pertinent positives and negatives as documented in the HPI.  Physical Exam :   Constitutional: NAD and appears stated age Neurological: Alert and oriented Psych: Appropriate affect and cooperative There were no vitals taken for this visit.   Comprehensive Musculoskeletal Exam:    Left lower extremity is swollen compared to the contralateral side.  There is redness.  There is tenderness about the calf with compression with positive Homan examination.  There is tenderness tricompartmentally about the knee that does have full range of motion with swelling.  Imaging:     I personally reviewed and interpreted the radiographs.   Assessment:   52 y.o. female with persistent left calf pain and swelling.  I have described that given the fact that there was a cystic mass seen on ultrasound that I would recommend an MRI of this left leg that we can rule out any type of underlying lesion.  I would like to obtain this in an urgent fashion.  I did also describe that I would like to perform a repeat ultrasound as we did discuss the possibility of a negative previous examination in the setting of progressively worsening pain and swelling.  Will plan to proceed with both of these through [facility].  I will see her back following to discuss results.  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  Plan :    -Return to clinic following MRI ultrasound     I personally saw and evaluated the patient, and participated in the management and treatment plan.  Vanetta Mulders, MD Attending Physician, Orthopedic Surgery  This document was dictated using Dragon voice recognition software. A reasonable attempt at proof reading has been made to minimize errors.

## 2023-02-08 ENCOUNTER — Ambulatory Visit
Admission: RE | Admit: 2023-02-08 | Discharge: 2023-02-08 | Disposition: A | Discharge status: Home / Self Care | Payer: No Typology Code available for payment source | Source: Ambulatory Visit | Attending: Orthopaedic Surgery | Admitting: Orthopaedic Surgery

## 2023-02-08 ENCOUNTER — Encounter (HOSPITAL_BASED_OUTPATIENT_CLINIC_OR_DEPARTMENT_OTHER): Payer: Self-pay | Admitting: Orthopaedic Surgery

## 2023-02-08 DIAGNOSIS — R2242 Localized swelling, mass and lump, left lower limb: Secondary | ICD-10-CM

## 2023-02-08 MED ORDER — GADOPICLENOL 0.5 MMOL/ML IV SOLN
10.0000 mL | Freq: Once | INTRAVENOUS | Status: AC | PRN
  Administered 2023-02-08: 10 mL via INTRAVENOUS

## 2023-02-17 ENCOUNTER — Ambulatory Visit (HOSPITAL_BASED_OUTPATIENT_CLINIC_OR_DEPARTMENT_OTHER): Payer: No Typology Code available for payment source | Admitting: Orthopaedic Surgery

## 2023-02-17 ENCOUNTER — Ambulatory Visit (HOSPITAL_BASED_OUTPATIENT_CLINIC_OR_DEPARTMENT_OTHER): Payer: No Typology Code available for payment source

## 2023-02-17 DIAGNOSIS — M25562 Pain in left knee: Secondary | ICD-10-CM | POA: Diagnosis not present

## 2023-02-17 NOTE — Progress Notes (Signed)
Chief Complaint: Left calf pain     History of Present Illness:   02/17/2023: Presents today for follow-up of her left leg MRI.  She is here today for further assessment.  She is still having persistent pain about the medial aspect of the knee.  Shelly [name] is a 52 y.o. female presents today with ongoing left calf pain and swelling that has been going on atraumatically for the last 1 and half months.  She experiences the pain as a cramp at night.  She did initially present to the emergency room where an ultrasound ruled out a DVT.  Unfortunately she is still having progressive swelling and pain.  She is on her feet all day and definitely noticed symptoms more at night.    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 08/2021   Carpal tunnel syndrome    Right   Depression    GERD (gastroesophageal reflux disease)    Intermittent palpitations    Loose body of right knee    Migraines    Osteochondral defect    right knee   Past Surgical History:  Procedure Laterality Date   CESAREAN SECTION  06-03-2011//  09-27-2003//  2000   Bilateral Tubal Ligation w/ last c/s   CHONDROPLASTY Right 06/11/2015   Procedure: CHONDROPLASTY;  Surgeon: Sydnee Cabal, MD;  Location: [facility];  Service: Orthopedics;  Laterality: Right;   DILATION AND CURETTAGE OF UTERUS     FOREIGN BODY REMOVAL Right 06/11/2015   Procedure: RIGHT REMOVAL LOOSE BODY;  Surgeon: Sydnee Cabal, MD;  Location: [facility];  Service: Orthopedics;  Laterality: Right;   KNEE ARTHROSCOPY Right 11/23/1988   KNEE ARTHROSCOPY Right 06/11/2015   Procedure: RIGHT ARTHROSCOPY KNEE;  Surgeon: Sydnee Cabal, MD;  Location: [facility];  Service: Orthopedics;  Laterality: Right;   LEFT WRIST REDUCTION AND FUSION   07/19/2008   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   PARTIAL KNEE ARTHROPLASTY Right 09/02/2015   Procedure: RIGHT UNI KNEE ARTHROPLASTY MEDIALLY;  Surgeon: Paralee Cancel, MD;  Location: [facility];  Service: Orthopedics;  Laterality: Right;   TOTAL KNEE REVISION Right 03/20/2016   Procedure: RIGHT UNI KNEE POLY EXCHANGE;  Surgeon: Paralee Cancel, MD;  Location: [facility];  Service: Orthopedics;  Laterality: Right;   TOTAL KNEE REVISION WITH SCAR DEBRIDEMENT/PATELLA REVISION WITH POLY EXCHANGE Right 11/11/2021   Procedure: Right unicompartmental knee arthroplasty poly revision;  Surgeon: Paralee Cancel, MD;  Location: [facility];  Service: Orthopedics;  Laterality: Right;   TUBAL LIGATION     WISDOM TOOTH EXTRACTION Bilateral    Social History   Socioeconomic History   Marital status: Married    Spouse name: Not on file   Number of children: Not on file   Years of education: Not on file   Highest education level: Not on file  Occupational History   Not on file  Tobacco Use   Smoking status: Former    Years: 6    Types: Cigarettes    Quit date: 06/10/1995    Years since quitting: 109.7  Smokeless tobacco: Never  Vaping Use   Vaping Use: Never used  Substance and Sexual Activity   Alcohol use: Yes    Alcohol/week: 0.0 standard drinks of alcohol    Comment: occasional   Drug use: No   Sexual activity: Yes    Partners: Male    Birth control/protection: Surgical  Other Topics Concern   Not on file  Social History Narrative   Not on file   Social Determinants of Health   Financial Resource Strain: Not on file  Food Insecurity: Not on file  Transportation Needs: Not on file  Physical Activity: Not on file  Stress: Not on file  Social Connections: Not on file   Family History  Problem Relation Age of Onset   COPD Father    Emphysema Father    Arthritis Mother    Alcohol abuse Paternal Grandmother     Cancer Paternal Grandmother    No Known Allergies Current Outpatient Medications  Medication Sig Dispense Refill   cetirizine (ZYRTEC) 10 MG tablet Take 10 mg by mouth daily.     FLUoxetine (PROZAC) 20 MG capsule TAKE 1 CAPSULE EVERY DAY 90 capsule 0   Melatonin 10 MG CAPS Take 20 mg by mouth at bedtime as needed (sleep).     naproxen (NAPROSYN) 500 MG tablet Take 1 tablet (500 mg total) by mouth 2 (two) times daily as needed. 30 tablet 0   omeprazole (PRILOSEC) 20 MG capsule Take 20 mg by mouth daily.     OVER THE COUNTER MEDICATION Take 1 capsule by mouth at bedtime as needed (sleep). CBD     No current facility-administered medications for this visit.   No results found.  Review of Systems:   A ROS was performed including pertinent positives and negatives as documented in the HPI.  Physical Exam :   Constitutional: NAD and appears stated age Neurological: Alert and oriented Psych: Appropriate affect and cooperative There were no vitals taken for this visit.   Comprehensive Musculoskeletal Exam:    Left lower extremity is swollen compared to the contralateral side.  Tenderness about the left knee medial joint without tenderness about the lateral joint patellofemoral.  Range of motion of the left knee is from -3 to 130 degrees.  Effusion about the knee which is mild.  There is tenderness about the calf with compression with positive Homan examination.  There is tenderness tricompartmentally about the knee that does have full range of motion with swelling.  Imaging:    X-rays 4 views left knee: Isolated medial compartment osteoarthritis  MRI left knee and left tibia: There is a large Baker's cyst which is decompressed into the calf in the setting of medial meniscal extrusion consistent with a previous root tear and progressive osteoarthritis  I personally reviewed and interpreted the radiographs.   Assessment:   52 y.o. female with persistent left calf pain and swelling in  the setting of a large Baker's cyst which is decompressed from the knee.  Overall today I did describe the core etiology of her knee pain is isolated medial osteoarthritis from likely previous meniscal root tear.  At today's visit I have advised that I do believe that treating this would help with the Baker's cyst and ultimate fluid collection in the back of the calf.  Side effects given the fact that the remainder of the knee is relatively unaffected I do believe she would benefit from a partial knee replacement.  She has had previously 3 injections into the left  knee without significant relief.  She is very active and very young.  To this effect I do believe that she ultimately would benefit from a left partial knee arthroplasty.  Will plan to refer her to Dr. Clayborne Dana for assessment of this  Plan :    -Plan for referral for left knee partial knee replacement     I personally saw and evaluated the patient, and participated in the management and treatment plan.  Vanetta Mulders, MD Attending Physician, Orthopedic Surgery  This document was dictated using Dragon voice recognition software. A reasonable attempt at proof reading has been made to minimize errors.

## 2023-02-24 ENCOUNTER — Ambulatory Visit
Admission: RE | Admit: 2023-02-24 | Discharge: 2023-02-24 | Disposition: A | Discharge status: Home / Self Care | Payer: No Typology Code available for payment source | Source: Ambulatory Visit | Attending: Orthopaedic Surgery | Admitting: Orthopaedic Surgery

## 2023-02-24 DIAGNOSIS — M7122 Synovial cyst of popliteal space [Baker], left knee: Secondary | ICD-10-CM

## 2023-03-09 NOTE — Progress Notes (Signed)
Chief Complaint  Patient presents with   Pre-op Exam   HPI: Shelly [name] is a 52 y.o. female with past medical history significant for depression/anxiety, insomnia, osteoarthritis, GERD, hyperlipidemia, and asthma here today for a pre-op exam requested by Dr. Blanchie Dessert at [facility]. She is scheduled partial left knee replacement surgery.  Date to be determined but possibly May or June 2024. Spinal anesthesia.  She reports worsening knee pain, has had  x-rays and knee MRI.   She has had surgeries in the past, the most recent was right knee surgery in 05/2015 and 10/2021. No complications during or after procedure and no adverse effects from anesthesia. She denies any chest pain, dyspnea, palpitation, diaphoresis,or dizziness/syncope  when climbing a flight of stairs, carrying heavy groceries,or walking up a hill.  She has been experiencing severe pruritus on pretibial area of LLE for a month. No new medication, insect bites, new soaps or skin products. No sick contact. She has not tried OTC treatments. It has improved. No hx of eczema but has hx of seasonal allergies. Pruritic and watery eyes.  She reports significant fatigue, with eyes getting so tired that if closed, she feels like she "will pass out." This has been an issue while driving, as she cannot drive for more than 16-10 minutes without starting to feel sleepy. She does not feel rested in the morning and often feels "hangover", even after sleeping for eight hours.  The fatigue has been ongoing for two months and has been gradual.  She snores and has woken up in the middle of the night feeling like she cannot breath but it has not happened in years.  Mild anemia in the past. She denies pica like symptoms. Lab Results  Component Value Date   WBC 6.3 11/11/2021   HGB 11.9 (L) 11/11/2021   HCT 37.5 11/11/2021   MCV 88.2 11/11/2021   PLT 290 11/11/2021   Lab Results  Component Value Date   TSH  1.147 05/07/2015   Lab Results  Component Value Date   CREATININE 0.87 01/15/2023   BUN 13 01/15/2023   NA 138 01/15/2023   K 3.8 01/15/2023   CL 105 01/15/2023   CO2 24 01/15/2023   Review of Systems  Constitutional:  Negative for activity change, appetite change, chills and fever.  HENT:  Negative for mouth sores and sore throat.   Eyes:  Negative for pain and visual disturbance.  Respiratory:  Negative for cough and wheezing.   Gastrointestinal:  Negative for abdominal pain, nausea and vomiting.  Endocrine: Negative for cold intolerance, heat intolerance, polydipsia, polyphagia and polyuria.  Genitourinary:  Negative for decreased urine volume, dysuria and hematuria.  Skin:  Negative for rash.  Allergic/Immunologic: Positive for environmental allergies.  Neurological:  Negative for facial asymmetry, weakness and headaches.  Psychiatric/Behavioral:  Negative for confusion and hallucinations.   See other pertinent positives and negatives in HPI.  Current Outpatient Medications on File Prior to Visit  Medication Sig Dispense Refill   cetirizine (ZYRTEC) 10 MG tablet Take 10 mg by mouth daily.     FLUoxetine (PROZAC) 20 MG capsule TAKE 1 CAPSULE EVERY DAY 90 capsule 0   omeprazole (PRILOSEC) 20 MG capsule Take 20 mg by mouth daily.     OVER THE COUNTER MEDICATION Take 1 capsule by mouth at bedtime as needed (sleep). CBD     No current facility-administered medications on file prior to visit.   Past Medical History:  Diagnosis Date   Anemia  borderline   Anxiety    Arthritis    Asthma    Benign paroxysmal positional vertigo due to bilateral vestibular disorder 08/2021   Carpal tunnel syndrome    Right   Depression    GERD (gastroesophageal reflux disease)    Intermittent palpitations    Loose body of right knee    Migraines    Osteochondral defect    right knee   No Known Allergies  Social History   Socioeconomic History   Marital status: Married    Spouse  name: Not on file   Number of children: Not on file   Years of education: Not on file   Highest education level: Not on file  Occupational History   Not on file  Tobacco Use   Smoking status: Former    Years: 6    Types: Cigarettes    Quit date: 06/10/1995    Years since quitting: 27.7   Smokeless tobacco: Never  Vaping Use   Vaping Use: Never used  Substance and Sexual Activity   Alcohol use: Yes    Alcohol/week: 0.0 standard drinks of alcohol    Comment: occasional   Drug use: No   Sexual activity: Yes    Partners: Male    Birth control/protection: Surgical  Other Topics Concern   Not on file  Social History Narrative   Not on file   Social Determinants of Health   Financial Resource Strain: Patient Declined (03/09/2023)   Overall Financial Resource Strain (CARDIA)    Difficulty of Paying Living Expenses: Patient declined  Food Insecurity: Patient Declined (03/09/2023)   Hunger Vital Sign    Worried About Running Out of Food in the Last Year: Patient declined    Ran Out of Food in the Last Year: Patient declined  Transportation Needs: Patient Declined (03/09/2023)   PRAPARE - Administrator, Civil Service (Medical): Patient declined    Lack of Transportation (Non-Medical): Patient declined  Physical Activity: Insufficiently Active (03/09/2023)   Exercise Vital Sign    Days of Exercise per Week: 2 days    Minutes of Exercise per Session: 40 min  Stress: Stress Concern Present (03/09/2023)   [employer] of Occupational Health - Occupational Stress Questionnaire    Feeling of Stress : Very much  Social Connections: Unknown (03/09/2023)   Social Connection and Isolation Panel [NHANES]    Frequency of Communication with Friends and Family: Patient declined    Frequency of Social Gatherings with Friends and Family: Patient declined    Attends Religious Services: Patient declined    Database administrator or Organizations: Patient declined    Attends Tax inspector Meetings: Not on file    Marital Status: Patient declined   Vitals:   03/10/23 1555  BP: 120/70  Pulse: 94  Resp: 12  SpO2: 94%   Body mass index is 35.28 kg/m.  Physical Exam Vitals and nursing note reviewed.  Constitutional:      General: She is not in acute distress.    Appearance: She is well-developed.  HENT:     Head: Normocephalic and atraumatic.     Mouth/Throat:     Mouth: Mucous membranes are moist.     Pharynx: Oropharynx is clear.  Eyes:     Conjunctiva/sclera: Conjunctivae normal.  Cardiovascular:     Rate and Rhythm: Normal rate and regular rhythm.     Pulses:          Dorsalis pedis pulses are 2+ on  the right side and 2+ on the left side.     Heart sounds: No murmur heard. Pulmonary:     Effort: Pulmonary effort is normal. No respiratory distress.     Breath sounds: Normal breath sounds.  Abdominal:     Palpations: Abdomen is soft. There is no hepatomegaly or mass.     Tenderness: There is no abdominal tenderness.  Lymphadenopathy:     Cervical: No cervical adenopathy.  Skin:    General: Skin is warm.     Findings: Rash present. No erythema.       Neurological:     General: No focal deficit present.     Mental Status: She is alert and oriented to person, place, and time.     Cranial Nerves: No cranial nerve deficit.     Gait: Gait normal.  Psychiatric:        Mood and Affect: Mood and affect normal.   ASSESSMENT AND PLAN:  Ms.Jamira was seen today for pre-op exam.  Diagnoses and all orders for this visit:  Pre-op exam Planning on undergoing left partial knee replacement. Low risk based on her CV risk factors. DVT prevention: Early ambulation and oral anticoagulation or antiplatelet therapy. She prefers to comeback for blood work. EKG today: NSR,normal axis and intervals.No other EKG available for comparison.  Instructed to avoid NSAID's, aspirin,and supplements for at least a week before surgery. She can take her medication  the day before and after procedure when she is allowed to start oral intake. Pre-op form will be completed and faxed to surgeon.  -     CBC; Future -     Comprehensive metabolic panel; Future  Daytime hypersomnolence We discussed possible etiologies. Sleep study will be arranged. Further recommendations according to lab results.  -     CBC; Future -     TSH; Future -     Comprehensive metabolic panel; Future -     Ambulatory referral to Sleep Studies  Dermatitis ? Stasis dermatitis. Recommend topical triamcinolone cream , small amount bid for 14 days then as needed.  -     triamcinolone cream (KENALOG) 0.1 %; Apply 1 Application topically 2 (two) times daily as needed.  Return if symptoms worsen or fail to improve, for keep next appointment.  Amalio Loe G. [place], MD  [facility]. [place] office.

## 2023-03-10 ENCOUNTER — Encounter: Payer: Self-pay | Admitting: Family Medicine

## 2023-03-10 ENCOUNTER — Ambulatory Visit: Payer: No Typology Code available for payment source | Admitting: Family Medicine

## 2023-03-10 VITALS — BP 120/70 | HR 94 | Resp 12 | Ht 65.0 in | Wt 212.0 lb

## 2023-03-10 DIAGNOSIS — G4719 Other hypersomnia: Secondary | ICD-10-CM

## 2023-03-10 DIAGNOSIS — L309 Dermatitis, unspecified: Secondary | ICD-10-CM

## 2023-03-10 DIAGNOSIS — Z01818 Encounter for other preprocedural examination: Secondary | ICD-10-CM | POA: Diagnosis not present

## 2023-03-10 MED ORDER — TRIAMCINOLONE ACETONIDE 0.1 % EX CREA
1.0000 | TOPICAL_CREAM | Freq: Two times a day (BID) | CUTANEOUS | 1 refills | Status: DC | PRN
Start: 2023-03-10 — End: 2023-05-31

## 2023-03-10 NOTE — Patient Instructions (Signed)
A few things to remember from today's visit:  Pre-op exam - Plan: CBC, Comprehensive metabolic panel  Daytime hypersomnolence - Plan: CBC, TSH, Comprehensive metabolic panel, Ambulatory referral to Sleep Studies  Dermatitis - Plan: triamcinolone cream (KENALOG) 0.1 %  Triamcinolone ,small amount , 2 times daily as needed.  If you need refills for medications you take chronically, please call your pharmacy. Do not use My Chart to request refills or for acute issues that need immediate attention. If you send a my chart message, it may take a few days to be addressed, specially if I am not in the office.  Please be sure medication list is accurate. If a new problem present, please set up appointment sooner than planned today.

## 2023-03-17 ENCOUNTER — Other Ambulatory Visit (INDEPENDENT_AMBULATORY_CARE_PROVIDER_SITE_OTHER): Payer: No Typology Code available for payment source

## 2023-03-17 DIAGNOSIS — Z01818 Encounter for other preprocedural examination: Secondary | ICD-10-CM | POA: Diagnosis not present

## 2023-03-17 DIAGNOSIS — G4719 Other hypersomnia: Secondary | ICD-10-CM | POA: Diagnosis not present

## 2023-03-18 LAB — COMPREHENSIVE METABOLIC PANEL
ALT: 15 U/L (ref 0–35)
AST: 17 U/L (ref 0–37)
Albumin: 4.2 g/dL (ref 3.5–5.2)
Alkaline Phosphatase: 65 U/L (ref 39–117)
BUN: 16 mg/dL (ref 6–23)
CO2: 30 mEq/L (ref 19–32)
Calcium: 9.7 mg/dL (ref 8.4–10.5)
Chloride: 104 mEq/L (ref 96–112)
Creatinine, Ser: 1.04 mg/dL (ref 0.40–1.20)
GFR: 62.35 mL/min (ref >60.00)
Glucose, Bld: 89 mg/dL (ref 70–99)
Potassium: 5.2 mEq/L — ABNORMAL HIGH (ref 3.5–5.1)
Sodium: 140 mEq/L (ref 135–145)
Total Bilirubin: 0.2 mg/dL (ref 0.2–1.2)
Total Protein: 7.2 g/dL (ref 6.0–8.3)

## 2023-03-18 LAB — CBC
HCT: 39.3 % (ref 36.0–46.0)
Hemoglobin: 13.1 g/dL (ref 12.0–15.0)
MCHC: 33.3 g/dL (ref 30.0–36.0)
MCV: 89 fl (ref 78.0–100.0)
Platelets: 268 10*3/uL (ref 150.0–400.0)
RBC: 4.42 Mil/uL (ref 3.87–5.11)
RDW: 14 % (ref 11.5–15.5)
WBC: 7.9 10*3/uL (ref 4.0–10.5)

## 2023-03-18 LAB — TSH: TSH: 1.13 u[IU]/mL (ref 0.35–5.50)

## 2023-03-24 ENCOUNTER — Other Ambulatory Visit: Payer: Self-pay

## 2023-03-24 DIAGNOSIS — E875 Hyperkalemia: Secondary | ICD-10-CM

## 2023-04-02 ENCOUNTER — Other Ambulatory Visit: Payer: No Typology Code available for payment source

## 2023-04-02 DIAGNOSIS — E875 Hyperkalemia: Secondary | ICD-10-CM

## 2023-04-02 NOTE — Addendum Note (Signed)
Addended by: Sumner Boast on: 04/02/2023 03:44 PM   Modules accepted: Orders

## 2023-04-03 LAB — POTASSIUM: Potassium: 3.9 mmol/L (ref 3.5–5.3)

## 2023-04-20 ENCOUNTER — Ambulatory Visit (HOSPITAL_COMMUNITY): Payer: Self-pay | Admitting: Emergency Medicine

## 2023-04-20 DIAGNOSIS — G8929 Other chronic pain: Secondary | ICD-10-CM

## 2023-04-20 NOTE — H&P (Signed)
TOTAL KNEE ADMISSION H&P  Patient is being admitted for left total knee arthroplasty.  Subjective:  Chief Complaint:left knee pain.  HPI: Shelly [name], 51 y.o. female, has a history of pain and functional disability in the left knee due to arthritis and has failed non-surgical conservative treatments for greater than 12 weeks to includeNSAID's and/or analgesics, corticosteriod injections, and activity modification.  Onset of symptoms was gradual, starting 1 years ago with gradually worsening course since that time. The patient noted prior procedures on the knee to include  unicompartmental arthroplasty on the right knee(s).  Patient currently rates pain in the left knee(s) at 8 out of 10 with activity. Patient has night pain, worsening of pain with activity and weight bearing, pain that interferes with activities of daily living, and pain with passive range of motion.  Patient has evidence of periarticular osteophytes and joint space narrowing by imaging studies.  There is no active infection.  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   Knee injury 03/19/2016   S/P right UKR 09/02/2015   Status post unilateral knee replacement 09/02/2015   S/P right knee arthroscopy 06/11/2015   Endometrial polyp 10/11/2013   ALLERGIC RHINITIS 11/14/2009   WRIST PAIN, LEFT 10/13/2007   ASTHMA 08/20/2007   GERD 08/20/2007   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 08/2021   Carpal tunnel syndrome    Right   Depression    GERD (gastroesophageal reflux disease)    Intermittent palpitations    Loose body of right knee     Migraines    Osteochondral defect    right knee    Past Surgical History:  Procedure Laterality Date   CESAREAN SECTION  06-03-2011//  09-27-2003//  2000   Bilateral Tubal Ligation w/ last c/s   CHONDROPLASTY Right 06/11/2015   Procedure: CHONDROPLASTY;  Surgeon: Eugenia Mcalpine, MD;  Location: [facility];  Service: Orthopedics;  Laterality: Right;   DILATION AND CURETTAGE OF UTERUS     FOREIGN BODY REMOVAL Right 06/11/2015   Procedure: RIGHT REMOVAL LOOSE BODY;  Surgeon: Eugenia Mcalpine, MD;  Location: [facility];  Service: Orthopedics;  Laterality: Right;   KNEE ARTHROSCOPY Right 11/23/1988   KNEE ARTHROSCOPY Right 06/11/2015   Procedure: RIGHT ARTHROSCOPY KNEE;  Surgeon: Eugenia Mcalpine, MD;  Location: [facility];  Service: Orthopedics;  Laterality: Right;   LEFT WRIST REDUCTION AND FUSION   07/19/2008   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   PARTIAL KNEE ARTHROPLASTY Right 09/02/2015   Procedure: RIGHT UNI KNEE ARTHROPLASTY MEDIALLY;  Surgeon: Durene Romans, MD;  Location: [facility];  Service: Orthopedics;  Laterality: Right;   TOTAL KNEE REVISION Right 03/20/2016   Procedure: RIGHT UNI KNEE POLY EXCHANGE;  Surgeon: Durene Romans, MD;  Location: [facility];  Service: Orthopedics;  Laterality: Right;   TOTAL KNEE REVISION WITH SCAR DEBRIDEMENT/PATELLA REVISION WITH POLY EXCHANGE Right 11/11/2021   Procedure: Right unicompartmental knee arthroplasty poly revision;  Surgeon: Durene Romans, MD;  Location: [facility];  Service: Orthopedics;  Laterality: Right;   TUBAL LIGATION     WISDOM TOOTH EXTRACTION Bilateral  Current Outpatient Medications  Medication Sig Dispense Refill Last Dose   cetirizine (ZYRTEC) 10 MG tablet Take 10 mg by mouth daily.      FLUoxetine (PROZAC) 20 MG capsule TAKE 1 CAPSULE EVERY DAY 90 capsule 0    omeprazole (PRILOSEC) 20 MG capsule Take 20 mg by mouth daily.      OVER THE COUNTER MEDICATION Take 1 capsule by mouth at bedtime as  needed (sleep). CBD      triamcinolone cream (KENALOG) 0.1 % Apply 1 Application topically 2 (two) times daily as needed. 30 g 1    No current facility-administered medications for this visit.   No Known Allergies  Social History   Tobacco Use   Smoking status: Former    Years: 6    Types: Cigarettes    Quit date: 06/10/1995    Years since quitting: 27.8   Smokeless tobacco: Never  Substance Use Topics   Alcohol use: Yes    Alcohol/week: 0.0 standard drinks of alcohol    Comment: occasional    Family History  Problem Relation Age of Onset   COPD Father    Emphysema Father    Arthritis Mother    Alcohol abuse Paternal Grandmother    Cancer Paternal Grandmother      Review of Systems  Musculoskeletal:  Positive for arthralgias.  All other systems reviewed and are negative.   Objective:  Physical Exam Constitutional:      General: She is not in acute distress.    Appearance: Normal appearance. She is not ill-appearing.  HENT:     Head: Normocephalic and atraumatic.     Right Ear: External ear normal.     Left Ear: External ear normal.     Nose: Nose normal.     Mouth/Throat:     Mouth: Mucous membranes are moist.     Pharynx: Oropharynx is clear.  Eyes:     Extraocular Movements: Extraocular movements intact.     Conjunctiva/sclera: Conjunctivae normal.  Cardiovascular:     Rate and Rhythm: Normal rate and regular rhythm.     Pulses: Normal pulses.     Heart sounds: Normal heart sounds.  Pulmonary:     Effort: Pulmonary effort is normal.     Breath sounds: Normal breath sounds.  Abdominal:     General: Bowel sounds are normal.     Palpations: Abdomen is soft.     Tenderness: There is no abdominal tenderness.  Musculoskeletal:        General: Tenderness present.     Cervical back: Normal range of motion and neck supple.     Comments: Left knee TTP along medial joint line.  No erythema or swelling.  No calf pain or swelling.  Painful ROM in all directions.   0-125 ROM bilaterally.  BLE appear grossly neurovascularly intact.  No lesion in area of chief complaint.  Gait mildly antalgic.  Skin:    General: Skin is warm and dry.  Neurological:     Mental Status: She is alert and oriented to person, place, and time. Mental status is at baseline.  Psychiatric:        Mood and Affect: Mood normal.        Behavior: Behavior normal.     Vital signs in last 24 hours: @VSRANGES @  Labs:   Estimated body mass index is 35.28 kg/m as calculated from the following:   Height as of 03/10/23: 5\' 5"  (1.651 m).   Weight as of 03/10/23: 96.2 kg.  Imaging Review Plain radiographs demonstrate moderate degenerative joint disease of medial compartment of the left knee(s). The overall alignment ismild varus. The bone quality appears to be good for age and reported activity level.      Assessment/Plan:  End stage arthritis, left knee   The patient history, physical examination, clinical judgment of the provider and imaging studies are consistent with end stage degenerative joint disease of the left knee(s) and total knee arthroplasty is deemed medically necessary. The treatment options including medical management, injection therapy arthroscopy and arthroplasty were discussed at length. The risks and benefits of total knee arthroplasty were presented and reviewed. The risks due to aseptic loosening, infection, stiffness, patella tracking problems, thromboembolic complications and other imponderables were discussed. The patient acknowledged the explanation, agreed to proceed with the plan and consent was signed. Patient is being admitted for inpatient treatment for surgery, pain control, PT, OT, prophylactic antibiotics, VTE prophylaxis, progressive ambulation and ADL's and discharge planning. The patient is planning to be discharged home with outpatient PT   Patient's anticipated LOS is less than 2 midnights, meeting these requirements: - Younger than 57 -  Lives within 1 hour of care - Has a competent adult at home to recover with post-op recover - NO history of  - Chronic pain requiring opiods  - Diabetes  - Coronary Artery Disease  - Heart failure  - Heart attack  - Stroke  - DVT/VTE  - Cardiac arrhythmia  - Respiratory Failure/COPD  - Renal failure  - Anemia  - Advanced Liver disease

## 2023-04-20 NOTE — H&P (View-Only) (Signed)
TOTAL KNEE ADMISSION H&P  Patient is being admitted for left total knee arthroplasty.  Subjective:  Chief Complaint:left knee pain.  HPI: Shelly [name], 52 y.o. female, has a history of pain and functional disability in the left knee due to arthritis and has failed non-surgical conservative treatments for greater than 12 weeks to includeNSAID's and/or analgesics, corticosteriod injections, and activity modification.  Onset of symptoms was gradual, starting 1 years ago with gradually worsening course since that time. The patient noted prior procedures on the knee to include  unicompartmental arthroplasty on the right knee(s).  Patient currently rates pain in the left knee(s) at 8 out of 10 with activity. Patient has night pain, worsening of pain with activity and weight bearing, pain that interferes with activities of daily living, and pain with passive range of motion.  Patient has evidence of periarticular osteophytes and joint space narrowing by imaging studies.  There is no active infection.  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   Knee injury 03/19/2016   S/P right UKR 09/02/2015   Status post unilateral knee replacement 09/02/2015   S/P right knee arthroscopy 06/11/2015   Endometrial polyp 10/11/2013   ALLERGIC RHINITIS 11/14/2009   WRIST PAIN, LEFT 10/13/2007   ASTHMA 08/20/2007   GERD 08/20/2007   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 08/2021   Carpal tunnel syndrome    Right   Depression    GERD (gastroesophageal reflux disease)    Intermittent palpitations    Loose body of right knee     Migraines    Osteochondral defect    right knee    Past Surgical History:  Procedure Laterality Date   CESAREAN SECTION  06-03-2011//  09-27-2003//  2000   Bilateral Tubal Ligation w/ last c/s   CHONDROPLASTY Right 06/11/2015   Procedure: CHONDROPLASTY;  Surgeon: Robert Collins, MD;  Location: [facility];  Service: Orthopedics;  Laterality: Right;   DILATION AND CURETTAGE OF UTERUS     FOREIGN BODY REMOVAL Right 06/11/2015   Procedure: RIGHT REMOVAL LOOSE BODY;  Surgeon: Robert Collins, MD;  Location: [facility];  Service: Orthopedics;  Laterality: Right;   KNEE ARTHROSCOPY Right 11/23/1988   KNEE ARTHROSCOPY Right 06/11/2015   Procedure: RIGHT ARTHROSCOPY KNEE;  Surgeon: Robert Collins, MD;  Location: [facility];  Service: Orthopedics;  Laterality: Right;   LEFT WRIST REDUCTION AND FUSION   07/19/2008   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   PARTIAL KNEE ARTHROPLASTY Right 09/02/2015   Procedure: RIGHT UNI KNEE ARTHROPLASTY MEDIALLY;  Surgeon: Matthew Olin, MD;  Location: [facility];  Service: Orthopedics;  Laterality: Right;   TOTAL KNEE REVISION Right 03/20/2016   Procedure: RIGHT UNI KNEE POLY EXCHANGE;  Surgeon: Matthew Olin, MD;  Location: [facility];  Service: Orthopedics;  Laterality: Right;   TOTAL KNEE REVISION WITH SCAR DEBRIDEMENT/PATELLA REVISION WITH POLY EXCHANGE Right 11/11/2021   Procedure: Right unicompartmental knee arthroplasty poly revision;  Surgeon: Olin, Matthew, MD;  Location: [facility];  Service: Orthopedics;  Laterality: Right;   TUBAL LIGATION     WISDOM TOOTH EXTRACTION Bilateral       Current Outpatient Medications  Medication Sig Dispense Refill Last Dose   cetirizine (ZYRTEC) 10 MG tablet Take 10 mg by mouth daily.      FLUoxetine (PROZAC) 20 MG capsule TAKE 1 CAPSULE EVERY DAY 90 capsule 0    omeprazole (PRILOSEC) 20 MG capsule Take 20 mg by mouth daily.      OVER THE COUNTER MEDICATION Take 1 capsule by mouth at bedtime as  needed (sleep). CBD      triamcinolone cream (KENALOG) 0.1 % Apply 1 Application topically 2 (two) times daily as needed. 30 g 1    No current facility-administered medications for this visit.   No Known Allergies  Social History   Tobacco Use   Smoking status: Former    Years: 6    Types: Cigarettes    Quit date: 06/10/1995    Years since quitting: 27.8   Smokeless tobacco: Never  Substance Use Topics   Alcohol use: Yes    Alcohol/week: 0.0 standard drinks of alcohol    Comment: occasional    Family History  Problem Relation Age of Onset   COPD Father    Emphysema Father    Arthritis Mother    Alcohol abuse Paternal Grandmother    Cancer Paternal Grandmother      Review of Systems  Musculoskeletal:  Positive for arthralgias.  All other systems reviewed and are negative.   Objective:  Physical Exam Constitutional:      General: She is not in acute distress.    Appearance: Normal appearance. She is not ill-appearing.  HENT:     Head: Normocephalic and atraumatic.     Right Ear: External ear normal.     Left Ear: External ear normal.     Nose: Nose normal.     Mouth/Throat:     Mouth: Mucous membranes are moist.     Pharynx: Oropharynx is clear.  Eyes:     Extraocular Movements: Extraocular movements intact.     Conjunctiva/sclera: Conjunctivae normal.  Cardiovascular:     Rate and Rhythm: Normal rate and regular rhythm.     Pulses: Normal pulses.     Heart sounds: Normal heart sounds.  Pulmonary:     Effort: Pulmonary effort is normal.     Breath sounds: Normal breath sounds.  Abdominal:     General: Bowel sounds are normal.     Palpations: Abdomen is soft.     Tenderness: There is no abdominal tenderness.  Musculoskeletal:        General: Tenderness present.     Cervical back: Normal range of motion and neck supple.     Comments: Left knee TTP along medial joint line.  No erythema or swelling.  No calf pain or swelling.  Painful ROM in all directions.   0-125 ROM bilaterally.  BLE appear grossly neurovascularly intact.  No lesion in area of chief complaint.  Gait mildly antalgic.  Skin:    General: Skin is warm and dry.  Neurological:     Mental Status: She is alert and oriented to person, place, and time. Mental status is at baseline.  Psychiatric:        Mood and Affect: Mood normal.        Behavior: Behavior normal.     Vital signs in last 24 hours: @VSRANGES@  Labs:   Estimated body mass index is 35.28 kg/m as calculated from the following:   Height as of 03/10/23: 5' 5" (1.651 m).   Weight as of 03/10/23: 96.2 kg.     Imaging Review Plain radiographs demonstrate moderate degenerative joint disease of medial compartment of the left knee(s). The overall alignment ismild varus. The bone quality appears to be good for age and reported activity level.      Assessment/Plan:  End stage arthritis, left knee   The patient history, physical examination, clinical judgment of the provider and imaging studies are consistent with end stage degenerative joint disease of the left knee(s) and total knee arthroplasty is deemed medically necessary. The treatment options including medical management, injection therapy arthroscopy and arthroplasty were discussed at length. The risks and benefits of total knee arthroplasty were presented and reviewed. The risks due to aseptic loosening, infection, stiffness, patella tracking problems, thromboembolic complications and other imponderables were discussed. The patient acknowledged the explanation, agreed to proceed with the plan and consent was signed. Patient is being admitted for inpatient treatment for surgery, pain control, PT, OT, prophylactic antibiotics, VTE prophylaxis, progressive ambulation and ADL's and discharge planning. The patient is planning to be discharged home with outpatient PT   Patient's anticipated LOS is less than 2 midnights, meeting these requirements: - Younger than 65 -  Lives within 1 hour of care - Has a competent adult at home to recover with post-op recover - NO history of  - Chronic pain requiring opiods  - Diabetes  - Coronary Artery Disease  - Heart failure  - Heart attack  - Stroke  - DVT/VTE  - Cardiac arrhythmia  - Respiratory Failure/COPD  - Renal failure  - Anemia  - Advanced Liver disease   

## 2023-04-21 NOTE — Patient Instructions (Signed)
SURGICAL WAITING ROOM VISITATION Patients having surgery or a procedure may have no more than 2 support people in the waiting area - these visitors may rotate in the visitor waiting room.   Due to an increase in RSV and influenza rates and associated hospitalizations, children ages 78 and under may not visit patients in [facility] hospitals. If the patient needs to stay at the hospital during part of their recovery, the visitor guidelines for inpatient rooms apply.  PRE-OP VISITATION  Pre-op nurse will coordinate an appropriate time for 1 support person to accompany the patient in pre-op.  This support person may not rotate.  This visitor will be contacted when the time is appropriate for the visitor to come back in the pre-op area.  Please refer to the [facility] website for the visitor guidelines for Inpatients (after your surgery is over and you are in a regular room).  You are not required to quarantine at this time prior to your surgery. However, you must do this: Hand Hygiene often Do NOT share personal items Notify your provider if you are in close contact with someone who has COVID or you develop fever 100.4 or greater, new onset of sneezing, cough, sore throat, shortness of breath or body aches.  If you test positive for Covid or have been in contact with anyone that has tested positive in the last 10 days please notify you surgeon.    Your procedure is scheduled on:  Wednesday  May 05, 2023  Report to [facility] Main Entrance: [facility]sen entrance where the [employer] is available.   Report to admitting at:  06:00   AM  +++++Call this number if you have any questions or problems the morning of surgery [phone]  Do not eat food after Midnight the night prior to your surgery/procedure.  After Midnight you may have the following liquids until  05:30   AM DAY OF SURGERY  Clear Liquid Diet Water Black Coffee (sugar ok, NO MILK/CREAM OR CREAMERS)  Tea (sugar ok,  NO MILK/CREAM OR CREAMERS) regular and decaf                             Plain Jell-O  with no fruit (NO RED)                                           Fruit ices (not with fruit pulp, NO RED)                                     Popsicles (NO RED)                                                                  Juice: apple, WHITE grape, WHITE cranberry Sports drinks like Gatorade or Powerade (NO RED)                    The day of surgery:  Drink ONE (1) Pre-Surgery Clear Ensure  at 05:30 AM the morning of surgery. Drink in  one sitting. Do not sip.  This drink was given to you during your hospital pre-op appointment visit. Nothing else to drink after completing the Pre-Surgery Clear Ensure  : No candy, chewing gum or throat lozenges.    FOLLOW  ANY ADDITIONAL PRE OP INSTRUCTIONS YOU RECEIVED FROM YOUR SURGEON'S OFFICE!!!   Oral Hygiene is also important to reduce your risk of infection.        Remember - BRUSH YOUR TEETH THE MORNING OF SURGERY WITH YOUR REGULAR TOOTHPASTE  Do NOT smoke after Midnight the night before surgery.  Take ONLY these medicines the morning of surgery with A SIP OF WATER: Fluoxetine (Prozac) ???                   You may not have any metal on your body including hair pins, jewelry, and body piercing  Do not wear make-up, lotions, powders, perfumes, or deodorant  Do not wear nail polish including gel and S&S, artificial / acrylic nails, or any other type of covering on natural nails including finger and toenails. If you have artificial nails, gel coating, etc., that needs to be removed by a nail salon, Please have this removed prior to surgery. Not doing so may mean that your surgery could be cancelled or delayed if the Surgeon or anesthesia staff feels like they are unable to monitor you safely.   Do not shave 48 hours prior to surgery to avoid nicks in your skin which may contribute to postoperative infections.   Contacts, Hearing Aids, dentures or  bridgework may not be worn into surgery. DENTURES WILL BE REMOVED PRIOR TO SURGERY PLEASE DO NOT APPLY "Poly grip" OR ADHESIVES!!!  Patients discharged on the day of surgery will not be allowed to drive home.  Someone NEEDS to stay with you for the first 24 hours after anesthesia.  Do not bring your home medications to the hospital. The Pharmacy will dispense medications listed on your medication list to you during your admission in the Hospital.  Special Instructions: Bring a copy of your healthcare power of attorney and living will documents the day of surgery, if you wish to have them scanned into your [facility]- EPIC  Please read over the following fact sheets you were given: IF YOU HAVE QUESTIONS ABOUT YOUR PRE-OP INSTRUCTIONS, PLEASE CALL [phone].         Pre-operative 5 CHG Bath Instructions   You can play a key role in reducing the risk of infection after surgery. Your skin needs to be as free of germs as possible. You can reduce the number of germs on your skin by washing with CHG (chlorhexidine gluconate) soap before surgery. CHG is an antiseptic soap that kills germs and continues to kill germs even after washing.   DO NOT use if you have an allergy to chlorhexidine/CHG or antibacterial soaps. If your skin becomes reddened or irritated, stop using the CHG and notify one of our RNs at [phone]  Please shower with the CHG soap starting 4 days before surgery using the following schedule: START SHOWERS ON SATURDAY  05-01-2023  Please keep in mind the following:  DO NOT shave, including legs and underarms, starting the day of your first shower.   You may shave your face at any point before/day of surgery.   Place clean sheets on your bed the day you start using CHG soap. Use  a clean washcloth (not used since being washed) for each shower. DO NOT sleep with pets once you start using the CHG.   CHG Shower Instructions:  If you choose to wash your hair and private area, wash first with your normal shampoo/soap.  After you use shampoo/soap, rinse your hair and body thoroughly to remove shampoo/soap residue.  Turn the water OFF and apply about 3 tablespoons (45 ml) of CHG soap to a CLEAN washcloth.  Apply CHG soap ONLY FROM YOUR NECK DOWN TO YOUR TOES (washing for 3-5 minutes)  DO NOT use CHG soap on face, private areas, open wounds, or sores.  Pay special attention to the area where your surgery is being performed.  If you are having back surgery, having someone wash your back for you may be helpful.  Wait 2 minutes after CHG soap is applied, then you may rinse off the CHG soap.  Pat dry with a clean towel  Put on clean clothes/pajamas   If you choose to wear lotion, please use ONLY the CHG-compatible lotions on the back of this paper.     Additional instructions for the day of surgery: DO NOT APPLY any lotions, deodorants, cologne, or perfumes.   Put on clean/comfortable clothes.  Brush your teeth.  Ask your nurse before applying any prescription medications to the skin.      CHG Compatible Lotions   Aveeno Moisturizing lotion  Cetaphil Moisturizing Cream  Cetaphil Moisturizing Lotion  Clairol Herbal Essence Moisturizing Lotion, Dry Skin  Clairol Herbal Essence Moisturizing Lotion, Extra Dry Skin  Clairol Herbal Essence Moisturizing Lotion, Normal Skin  Curel Age Defying Therapeutic Moisturizing Lotion with Alpha Hydroxy  Curel Extreme Care Body Lotion  Curel Soothing Hands Moisturizing Hand Lotion  Curel Therapeutic Moisturizing Cream, Fragrance-Free  Curel Therapeutic Moisturizing Lotion, Fragrance-Free  Curel Therapeutic Moisturizing Lotion, Original Formula  Eucerin Daily Replenishing Lotion  Eucerin Dry Skin Therapy Plus Alpha Hydroxy Crme   Eucerin Dry Skin Therapy Plus Alpha Hydroxy Lotion  Eucerin Original Crme  Eucerin Original Lotion  Eucerin Plus Crme Eucerin Plus Lotion  Eucerin TriLipid Replenishing Lotion  Keri Anti-Bacterial Hand Lotion  Keri Deep Conditioning Original Lotion Dry Skin Formula Softly Scented  Keri Deep Conditioning Original Lotion, Fragrance Free Sensitive Skin Formula  Keri Lotion Fast Absorbing Fragrance Free Sensitive Skin Formula  Keri Lotion Fast Absorbing Softly Scented Dry Skin Formula  Keri Original Lotion  Keri Skin Renewal Lotion Keri Silky Smooth Lotion  Keri Silky Smooth Sensitive Skin Lotion  Nivea Body Creamy Conditioning Oil  Nivea Body Extra Enriched Lotion  Nivea Body Original Lotion  Nivea Body Sheer Moisturizing Lotion Nivea Crme  Nivea Skin Firming Lotion  NutraDerm 30 Skin Lotion  NutraDerm Skin Lotion  NutraDerm Therapeutic Skin Cream  NutraDerm Therapeutic Skin Lotion  ProShield Protective Hand Cream  Provon moisturizing lotion ON THE DAY OF SURGERY : Do not apply any lotions/deodorants the morning of surgery.  Please wear clean clothes to the hospital/surgery center.    FAILURE TO FOLLOW THESE INSTRUCTIONS MAY RESULT IN THE CANCELLATION OF YOUR SURGERY  PATIENT SIGNATURE_________________________________  NURSE SIGNATURE__________________________________  ________________________________________________________________________       Rogelia Mire    An incentive spirometer is  a tool that can help keep your lungs clear and active. This tool measures how well you are filling your lungs with each breath. Taking long deep breaths may help reverse or decrease the chance of developing breathing (pulmonary) problems (especially infection) following: A long period of time when you are unable to move or be active. BEFORE THE PROCEDURE  If the spirometer includes an indicator to show your best effort, your nurse or respiratory therapist will set it to a  desired goal. If possible, sit up straight or lean slightly forward. Try not to slouch. Hold the incentive spirometer in an upright position. INSTRUCTIONS FOR USE  Sit on the edge of your bed if possible, or sit up as far as you can in bed or on a chair. Hold the incentive spirometer in an upright position. Breathe out normally. Place the mouthpiece in your mouth and seal your lips tightly around it. Breathe in slowly and as deeply as possible, raising the piston or the ball toward the top of the column. Hold your breath for 3-5 seconds or for as long as possible. Allow the piston or ball to fall to the bottom of the column. Remove the mouthpiece from your mouth and breathe out normally. Rest for a few seconds and repeat Steps 1 through 7 at least 10 times every 1-2 hours when you are awake. Take your time and take a few normal breaths between deep breaths. The spirometer may include an indicator to show your best effort. Use the indicator as a goal to work toward during each repetition. After each set of 10 deep breaths, practice coughing to be sure your lungs are clear. If you have an incision (the cut made at the time of surgery), support your incision when coughing by placing a pillow or rolled up towels firmly against it. Once you are able to get out of bed, walk around indoors and cough well. You may stop using the incentive spirometer when instructed by your caregiver.  RISKS AND COMPLICATIONS Take your time so you do not get dizzy or light-headed. If you are in pain, you may need to take or ask for pain medication before doing incentive spirometry. It is harder to take a deep breath if you are having pain. AFTER USE Rest and breathe slowly and easily. It can be helpful to keep track of a log of your progress. Your caregiver can provide you with a simple table to help with this. If you are using the spirometer at home, follow these instructions: SEEK MEDICAL CARE IF:  You are having  difficultly using the spirometer. You have trouble using the spirometer as often as instructed. Your pain medication is not giving enough relief while using the spirometer. You develop fever of 100.5 F (38.1 C) or higher.                                                                                                    SEEK IMMEDIATE MEDICAL CARE IF:  You cough up bloody sputum that had not been present before. You develop fever of 102 F (  38.9 C) or greater. You develop worsening pain at or near the incision site. MAKE SURE YOU:  Understand these instructions. Will watch your condition. Will get help right away if you are not doing well or get worse. Document Released: 03/22/2007 Document Revised: 02/01/2012 Document Reviewed: 05/23/2007 [facility] Patient Information 2014 [place], [place].      WHAT IS A BLOOD TRANSFUSION? Blood Transfusion Information  A transfusion is the replacement of blood or some of its parts. Blood is made up of multiple cells which provide different functions. Red blood cells carry oxygen and are used for blood loss replacement. White blood cells fight against infection. Platelets control bleeding. Plasma helps clot blood. Other blood products are available for specialized needs, such as hemophilia or other clotting disorders. BEFORE THE TRANSFUSION  Who gives blood for transfusions?  Healthy volunteers who are fully evaluated to make sure their blood is safe. This is blood bank blood. Transfusion therapy is the safest it has ever been in the practice of medicine. Before blood is taken from a donor, a complete history is taken to make sure that person has no history of diseases nor engages in risky social behavior (examples are intravenous drug use or sexual activity with multiple partners). The donor's travel history is screened to minimize risk of transmitting infections, such as malaria. The donated blood is tested for signs of infectious diseases, such as  HIV and hepatitis. The blood is then tested to be sure it is compatible with you in order to minimize the chance of a transfusion reaction. If you or a relative donates blood, this is often done in anticipation of surgery and is not appropriate for emergency situations. It takes many days to process the donated blood. RISKS AND COMPLICATIONS Although transfusion therapy is very safe and saves many lives, the main dangers of transfusion include:  Getting an infectious disease. Developing a transfusion reaction. This is an allergic reaction to something in the blood you were given. Every precaution is taken to prevent this. The decision to have a blood transfusion has been considered carefully by your caregiver before blood is given. Blood is not given unless the benefits outweigh the risks. AFTER THE TRANSFUSION Right after receiving a blood transfusion, you will usually feel much better and more energetic. This is especially true if your red blood cells have gotten low (anemic). The transfusion raises the level of the red blood cells which carry oxygen, and this usually causes an energy increase. The nurse administering the transfusion will monitor you carefully for complications. HOME CARE INSTRUCTIONS  No special instructions are needed after a transfusion. You may find your energy is better. Speak with your caregiver about any limitations on activity for underlying diseases you may have. SEEK MEDICAL CARE IF:  Your condition is not improving after your transfusion. You develop redness or irritation at the intravenous (IV) site. SEEK IMMEDIATE MEDICAL CARE IF:  Any of the following symptoms occur over the next 12 hours: Shaking chills. You have a temperature by mouth above 102 F (38.9 C), not controlled by medicine. Chest, back, or muscle pain. People around you feel you are not acting correctly or are confused. Shortness of breath or difficulty breathing. Dizziness and fainting. You get a  rash or develop hives. You have a decrease in urine output. Your urine turns a dark color or changes to pink, red, or brown. Any of the following symptoms occur over the next 10 days: You have a temperature by mouth above 102 F (  38.9 C), not controlled by medicine. Shortness of breath. Weakness after normal activity. The white part of the eye turns yellow (jaundice). You have a decrease in the amount of urine or are urinating less often. Your urine turns a dark color or changes to pink, red, or brown. Document Released: 11/06/2000 Document Revised: 02/01/2012 Document Reviewed: 06/25/2008 [facility] Patient Information 2014 [place], [place].      _______________________________________________________________________

## 2023-04-21 NOTE — Progress Notes (Signed)
COVID Vaccine received:  []  No [x]  Yes Date of any COVID positive Test in last 90 days:  None  PCP - Betty [place], MD at [facility] clearance in 03-10-23 note Cardiologist - none  Chest x-ray -  EKG -  03-10-2023  Epic Stress Test -  ECHO -  Cardiac Cath -   PCR screen: [x]  Ordered & Completed           []   No Order but Needs PROFEND           []   N/A for this surgery  Surgery Plan:  [x]  Ambulatory                            []  Outpatient in bed                            []  Admit  Anesthesia:    []  General  [x]  Spinal                           []   Choice []   MAC  Pacemaker / ICD device [x]  No []  Yes   Spinal Cord Stimulator:[x]  No []  Yes       History of Sleep Apnea? [x]  No []  Yes    Will be having a Sleep Study soon CPAP used?- [x]  No []  Yes    Does the patient monitor blood sugar?          []  No []  Yes  [x]  N/A  Patient has: [x]  NO Hx DM   []  Pre-DM                 []  DM1  []   DM2  Blood Thinner / Instructions:none Aspirin Instructions:  none  ERAS Protocol Ordered: []  No  [x]  Yes PRE-SURGERY [x]  ENSURE  []  G2  Patient is to be NPO after:  05:30 am  Patient was given the 5 CHG shower / bath instructions for THA / TKA / Total or Reverse Shoulder arthroplasty surgery along with 2 bottles of the CHG soap. Patient will start this on:  Saturday  May 01, 2023.  All questions were asked and answered, Patient voiced understanding of this process.   Activity level: Patient is able to climb a flight of stairs without difficulty; [x]  No CP  [x]  No SOB, but would have _Leg pain.  Patient can perform ADLs without assistance.   Anesthesia review: asthma, GERD, anxiety, anemia, patient reports recent "heart flutters" but has not mentioned it to any MD (she doesn't know if it is related to her anxiety/ panic attacks.   Patient denies shortness of breath, fever, cough and chest pain at PAT appointment.  Patient verbalized understanding and agreement to the  Pre-Surgical Instructions that were given to them at this PAT appointment. Patient was also educated of the need to review these PAT instructions again prior to her surgery.I reviewed the appropriate phone numbers to call if they have any and questions or concerns.

## 2023-04-22 ENCOUNTER — Encounter (HOSPITAL_COMMUNITY): Payer: Self-pay | Admitting: *Deleted

## 2023-04-22 ENCOUNTER — Encounter (HOSPITAL_COMMUNITY)
Admission: RE | Admit: 2023-04-22 | Discharge: 2023-04-22 | Disposition: A | Discharge status: Home / Self Care | Payer: No Typology Code available for payment source | Source: Ambulatory Visit | Attending: Orthopedic Surgery | Admitting: Orthopedic Surgery

## 2023-04-22 ENCOUNTER — Other Ambulatory Visit: Payer: Self-pay

## 2023-04-22 VITALS — BP 125/85 | HR 84 | Temp 98.9°F | Resp 20 | Ht 67.0 in | Wt 210.0 lb

## 2023-04-22 DIAGNOSIS — M25562 Pain in left knee: Secondary | ICD-10-CM | POA: Diagnosis not present

## 2023-04-22 DIAGNOSIS — G8929 Other chronic pain: Secondary | ICD-10-CM | POA: Diagnosis not present

## 2023-04-22 DIAGNOSIS — Z01812 Encounter for preprocedural laboratory examination: Secondary | ICD-10-CM | POA: Insufficient documentation

## 2023-04-22 DIAGNOSIS — Z01818 Encounter for other preprocedural examination: Secondary | ICD-10-CM

## 2023-04-22 HISTORY — DX: Cardiac arrhythmia, unspecified: I49.9

## 2023-04-22 LAB — CBC WITH DIFFERENTIAL/PLATELET
Abs Immature Granulocytes: 0.02 10*3/uL (ref 0.00–0.07)
Basophils Absolute: 0.1 10*3/uL (ref 0.0–0.1)
Basophils Relative: 1 %
Eosinophils Absolute: 0.1 10*3/uL (ref 0.0–0.5)
Eosinophils Relative: 2 %
HCT: 39.4 % (ref 36.0–46.0)
Hemoglobin: 13 g/dL (ref 12.0–15.0)
Immature Granulocytes: 0 %
Lymphocytes Relative: 35 %
Lymphs Abs: 2.8 10*3/uL (ref 0.7–4.0)
MCH: 29.9 pg (ref 26.0–34.0)
MCHC: 33 g/dL (ref 30.0–36.0)
MCV: 90.6 fL (ref 80.0–100.0)
Monocytes Absolute: 0.6 10*3/uL (ref 0.1–1.0)
Monocytes Relative: 8 %
Neutro Abs: 4.3 10*3/uL (ref 1.7–7.7)
Neutrophils Relative %: 54 %
Platelets: 253 10*3/uL (ref 150–400)
RBC: 4.35 MIL/uL (ref 3.87–5.11)
RDW: 13.2 % (ref 11.5–15.5)
WBC: 7.9 10*3/uL (ref 4.0–10.5)
nRBC: 0 % (ref 0.0–0.2)

## 2023-04-22 LAB — TYPE AND SCREEN: ABO/RH(D): A POS

## 2023-04-22 LAB — COMPREHENSIVE METABOLIC PANEL
ALT: 20 U/L (ref 0–44)
AST: 19 U/L (ref 15–41)
Albumin: 4 g/dL (ref 3.5–5.0)
Alkaline Phosphatase: 65 U/L (ref 38–126)
Anion gap: 5 (ref 5–15)
BUN: 14 mg/dL (ref 6–20)
CO2: 28 mmol/L (ref 22–32)
Calcium: 9 mg/dL (ref 8.9–10.3)
Chloride: 106 mmol/L (ref 98–111)
Creatinine, Ser: 0.85 mg/dL (ref 0.44–1.00)
GFR, Estimated: >60 mL/min (ref >60)
Glucose, Bld: 96 mg/dL (ref 70–99)
Potassium: 4.8 mmol/L (ref 3.5–5.1)
Sodium: 139 mmol/L (ref 135–145)
Total Bilirubin: 0.5 mg/dL (ref 0.3–1.2)
Total Protein: 7.4 g/dL (ref 6.5–8.1)

## 2023-04-22 LAB — SURGICAL PCR SCREEN
MRSA, PCR: NEGATIVE
Staphylococcus aureus: NEGATIVE

## 2023-04-25 ENCOUNTER — Other Ambulatory Visit: Payer: Self-pay | Admitting: Family Medicine

## 2023-04-25 DIAGNOSIS — F419 Anxiety disorder, unspecified: Secondary | ICD-10-CM

## 2023-04-25 DIAGNOSIS — F3341 Major depressive disorder, recurrent, in partial remission: Secondary | ICD-10-CM

## 2023-05-05 ENCOUNTER — Ambulatory Visit (HOSPITAL_COMMUNITY): Payer: No Typology Code available for payment source | Admitting: Anesthesiology

## 2023-05-05 ENCOUNTER — Ambulatory Visit (HOSPITAL_COMMUNITY)
Admission: RE | Admit: 2023-05-05 | Discharge: 2023-05-05 | Disposition: A | Discharge status: Home / Self Care | Payer: No Typology Code available for payment source | Source: Ambulatory Visit | Attending: Orthopedic Surgery | Admitting: Orthopedic Surgery

## 2023-05-05 ENCOUNTER — Ambulatory Visit (HOSPITAL_COMMUNITY): Payer: No Typology Code available for payment source | Admitting: Physician Assistant

## 2023-05-05 ENCOUNTER — Institutional Professional Consult (permissible substitution): Payer: No Typology Code available for payment source | Admitting: Neurology

## 2023-05-05 ENCOUNTER — Other Ambulatory Visit: Payer: Self-pay

## 2023-05-05 ENCOUNTER — Ambulatory Visit (HOSPITAL_COMMUNITY): Payer: No Typology Code available for payment source

## 2023-05-05 ENCOUNTER — Encounter (HOSPITAL_COMMUNITY)
Admission: RE | Disposition: A | Discharge status: Home / Self Care | Payer: Self-pay | Source: Ambulatory Visit | Attending: Orthopedic Surgery

## 2023-05-05 ENCOUNTER — Encounter (HOSPITAL_COMMUNITY): Payer: Self-pay | Admitting: Orthopedic Surgery

## 2023-05-05 DIAGNOSIS — F419 Anxiety disorder, unspecified: Secondary | ICD-10-CM | POA: Insufficient documentation

## 2023-05-05 DIAGNOSIS — M1712 Unilateral primary osteoarthritis, left knee: Secondary | ICD-10-CM | POA: Diagnosis not present

## 2023-05-05 DIAGNOSIS — R7303 Prediabetes: Secondary | ICD-10-CM | POA: Insufficient documentation

## 2023-05-05 DIAGNOSIS — E782 Mixed hyperlipidemia: Secondary | ICD-10-CM | POA: Diagnosis not present

## 2023-05-05 DIAGNOSIS — J45909 Unspecified asthma, uncomplicated: Secondary | ICD-10-CM

## 2023-05-05 DIAGNOSIS — R519 Headache, unspecified: Secondary | ICD-10-CM | POA: Insufficient documentation

## 2023-05-05 DIAGNOSIS — F32A Depression, unspecified: Secondary | ICD-10-CM | POA: Insufficient documentation

## 2023-05-05 DIAGNOSIS — F418 Other specified anxiety disorders: Secondary | ICD-10-CM | POA: Diagnosis not present

## 2023-05-05 DIAGNOSIS — K219 Gastro-esophageal reflux disease without esophagitis: Secondary | ICD-10-CM | POA: Insufficient documentation

## 2023-05-05 DIAGNOSIS — Z87891 Personal history of nicotine dependence: Secondary | ICD-10-CM | POA: Insufficient documentation

## 2023-05-05 HISTORY — PX: PARTIAL KNEE ARTHROPLASTY: SHX2174

## 2023-05-05 LAB — TYPE AND SCREEN: Antibody Screen: NEGATIVE

## 2023-05-05 SURGERY — ARTHROPLASTY, KNEE, UNICOMPARTMENTAL
Anesthesia: Spinal | Site: Knee | Laterality: Left

## 2023-05-05 MED ORDER — LACTATED RINGERS IV SOLN: INTRAVENOUS | Status: DC

## 2023-05-05 MED ORDER — FENTANYL CITRATE (PF) 100 MCG/2ML IJ SOLN
INTRAMUSCULAR | Status: AC
  Filled 2023-05-05: qty 2

## 2023-05-05 MED ORDER — BUPIVACAINE HCL 0.25 % IJ SOLN
INTRAMUSCULAR | Status: AC
  Filled 2023-05-05: qty 1

## 2023-05-05 MED ORDER — CEFAZOLIN SODIUM-DEXTROSE 2-4 GM/100ML-% IV SOLN
2.0000 g | Freq: Four times a day (QID) | INTRAVENOUS | Status: DC
  Administered 2023-05-05: 2 g via INTRAVENOUS

## 2023-05-05 MED ORDER — MIDAZOLAM HCL 2 MG/2ML IJ SOLN
INTRAMUSCULAR | Status: AC
  Filled 2023-05-05: qty 2

## 2023-05-05 MED ORDER — TRANEXAMIC ACID-NACL 1000-0.7 MG/100ML-% IV SOLN
INTRAVENOUS | Status: AC
  Filled 2023-05-05: qty 100

## 2023-05-05 MED ORDER — MIDAZOLAM HCL 5 MG/5ML IJ SOLN
INTRAMUSCULAR | Status: DC | PRN
  Administered 2023-05-05: 2 mg via INTRAVENOUS

## 2023-05-05 MED ORDER — BUPIVACAINE LIPOSOME 1.3 % IJ SUSP: 20.0000 mL | Freq: Once | INTRAMUSCULAR | Status: DC

## 2023-05-05 MED ORDER — PHENYLEPHRINE HCL-NACL 20-0.9 MG/250ML-% IV SOLN
INTRAVENOUS | Status: AC
  Filled 2023-05-05: qty 250

## 2023-05-05 MED ORDER — PROPOFOL 10 MG/ML IV BOLUS
INTRAVENOUS | Status: DC | PRN
  Administered 2023-05-05: 90 ug/kg/min via INTRAVENOUS

## 2023-05-05 MED ORDER — LACTATED RINGERS IV BOLUS
250.0000 mL | Freq: Once | INTRAVENOUS | Status: AC
  Administered 2023-05-05: 250 mL via INTRAVENOUS

## 2023-05-05 MED ORDER — PROPOFOL 10 MG/ML IV BOLUS
INTRAVENOUS | Status: AC
  Filled 2023-05-05: qty 20

## 2023-05-05 MED ORDER — METHOCARBAMOL 500 MG PO TABS: 500.0000 mg | ORAL_TABLET | Freq: Four times a day (QID) | ORAL | Status: DC | PRN

## 2023-05-05 MED ORDER — LACTATED RINGERS IV BOLUS
500.0000 mL | Freq: Once | INTRAVENOUS | Status: AC
  Administered 2023-05-05: 500 mL via INTRAVENOUS

## 2023-05-05 MED ORDER — ORAL CARE MOUTH RINSE: 15.0000 mL | Freq: Once | OROMUCOSAL | Status: AC

## 2023-05-05 MED ORDER — BUPIVACAINE LIPOSOME 1.3 % IJ SUSP
INTRAMUSCULAR | Status: AC
  Filled 2023-05-05: qty 20

## 2023-05-05 MED ORDER — MELOXICAM 15 MG PO TABS: 15.0000 mg | ORAL_TABLET | Freq: Every day | ORAL | 2 refills | Status: AC

## 2023-05-05 MED ORDER — BUPIVACAINE LIPOSOME 1.3 % IJ SUSP
INTRAMUSCULAR | Status: DC | PRN
  Administered 2023-05-05: 20 mL

## 2023-05-05 MED ORDER — PROMETHAZINE HCL 25 MG/ML IJ SOLN: 6.2500 mg | INTRAMUSCULAR | Status: DC | PRN

## 2023-05-05 MED ORDER — ROPIVACAINE HCL 5 MG/ML IJ SOLN
INTRAMUSCULAR | Status: DC | PRN
  Administered 2023-05-05: 20 mL via PERINEURAL

## 2023-05-05 MED ORDER — ACETAMINOPHEN 500 MG PO TABS
ORAL_TABLET | ORAL | Status: AC
  Administered 2023-05-05: 1000 mg via ORAL
  Filled 2023-05-05: qty 2

## 2023-05-05 MED ORDER — METHOCARBAMOL 500 MG IVPB - SIMPLE MED
INTRAVENOUS | Status: AC
  Filled 2023-05-05: qty 55

## 2023-05-05 MED ORDER — DEXAMETHASONE SODIUM PHOSPHATE 10 MG/ML IJ SOLN
8.0000 mg | Freq: Once | INTRAMUSCULAR | Status: AC
  Administered 2023-05-05: 8 mg via INTRAVENOUS

## 2023-05-05 MED ORDER — ACETAMINOPHEN 500 MG PO TABS: 1000.0000 mg | ORAL_TABLET | Freq: Three times a day (TID) | ORAL | 0 refills | Status: AC | PRN

## 2023-05-05 MED ORDER — CHLORHEXIDINE GLUCONATE 0.12 % MT SOLN
15.0000 mL | Freq: Once | OROMUCOSAL | Status: AC
  Administered 2023-05-05: 15 mL via OROMUCOSAL

## 2023-05-05 MED ORDER — CEFAZOLIN SODIUM-DEXTROSE 2-4 GM/100ML-% IV SOLN
2.0000 g | INTRAVENOUS | Status: AC
  Administered 2023-05-05: 2 g via INTRAVENOUS

## 2023-05-05 MED ORDER — TRANEXAMIC ACID-NACL 1000-0.7 MG/100ML-% IV SOLN
1000.0000 mg | INTRAVENOUS | Status: AC
  Administered 2023-05-05: 1000 mg via INTRAVENOUS

## 2023-05-05 MED ORDER — BUPIVACAINE IN DEXTROSE 0.75-8.25 % IT SOLN
INTRATHECAL | Status: DC | PRN
  Administered 2023-05-05: 2 mL via INTRATHECAL

## 2023-05-05 MED ORDER — POLYETHYLENE GLYCOL 3350 17 G PO PACK: 17.0000 g | PACK | Freq: Every day | ORAL | 0 refills | Status: DC

## 2023-05-05 MED ORDER — ONDANSETRON HCL 4 MG PO TABS: 4.0000 mg | ORAL_TABLET | Freq: Three times a day (TID) | ORAL | 0 refills | Status: AC | PRN

## 2023-05-05 MED ORDER — LIDOCAINE HCL (PF) 2 % IJ SOLN
INTRAMUSCULAR | Status: AC
  Filled 2023-05-05: qty 10

## 2023-05-05 MED ORDER — DEXAMETHASONE SODIUM PHOSPHATE 10 MG/ML IJ SOLN
INTRAMUSCULAR | Status: AC
  Filled 2023-05-05: qty 1

## 2023-05-05 MED ORDER — SODIUM CHLORIDE 0.9 % IR SOLN
Status: DC | PRN
  Administered 2023-05-05: 1000 mL
  Administered 2023-05-05: 3000 mL

## 2023-05-05 MED ORDER — SODIUM CHLORIDE 0.9 % IV SOLN: INTRAVENOUS | Status: DC

## 2023-05-05 MED ORDER — METHOCARBAMOL 500 MG PO TABS: 500.0000 mg | ORAL_TABLET | Freq: Three times a day (TID) | ORAL | 0 refills | Status: AC | PRN

## 2023-05-05 MED ORDER — POVIDONE-IODINE 10 % EX SWAB: 2.0000 | Freq: Once | CUTANEOUS | Status: DC

## 2023-05-05 MED ORDER — ONDANSETRON HCL 4 MG/2ML IJ SOLN
INTRAMUSCULAR | Status: DC | PRN
  Administered 2023-05-05: 4 mg via INTRAVENOUS

## 2023-05-05 MED ORDER — ACETAMINOPHEN 500 MG PO TABS
1000.0000 mg | ORAL_TABLET | Freq: Four times a day (QID) | ORAL | Status: DC
  Administered 2023-05-05: 1000 mg via ORAL

## 2023-05-05 MED ORDER — ISOPROPYL ALCOHOL 70 % SOLN
Status: DC | PRN
  Administered 2023-05-05: 1 via TOPICAL

## 2023-05-05 MED ORDER — OXYCODONE HCL 5 MG PO TABS: 5.0000 mg | ORAL_TABLET | Freq: Once | ORAL | Status: DC | PRN

## 2023-05-05 MED ORDER — SODIUM CHLORIDE (PF) 0.9 % IJ SOLN
INTRAMUSCULAR | Status: AC
  Filled 2023-05-05: qty 10

## 2023-05-05 MED ORDER — KETOROLAC TROMETHAMINE 15 MG/ML IJ SOLN
15.0000 mg | Freq: Four times a day (QID) | INTRAMUSCULAR | Status: DC
  Administered 2023-05-05: 15 mg via INTRAVENOUS
  Filled 2023-05-05: qty 1

## 2023-05-05 MED ORDER — PROPOFOL 500 MG/50ML IV EMUL
INTRAVENOUS | Status: DC | PRN
  Administered 2023-05-05: 80 mg via INTRAVENOUS

## 2023-05-05 MED ORDER — CEFAZOLIN SODIUM-DEXTROSE 2-4 GM/100ML-% IV SOLN
INTRAVENOUS | Status: AC
  Filled 2023-05-05: qty 100

## 2023-05-05 MED ORDER — FENTANYL CITRATE (PF) 100 MCG/2ML IJ SOLN
INTRAMUSCULAR | Status: DC | PRN
  Administered 2023-05-05: 100 ug via INTRAVENOUS

## 2023-05-05 MED ORDER — OXYCODONE HCL 5 MG PO TABS: 5.0000 mg | ORAL_TABLET | ORAL | 0 refills | Status: AC | PRN

## 2023-05-05 MED ORDER — SODIUM CHLORIDE (PF) 0.9 % IJ SOLN
INTRAMUSCULAR | Status: AC
  Filled 2023-05-05: qty 20

## 2023-05-05 MED ORDER — OXYCODONE HCL 5 MG/5ML PO SOLN: 5.0000 mg | Freq: Once | ORAL | Status: DC | PRN

## 2023-05-05 MED ORDER — ACETAMINOPHEN 500 MG PO TABS
ORAL_TABLET | ORAL | Status: AC
  Filled 2023-05-05: qty 2

## 2023-05-05 MED ORDER — OXYCODONE HCL 5 MG PO TABS: 5.0000 mg | ORAL_TABLET | ORAL | Status: DC | PRN

## 2023-05-05 MED ORDER — ACETAMINOPHEN 500 MG PO TABS: 1000.0000 mg | ORAL_TABLET | Freq: Once | ORAL | Status: AC

## 2023-05-05 MED ORDER — ASPIRIN 81 MG PO TBEC: 81.0000 mg | DELAYED_RELEASE_TABLET | Freq: Two times a day (BID) | ORAL | 0 refills | Status: AC

## 2023-05-05 MED ORDER — STERILE WATER FOR IRRIGATION IR SOLN
Status: DC | PRN
  Administered 2023-05-05: 2000 mL

## 2023-05-05 MED ORDER — ONDANSETRON HCL 4 MG/2ML IJ SOLN
INTRAMUSCULAR | Status: AC
  Filled 2023-05-05: qty 2

## 2023-05-05 MED ORDER — LIDOCAINE HCL (PF) 2 % IJ SOLN
INTRAMUSCULAR | Status: DC | PRN
  Administered 2023-05-05: 60 mg via INTRADERMAL

## 2023-05-05 MED ORDER — HYDROMORPHONE HCL 1 MG/ML IJ SOLN: 0.5000 mg | INTRAMUSCULAR | Status: DC | PRN

## 2023-05-05 MED ORDER — ONDANSETRON HCL 4 MG PO TABS: 4.0000 mg | ORAL_TABLET | Freq: Four times a day (QID) | ORAL | Status: DC | PRN

## 2023-05-05 MED ORDER — METHOCARBAMOL 500 MG IVPB - SIMPLE MED
500.0000 mg | Freq: Four times a day (QID) | INTRAVENOUS | Status: DC | PRN
  Administered 2023-05-05: 500 mg via INTRAVENOUS

## 2023-05-05 MED ORDER — ONDANSETRON HCL 4 MG/2ML IJ SOLN: 4.0000 mg | Freq: Four times a day (QID) | INTRAMUSCULAR | Status: DC | PRN

## 2023-05-05 MED ORDER — HYDROMORPHONE HCL 1 MG/ML IJ SOLN: 0.2500 mg | INTRAMUSCULAR | Status: DC | PRN

## 2023-05-05 MED ORDER — ACETAMINOPHEN 325 MG PO TABS: 325.0000 mg | ORAL_TABLET | Freq: Four times a day (QID) | ORAL | Status: DC | PRN

## 2023-05-05 MED ORDER — OXYCODONE HCL 5 MG PO TABS
ORAL_TABLET | ORAL | Status: AC
  Filled 2023-05-05: qty 1

## 2023-05-05 SURGICAL SUPPLY — 68 items
ADH SKN CLS APL DERMABOND .7 (GAUZE/BANDAGES/DRESSINGS) ×1
APL PRP STRL LF DISP 70% ISPRP (MISCELLANEOUS) ×1
BAG COUNTER SPONGE SURGICOUNT (BAG) ×1 IMPLANT
BAG SPNG CNTER NS LX DISP (BAG) ×1
BLADE SAW RECIPROCATING 77.5 (BLADE) ×1 IMPLANT
BLADE SAW SGTL 13.0X1.19X90.0M (BLADE) ×1 IMPLANT
BNDG CMPR 5X3 CHSV STRCH STRL (GAUZE/BANDAGES/DRESSINGS) ×1
BNDG CMPR MED 10X6 ELC LF (GAUZE/BANDAGES/DRESSINGS) ×1
BNDG CMPR MED 15X6 ELC VLCR LF (GAUZE/BANDAGES/DRESSINGS) ×1
BNDG COHESIVE 3X5 TAN ST LF (GAUZE/BANDAGES/DRESSINGS) ×1 IMPLANT
BNDG ELASTIC 6X10 VLCR STRL LF (GAUZE/BANDAGES/DRESSINGS) ×1 IMPLANT
BNDG ELASTIC 6X15 VLCR STRL LF (GAUZE/BANDAGES/DRESSINGS) IMPLANT
BOWL SMART MIX CTS (DISPOSABLE) ×1 IMPLANT
BRNG TIB E 9 STRL KN LT MDL (Joint) ×1 IMPLANT
BSPLAT TIB E STRL KN LT MED TI (Joint) ×1 IMPLANT
CEMENT BONE R 1X40 (Cement) ×1 IMPLANT
CHLORAPREP W/TINT 26 (MISCELLANEOUS) ×1 IMPLANT
COVER SURGICAL LIGHT HANDLE (MISCELLANEOUS) ×1 IMPLANT
CUFF TOURN SGL QUICK 34 (TOURNIQUET CUFF) ×1
CUFF TRNQT CYL 34X4.125X (TOURNIQUET CUFF) ×1 IMPLANT
DERMABOND ADVANCED .7 DNX12 (GAUZE/BANDAGES/DRESSINGS) ×1 IMPLANT
DRAPE INCISE IOBAN 85X60 (DRAPES) ×1 IMPLANT
DRAPE SHEET LG 3/4 BI-LAMINATE (DRAPES) ×1 IMPLANT
DRAPE U-SHAPE 47X51 STRL (DRAPES) ×1 IMPLANT
DRESSING AQUACEL AG SP 3.5X10 (GAUZE/BANDAGES/DRESSINGS) IMPLANT
DRSG AQUACEL AG ADV 3.5X 6 (GAUZE/BANDAGES/DRESSINGS) IMPLANT
DRSG AQUACEL AG ADV 3.5X10 (GAUZE/BANDAGES/DRESSINGS) IMPLANT
DRSG AQUACEL AG SP 3.5X10 (GAUZE/BANDAGES/DRESSINGS)
ELECT REM PT RETURN 15FT ADLT (MISCELLANEOUS) ×1 IMPLANT
GAUZE SPONGE 4X4 12PLY STRL (GAUZE/BANDAGES/DRESSINGS) ×1 IMPLANT
GLOVE BIOGEL PI IND STRL 8 (GLOVE) ×1 IMPLANT
GLOVE SURG ORTHO 8.0 STRL STRW (GLOVE) ×2 IMPLANT
GOWN STRL REUS W/ TWL LRG LVL3 (GOWN DISPOSABLE) IMPLANT
GOWN STRL REUS W/ TWL XL LVL3 (GOWN DISPOSABLE) ×1 IMPLANT
GOWN STRL REUS W/TWL LRG LVL3 (GOWN DISPOSABLE)
GOWN STRL REUS W/TWL XL LVL3 (GOWN DISPOSABLE) ×1
HANDPIECE INTERPULSE COAX TIP (DISPOSABLE) ×1
HOOD PEEL AWAY T7 (MISCELLANEOUS) ×3 IMPLANT
INSERTER TIP PARTIAL KNEE (MISCELLANEOUS) IMPLANT
KIT TURNOVER KIT A (KITS) IMPLANT
MANIFOLD NEPTUNE II (INSTRUMENTS) ×1 IMPLANT
MARKER SKIN DUAL TIP RULER LAB (MISCELLANEOUS) ×1 IMPLANT
NDL SAFETY ECLIP 18X1.5 (MISCELLANEOUS) ×2 IMPLANT
NDL SPNL 18GX3.5 QUINCKE PK (NEEDLE) ×1 IMPLANT
NEEDLE SPNL 18GX3.5 QUINCKE PK (NEEDLE) ×1 IMPLANT
NS IRRIG 1000ML POUR BTL (IV SOLUTION) ×1 IMPLANT
PACK TOTAL KNEE CUSTOM (KITS) ×1 IMPLANT
PIN DRILL HDLS TROCAR 75 4PK (PIN) IMPLANT
SCREW HEADED 33MM KNEE (MISCELLANEOUS) IMPLANT
SCREW HEADED 48MM KNEE (MISCELLANEOUS) IMPLANT
SET HNDPC FAN SPRY TIP SCT (DISPOSABLE) ×1 IMPLANT
STRIP CLOSURE SKIN 1/2X4 (GAUZE/BANDAGES/DRESSINGS) ×1 IMPLANT
SUT MNCRL AB 3-0 PS2 18 (SUTURE) ×1 IMPLANT
SUT MNCRL AB 3-0 PS2 27 (SUTURE) ×1 IMPLANT
SUT STRATAFIX 0 PDS 27 VIOLET (SUTURE) ×1
SUT STRATAFIX PDO 1 14 VIOLET (SUTURE) ×1
SUT STRATFX PDO 1 14 VIOLET (SUTURE) ×1
SUT VIC AB 1 CT1 36 (SUTURE) IMPLANT
SUT VIC AB 2-0 CT2 27 (SUTURE) ×2 IMPLANT
SUTURE STRATFX 0 PDS 27 VIOLET (SUTURE) ×1 IMPLANT
SUTURE STRATFX PDO 1 14 VIOLET (SUTURE) ×1 IMPLANT
SYSTEM KNEE PART FEM LM SZ 4 (Joint) IMPLANT
SYSTEM KNEE PART TIB LM SZ E (Joint) IMPLANT
SYSTEM KNEE PART VE LM SZ E 9 (Joint) IMPLANT
TRAY FOLEY MTR SLVR 16FR STAT (SET/KITS/TRAYS/PACK) ×1 IMPLANT
TUBE SUCTION HIGH CAP CLEAR NV (SUCTIONS) ×1 IMPLANT
UNDERPAD 30X36 HEAVY ABSORB (UNDERPADS AND DIAPERS) ×1 IMPLANT
WRAP KNEE MAXI GEL POST OP (GAUZE/BANDAGES/DRESSINGS) ×1 IMPLANT

## 2023-05-05 NOTE — Progress Notes (Signed)
Orthopedic Tech Progress Note Patient Details:  Shelly [name] [birth year] [id number]  Dropped off bone foam in PACU with RN. Ortho Devices Type of Ortho Device: Bone foam zero knee Ortho Device/Splint Interventions: Floria Raveling 05/05/2023, 11:31 AM

## 2023-05-05 NOTE — Transfer of Care (Signed)
Immediate Anesthesia Transfer of Care Note  Patient: Shelly [name]  Procedure(s) Performed: UNICOMPARTMENTAL KNEE (Left: Knee)  Patient Location: PACU  Anesthesia Type:Spinal  Level of Consciousness: awake, alert , oriented, and patient cooperative  Airway & Oxygen Therapy: Patient Spontanous Breathing and Patient connected to face mask oxygen  Post-op Assessment: Report given to RN and Post -op Vital signs reviewed and stable  Post vital signs: Reviewed and stable  Last Vitals:  Vitals Value Taken Time  BP 106/61 05/05/23 1048  Temp 36.4 C 05/05/23 1048  Pulse 54 05/05/23 1053  Resp 12 05/05/23 1053  SpO2 100 % 05/05/23 1053  Vitals shown include unvalidated device data.  Last Pain:  Vitals:   05/05/23 0645  TempSrc: Oral  PainSc: 0-No pain         Complications: No notable events documented.

## 2023-05-05 NOTE — Anesthesia Procedure Notes (Signed)
Anesthesia Regional Block: Adductor canal block   Pre-Anesthetic Checklist: , timeout performed,  Correct Patient, Correct Site, Correct Laterality,  Correct Procedure, Correct Position, site marked,  Risks and benefits discussed,  Surgical consent,  Pre-op evaluation,  At surgeon's request and post-op pain management  Laterality: Left  Prep: chloraprep       Needles:  Injection technique: Single-shot  Needle Type: Stimiplex     Needle Length: 9cm  Needle Gauge: 21     Additional Needles:   Procedures:,,,, ultrasound used (permanent image in chart),,    Narrative:  Start time: 05/05/2023 8:15 AM End time: 05/05/2023 8:20 AM Injection made incrementally with aspirations every 5 mL.  Performed by: Personally  Anesthesiologist: Lowella Curb, MD

## 2023-05-05 NOTE — Anesthesia Postprocedure Evaluation (Signed)
Anesthesia Post Note  Patient: Shelly [name]  Procedure(s) Performed: UNICOMPARTMENTAL KNEE (Left: Knee)     Patient location during evaluation: PACU Anesthesia Type: Spinal Level of consciousness: awake and alert Pain management: pain level controlled Vital Signs Assessment: post-procedure vital signs reviewed and stable Respiratory status: spontaneous breathing, nonlabored ventilation and respiratory function stable Cardiovascular status: blood pressure returned to baseline and stable Postop Assessment: no apparent nausea or vomiting Anesthetic complications: no   No notable events documented.  Last Vitals:  Vitals:   05/05/23 1200 05/05/23 1215  BP: 124/73 127/71  Pulse: (!) 50 (!) 52  Resp: 14 15  Temp:    SpO2: 94% 93%    Last Pain:  Vitals:   05/05/23 1215  TempSrc:   PainSc: 0-No pain                 Lowella Curb

## 2023-05-05 NOTE — Anesthesia Preprocedure Evaluation (Signed)
Anesthesia Evaluation  Patient identified by MRN, date of birth, ID band Patient awake    Reviewed: Allergy & Precautions, H&P , NPO status , Patient's Chart, lab work & pertinent test results  History of Anesthesia Complications Negative for: history of anesthetic complications  Airway Mallampati: II  TM Distance: >3 FB Neck ROM: Full    Dental no notable dental hx.    Pulmonary asthma , former smoker   Pulmonary exam normal breath sounds clear to auscultation       Cardiovascular negative cardio ROS Normal cardiovascular exam Rhythm:Regular Rate:Normal     Neuro/Psych  Headaches  Anxiety Depression     negative psych ROS   GI/Hepatic Neg liver ROS,GERD  ,,  Endo/Other  negative endocrine ROS    Renal/GU negative Renal ROS  negative genitourinary   Musculoskeletal  (+) Arthritis , Osteoarthritis,    Abdominal   Peds negative pediatric ROS (+)  Hematology negative hematology ROS (+)   Anesthesia Other Findings   Reproductive/Obstetrics negative OB ROS                             Anesthesia Physical Anesthesia Plan  ASA: 2  Anesthesia Plan: Spinal   Post-op Pain Management: Tylenol PO (pre-op) and Regional block*   Induction:   PONV Risk Score and Plan: 2 and Propofol infusion, Treatment may vary due to age or medical condition, Ondansetron and TIVA  Airway Management Planned: Nasal Cannula and Simple Face Mask  Additional Equipment: None  Intra-op Plan:   Post-operative Plan:   Informed Consent: I have reviewed the patients History and Physical, chart, labs and discussed the procedure including the risks, benefits and alternatives for the proposed anesthesia with the patient or authorized representative who has indicated his/her understanding and acceptance.       Plan Discussed with:   Anesthesia Plan Comments:         Anesthesia Quick Evaluation

## 2023-05-05 NOTE — Evaluation (Signed)
Physical Therapy Evaluation Patient Details Name: Shelly [name] MRN: [id number] DOB: 11-Apr-1971 Today's Date: 05/05/2023  History of Present Illness  52 yo female presents to therapy s/p L medial unicompartmental knee arthoplasty on 05/05/2023 due to failure of conservative measures. 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  Clinical Impression    Shelly [name] is a 52 y.o. female POD 0 s/p L unicompartmental knee arthroplasty. Patient reports mod I with mobility at baseline. Patient is now limited by functional impairments (see PT problem list below) and requires min guard and cues for transfers and gait with RW. Patient was able to ambulate 45 feet  x 2 with RW and min guard and cues for safe walker management. Patient educated on safe sequencing for stair mobility, fall risk prevention, car transfers, pain management, use of cp/ice pt verbalized understanding of safe guarding position with functional mobility. Patient instructed in exercises to facilitate ROM and circulation reviewed and HO provided. Patient will benefit from continued skilled PT interventions to address impairments and progress towards PLOF. Patient has met mobility goals at adequate level for discharge home with family support and OPPT; will continue to follow if pt continues acute stay to progress towards Mod I goals.      Recommendations for follow up therapy are one component of a multi-disciplinary discharge planning process, led by the attending physician.  Recommendations may be updated based on patient status, additional functional criteria and insurance authorization.  Follow Up Recommendations       Assistance Recommended at Discharge Intermittent Supervision/Assistance  Patient can return home with the following  A little help with walking and/or transfers;A little help with  bathing/dressing/bathroom;Assistance with cooking/housework;Assist for transportation;Help with stairs or ramp for entrance    Equipment Recommendations None recommended by PT (pt reports DME in home setting)  Recommendations for Other Services       Functional Status Assessment Patient has had a recent decline in their functional status and demonstrates the ability to make significant improvements in function in a reasonable and predictable amount of time.     Precautions / Restrictions Precautions Precautions: Knee;Fall Restrictions Weight Bearing Restrictions: No      Mobility  Bed Mobility Overal bed mobility: Needs Assistance Bed Mobility: Supine to Sit     Supine to sit: Supervision     General bed mobility comments: min cues    Transfers Overall transfer level: Needs assistance Equipment used: Rolling walker (2 wheels) Transfers: Sit to/from Stand Sit to Stand: Min guard           General transfer comment: cues for proper UE placement for bed, recliner and commode transfers    Ambulation/Gait Ambulation/Gait assistance: Min guard Gait Distance (Feet): 45 Feet Assistive device: Rolling walker (2 wheels) Gait Pattern/deviations: Step-to pattern, Antalgic Gait velocity: decreased        Stairs Stairs: Yes Stairs assistance: Min guard Stair Management: Two rails Number of Stairs: 2 General stair comments: cues for proper sequencing, pt states uses RW to navigate steps in home setting, pt ed on use of SPC  Wheelchair Mobility    Modified Rankin (Stroke Patients Only)       Balance Overall balance assessment: Needs assistance Sitting-balance support: Feet supported Sitting balance-Leahy Scale: Good     Standing balance support: Bilateral upper extremity supported, During functional activity, Reliant on assistive device for balance Standing balance-Leahy Scale: Fair  Standing balance comment: static standing no UE support                              Pertinent Vitals/Pain Pain Assessment Pain Assessment: 0-10 Pain Score: 0-No pain Pain Location: L knee Pain Descriptors / Indicators:  (No pain report t/o eval) Pain Intervention(s): Limited activity within patient's tolerance, Monitored during session, Premedicated before session, Ice applied, Repositioned    Home Living Family/patient expects to be discharged to:: Private residence Living Arrangements: Spouse/significant other;Children Available Help at Discharge: Family Type of Home: House Home Access: Level entry       Home Layout: One level (2 steps in home reports using RW PLOF to navigate no handrial) Home Equipment: Agricultural consultant (2 wheels);Cane - single point      Prior Function Prior Level of Function : Independent/Modified Independent             Mobility Comments: mod I with intermittent use of SPC mod I for ADLs, self care tasks and IADLs       Hand Dominance        Extremity/Trunk Assessment        Lower Extremity Assessment Lower Extremity Assessment: LLE deficits/detail LLE Deficits / Details: ankle DF/PF 5/5; SLR , 10 degree lag LLE Sensation: decreased light touch;decreased proprioception (pt reports B proximal LE, buttokc and groin abn sensation)    Cervical / Trunk Assessment Cervical / Trunk Assessment:  (wfl)  Communication   Communication: No difficulties  Cognition Arousal/Alertness: Awake/alert Behavior During Therapy: WFL for tasks assessed/performed Overall Cognitive Status: Within Functional Limits for tasks assessed                                          General Comments      Exercises Total Joint Exercises Ankle Circles/Pumps: AROM, Both, 20 reps Quad Sets: AROM, Left, 5 reps Short Arc Quad: AROM, Left, 5 reps Heel Slides: AROM, Left, 5 reps Hip ABduction/ADduction: AROM, Left, 5 reps Straight Leg Raises: AROM, Left, 5 reps Knee Flexion: AROM, Left, 5 reps, Seated    Assessment/Plan    PT Assessment Patient needs continued PT services  PT Problem List Decreased strength;Decreased range of motion;Decreased activity tolerance;Decreased balance;Decreased mobility;Decreased coordination;Pain       PT Treatment Interventions DME instruction;Gait training;Stair training;Functional mobility training;Therapeutic activities;Therapeutic exercise;Balance training;Neuromuscular re-education;Patient/family education;Modalities    PT Goals (Current goals can be found in the Care Plan section)  Acute Rehab PT Goals Patient Stated Goal: to get back to dance and gardening PT Goal Formulation: With patient Potential to Achieve Goals: Good    Frequency 7X/week     Co-evaluation               AM-PAC PT "6 Clicks" Mobility  Outcome Measure Help needed turning from your back to your side while in a flat bed without using bedrails?: None Help needed moving from lying on your back to sitting on the side of a flat bed without using bedrails?: A Little Help needed moving to and from a bed to a chair (including a wheelchair)?: A Little Help needed standing up from a chair using your arms (e.g., wheelchair or bedside chair)?: A Little Help needed to walk in hospital room?: A Little Help needed climbing 3-5 steps with a railing? : A Little 6 Click Score: 19  End of Session Equipment Utilized During Treatment: Gait belt Activity Tolerance: Patient tolerated treatment well;No increased pain Patient left: in chair;with call bell/phone within reach Nurse Communication: Mobility status;Other (comment) (pt readiness for d/c from PT standpoint) PT Visit Diagnosis: Unsteadiness on feet (R26.81);Other abnormalities of gait and mobility (R26.89);Difficulty in walking, not elsewhere classified (R26.2);Pain Pain - Right/Left: Left Pain - part of body: Knee    Time: 7253-6644 PT Time Calculation (min) (ACUTE ONLY): 41 min   Charges:   PT Evaluation $PT Eval Low  Complexity: 1 Low PT Treatments $Gait Training: 8-22 mins $Therapeutic Exercise: 8-22 mins        Johnny Bridge, PT Acute Rehab   Shelly [name] 05/05/2023, 5:23 PM

## 2023-05-05 NOTE — Op Note (Signed)
05/05/2023  10:10 AM  PATIENT:  Shelly [name]    PRE-OPERATIVE DIAGNOSIS: Left end-stage medial compartment osteoarthritis  POST-OPERATIVE DIAGNOSIS:  Same  PROCEDURE:  Left Medial unicompartmental Knee Arthroplasty  SURGEON:  Makylee Sanborn A Janus Vlcek, MD  PHYSICIAN ASSISTANT: Kathie Dike, PA-C, present and scrubbed throughout the case, critical for completion in a timely fashion, and for retraction, instrumentation, and closure.  ANESTHESIA:   Spinal  ESTIMATED BLOOD LOSS: 25cc  PREOPERATIVE INDICATIONS:  Shelly [name] is a  52 y.o. female with a diagnosis of OA LEFT KNEE who failed conservative measures and elected for surgical management.    The risks benefits and alternatives were discussed with the patient preoperatively including but not limited to the risks of infection, bleeding, nerve injury, cardiopulmonary complications, blood clots, the need for revision surgery, among others, and the patient was willing to proceed.  OPERATIVE IMPLANTS: [employer] PPK fixed bearing medial compartment arthroplasty femur size 4, tibia size E, bearing size 9mm.  OPERATIVE FINDINGS: Endstage grade 4 anteromedial compartment osteoarthritis. No significant changes in the lateral or patellofemoral joint.  The ACL was intact.  OPERATIVE PROCEDURE:   Once adequate anesthesia, preoperative antibiotics, 2 gm of ancef,1 gm of Tranexamic Acid, and 8 mg of Decadron administered, the patient was positioned supine with a left thigh tourniquet placed.  The left lower extremity was prepped and draped in sterile fashion.  A time-  out was performed identifying the patient, planned procedure, and the appropriate extremity.   The leg was elevated and exsanguinated and the tourniquet was inflated. Anterior midline incision was performed. Medial Parapatellar incision was carried out, and the osteophytes were excised, along with the medial plateau and femoral condyle. Resected anterior horn of medial  meniscus and a small portion of the fat pad. Medial release was performed.  Remainder of the knee was evaluated.  ACL was intact lateral compartment with no significant wear.  Patellofemoral compartment with mild wear.  The extra medullary tibial cutting jig was applied, and resection was performed measuring 4 mm from the anterior medial defect.  Cut was made perpendicular to the axis of the tibia, matching the patient's native slope, and sagittal cut was made with appropriate rotation and just medial to the ACL insertion.    The proximal tibial bony cut was removed in one piece, and I turned my attention to the femur.  Rasp was used to clear debris from the corner of the cut.  We next turned our attention to the femur.  Distal femoral cutting block was put in place and pinned and the distal femur was cut.  The cutting guide was removed and the cut was completed with the knee in flexion.  We then sized the femur to be a 4 and then pinned the femur cutting guide into place taking care to appropriately lateralized and not overhang the component, medially or anteriorly.  The femur was drilled and the posterior and chamfer cuts were made.  With the knee in flexion medial meniscectomy was performed.  We next size of the tibia and found it to be a size E.  The tibial trial was then impacted into place in the right position pinned and the lugs were drilled.  The trial femoral component was then impacted, and a size 4m  liner trial was inserted.  Slight laxity in both extension and flexion upsized to the 9 mm insert With the trial liner in place we used the Amber sticks and had approximately 2 mm of laxity in  extension and 2 to 3 mm of laxity in flexion.  The femur also tracked appropriately on the center of the tibial component through flexion.   I then cemented the components into place, cementing the tibia first, removing all excess cement, and then cementing the femur.  All loose cement was  removed.  The trial liner was again inserted until cement had completely set.  The wounds were thoroughly irrigated with normal saline pulse lavage and injected with 20cc Exparel diluted with 30cc 0.25% marcaine with epi.  Once the cement was set again we assessed stability and balance which we felt was appropriate with a size 9mm liner.  The real polyethylene liner was opened and inserted.   The tourniquet was let down  No significant   hemostasis was required.  The medial parapatellar arthrotomy was then reapproximated using #1 Stratafix sutures with the knee  in flexion.  The   remaining wound was closed with 0 stratafix, 2-0 Vicryl, and running 3-0 Monocryl.   The knee was cleaned, dried, dressed sterilely using Dermabond and   Aquacel dressing.  The patient was then  brought to recovery room in stable condition, tolerating the procedure  well. There were no complications.   Post op recs: WB: WBAT Abx: ancef x23 hours post op Imaging: PACU xrays DVT prophylaxis: Aspirin 81mg  BID x4 weeks Follow up: 2 weeks after surgery for a wound check with Dr. Blanchie Dessert at [facility].  Address: [address], [place], [place] [address]  Office Phone: [phone]  Weber Cooks, MD Orthopaedic Surgery

## 2023-05-05 NOTE — Interval H&P Note (Signed)
The patient has been re-examined, and the chart reviewed, and there have been no interval changes to the documented history and physical.    Plan for L medial UKA for L medial compartment OA. TKA backup available if advanced lateral of PF compartment OA is present.  The operative side was examined and the patient was confirmed to have sensation to DPN, SPN, TN intact, Motor EHL, ext, flex 5/5, and DP 2+, PT 2+, No significant edema.   The risks, benefits, and alternatives have been discussed at length with patient, and the patient is willing to proceed.  Left knee marked. Consent has been signed.

## 2023-05-05 NOTE — Discharge Instructions (Signed)
INSTRUCTIONS AFTER JOINT REPLACEMENT  ° °Remove items at home which could result in a fall. This includes throw rugs or furniture in walking pathways °ICE to the affected joint every three hours while awake for 30 minutes at a time, for at least the first 3-5 days, and then as needed for pain and swelling.  Continue to use ice for pain and swelling. You may notice swelling that will progress down to the foot and ankle.  This is normal after surgery.  Elevate your leg when you are not up walking on it.   °Continue to use the breathing machine you got in the hospital (incentive spirometer) which will help keep your temperature down.  It is common for your temperature to cycle up and down following surgery, especially at night when you are not up moving around and exerting yourself.  The breathing machine keeps your lungs expanded and your temperature down. ° ° °DIET:  As you were doing prior to hospitalization, we recommend a well-balanced diet. ° °DRESSING / WOUND CARE / SHOWERING ° °Keep the surgical dressing until follow up.  The dressing is water proof, so you can shower without any extra covering.  IF THE DRESSING FALLS OFF or the wound gets wet inside, change the dressing with sterile gauze.  Please use good hand washing techniques before changing the dressing.  Do not use any lotions or creams on the incision until instructed by your surgeon.   ° °ACTIVITY ° °Increase activity slowly as tolerated, but follow the weight bearing instructions below.   °No driving for 6 weeks or until further direction given by your physician.  You cannot drive while taking narcotics.  °No lifting or carrying greater than 10 lbs. until further directed by your surgeon. °Avoid periods of inactivity such as sitting longer than an hour when not asleep. This helps prevent blood clots.  °You may return to work once you are authorized by your doctor.  ° ° ° °WEIGHT BEARING  ° °Weight bearing as tolerated with assist device (walker, cane,  etc) as directed, use it as long as suggested by your surgeon or therapist, typically at least 4-6 weeks. ° ° °EXERCISES ° °Results after joint replacement surgery are often greatly improved when you follow the exercise, range of motion and muscle strengthening exercises prescribed by your doctor. Safety measures are also important to protect the joint from further injury. Any time any of these exercises cause you to have increased pain or swelling, decrease what you are doing until you are comfortable again and then slowly increase them. If you have problems or questions, call your caregiver or physical therapist for advice.  ° °Rehabilitation is important following a joint replacement. After just a few days of immobilization, the muscles of the leg can become weakened and shrink (atrophy).  These exercises are designed to build up the tone and strength of the thigh and leg muscles and to improve motion. Often times heat used for twenty to thirty minutes before working out will loosen up your tissues and help with improving the range of motion but do not use heat for the first two weeks following surgery (sometimes heat can increase post-operative swelling).  ° °These exercises can be done on a training (exercise) mat, on the floor, on a table or on a bed. Use whatever works the best and is most comfortable for you.    Use music or television while you are exercising so that the exercises are a pleasant break in your   day. This will make your life better with the exercises acting as a break in your routine that you can look forward to.   Perform all exercises about fifteen times, three times per day or as directed.  You should exercise both the operative leg and the other leg as well. ° °Exercises include: °  °Quad Sets - Tighten up the muscle on the front of the thigh (Quad) and hold for 5-10 seconds.   °Straight Leg Raises - With your knee straight (if you were given a brace, keep it on), lift the leg to 60  degrees, hold for 3 seconds, and slowly lower the leg.  Perform this exercise against resistance later as your leg gets stronger.  °Leg Slides: Lying on your back, slowly slide your foot toward your buttocks, bending your knee up off the floor (only go as far as is comfortable). Then slowly slide your foot back down until your leg is flat on the floor again.  °Angel Wings: Lying on your back spread your legs to the side as far apart as you can without causing discomfort.  °Hamstring Strength:  Lying on your back, push your heel against the floor with your leg straight by tightening up the muscles of your buttocks.  Repeat, but this time bend your knee to a comfortable angle, and push your heel against the floor.  You may put a pillow under the heel to make it more comfortable if necessary.  ° °A rehabilitation program following joint replacement surgery can speed recovery and prevent re-injury in the future due to weakened muscles. Contact your doctor or a physical therapist for more information on knee rehabilitation.  ° ° °CONSTIPATION ° °Constipation is defined medically as fewer than three stools per week and severe constipation as less than one stool per week.  Even if you have a regular bowel pattern at home, your normal regimen is likely to be disrupted due to multiple reasons following surgery.  Combination of anesthesia, postoperative narcotics, change in appetite and fluid intake all can affect your bowels.  ° °YOU MUST use at least one of the following options; they are listed in order of increasing strength to get the job done.  They are all available over the counter, and you may need to use some, POSSIBLY even all of these options:   ° °Drink plenty of fluids (prune juice may be helpful) and high fiber foods °Colace 100 mg by mouth twice a day  °Senokot for constipation as directed and as needed Dulcolax (bisacodyl), take with full glass of water  °Miralax (polyethylene glycol) once or twice a day as  needed. ° °If you have tried all these things and are unable to have a bowel movement in the first 3-4 days after surgery call either your surgeon or your primary doctor.   ° °If you experience loose stools or diarrhea, hold the medications until you stool forms back up.  If your symptoms do not get better within 1 week or if they get worse, check with your doctor.  If you experience "the worst abdominal pain ever" or develop nausea or vomiting, please contact the office immediately for further recommendations for treatment. ° ° °ITCHING:  If you experience itching with your medications, try taking only a single pain pill, or even half a pain pill at a time.  You can also use Benadryl over the counter for itching or also to help with sleep.  ° °TED HOSE STOCKINGS:  Use stockings on both   legs until for at least 2 weeks or as directed by physician office. They may be removed at night for sleeping. ° °MEDICATIONS:  See your medication summary on the “After Visit Summary” that nursing will review with you.  You may have some home medications which will be placed on hold until you complete the course of blood thinner medication.  It is important for you to complete the blood thinner medication as prescribed. ° ° °Blood clot prevention (DVT Prophylaxis): After surgery you are at an increased risk for a blood clot. you were prescribed a blood thinner, Aspirin 81mg, to be taken twice daily for a total of 4 weeks from surgery to help reduce your risk of getting a blood clot. This will help prevent a blood clot. Signs of a pulmonary embolus (blood clot in the lungs) include sudden short of breath, feeling lightheaded or dizzy, chest pain with a deep breath, rapid pulse rapid breathing. Signs of a blood clot in your arms or legs include new unexplained swelling and cramping, warm, red or darkened skin around the painful area. Please call the office or 911 right away if these signs or symptoms develop. ° °PRECAUTIONS:  If you  experience chest pain or shortness of breath - call 911 immediately for transfer to the hospital emergency department.  ° °If you develop a fever greater that 101 F, purulent drainage from wound, increased redness or drainage from wound, foul odor from the wound/dressing, or calf pain - CONTACT YOUR SURGEON.   °                                                °FOLLOW-UP APPOINTMENTS:  If you do not already have a post-op appointment, please call the office for an appointment to be seen by your surgeon.  Guidelines for how soon to be seen are listed in your “After Visit Summary”, but are typically between 2-3 weeks after surgery. ° °OTHER INSTRUCTIONS:  ° °Knee Replacement:  Do not place pillow under knee, focus on keeping the knee straight while resting. CPM instructions: 0-90 degrees, 2 hours in the morning, 2 hours in the afternoon, and 2 hours in the evening. Place foam block, curve side up under heel at all times except when in CPM or when walking.  DO NOT modify, tear, cut, or change the foam block in any way. ° °POST-OPERATIVE OPIOID TAPER INSTRUCTIONS: °It is important to wean off of your opioid medication as soon as possible. If you do not need pain medication after your surgery it is ok to stop day one. °Opioids include: °Codeine, Hydrocodone(Norco, Vicodin), Oxycodone(Percocet, oxycontin) and hydromorphone amongst others.  °Long term and even short term use of opiods can cause: °Increased pain response °Dependence °Constipation °Depression °Respiratory depression °And more.  °Withdrawal symptoms can include °Flu like symptoms °Nausea, vomiting °And more °Techniques to manage these symptoms °Hydrate well °Eat regular healthy meals °Stay active °Use relaxation techniques(deep breathing, meditating, yoga) °Do Not substitute Alcohol to help with tapering °If you have been on opioids for less than two weeks and do not have pain than it is ok to stop all together.  °Plan to wean off of opioids °This plan should  start within one week post op of your joint replacement. °Maintain the same interval or time between taking each dose and first decrease the dose.  °Cut the   total daily intake of opioids by one tablet each day °Next start to increase the time between doses. °The last dose that should be eliminated is the evening dose.  ° °MAKE SURE YOU:  °Understand these instructions.  °Get help right away if you are not doing well or get worse.  ° ° °Thank you for letting us be a part of your medical care team.  It is a privilege we respect greatly.  We hope these instructions will help you stay on track for a fast and full recovery!  ° ° ° °  °  °

## 2023-05-05 NOTE — Anesthesia Procedure Notes (Signed)
Spinal  Patient location during procedure: OR Start time: 05/05/2023 8:30 AM End time: 05/05/2023 8:35 AM Reason for block: surgical anesthesia Staffing Performed: anesthesiologist  Anesthesiologist: Lowella Curb, MD Performed by: Lowella Curb, MD Authorized by: Lowella Curb, MD   Preanesthetic Checklist Completed: patient identified, IV checked, site marked, risks and benefits discussed, surgical consent, monitors and equipment checked, pre-op evaluation and timeout performed Spinal Block Patient position: sitting Prep: DuraPrep Patient monitoring: heart rate, cardiac monitor, continuous pulse ox and blood pressure Approach: midline Location: L3-4 Injection technique: single-shot Needle Needle type: Sprotte  Needle gauge: 24 G Needle length: 9 cm Assessment Sensory level: T4 Events: CSF return and second provider Additional Notes First attempt by CRNA

## 2023-05-06 ENCOUNTER — Encounter (HOSPITAL_COMMUNITY): Payer: Self-pay | Admitting: Orthopedic Surgery

## 2023-05-31 ENCOUNTER — Ambulatory Visit (INDEPENDENT_AMBULATORY_CARE_PROVIDER_SITE_OTHER): Payer: No Typology Code available for payment source | Admitting: Neurology

## 2023-05-31 ENCOUNTER — Encounter: Payer: Self-pay | Admitting: Neurology

## 2023-05-31 VITALS — BP 136/82 | HR 92 | Ht 67.0 in | Wt 212.0 lb

## 2023-05-31 DIAGNOSIS — G4719 Other hypersomnia: Secondary | ICD-10-CM | POA: Diagnosis not present

## 2023-05-31 DIAGNOSIS — R002 Palpitations: Secondary | ICD-10-CM

## 2023-05-31 DIAGNOSIS — Z820 Family history of epilepsy and other diseases of the nervous system: Secondary | ICD-10-CM

## 2023-05-31 DIAGNOSIS — R0689 Other abnormalities of breathing: Secondary | ICD-10-CM

## 2023-05-31 DIAGNOSIS — R0681 Apnea, not elsewhere classified: Secondary | ICD-10-CM | POA: Diagnosis not present

## 2023-05-31 DIAGNOSIS — Z9189 Other specified personal risk factors, not elsewhere classified: Secondary | ICD-10-CM

## 2023-05-31 DIAGNOSIS — R0683 Snoring: Secondary | ICD-10-CM | POA: Diagnosis not present

## 2023-05-31 NOTE — Patient Instructions (Signed)

## 2023-05-31 NOTE — Progress Notes (Signed)
Subjective:    Patient ID: Shelly [name] is a 52 y.o. female.  HPI    Huston Foley, MD, PhD [facility] [address], [address] [address] [place], [place] [address]  Dear Dr. [place],  I saw your patient, Shelly [name], upon your kind request in my sleep clinic today for initial consultation of her sleep disorder, in particular, concern for underlying obstructive sleep apnea.  The patient is unaccompanied today.  As you know, [name] is a 52 year old female with an underlying medical history of allergies, asthma, reflux disease, anemia, anxiety, depression, palpitations, migraine headaches, carpal tunnel syndrome, arthritis with status post left partial knee replacement on 05/05/2023, and obesity, who reports snoring and excessive daytime somnolence as well as waking up with a sense of gasping for air.  Her husband has noticed pauses in her breathing while she is asleep.  Her Epworth sleepiness score is 15 out of 24, fatigue severity score is 35 out of 63.  Her dad had sleep apnea and had a BiPAP machine.  She reports that he also had COPD/emphysema. I reviewed your office note from 03/10/2023.  Her weight has been more or less stable.  She is not back to work yet since her knee surgery, she does not take any pain medication at this time and does not have any significant pain at night, some discomfort.  She has had trouble falling asleep and staying asleep since her knee surgery.  She takes CBD at night, it is a gel pill.  She has tried melatonin in the past and noticed that she had to increase her dose and it no longer works for her.  Bedtime is generally between 12 and 1 currently and rise time around 630, as late as 9 AM.  When she works, she has to be at work at 6 and works till [employer] PM.  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  Bedtime is earlier when she is working and rise time is 5 AM.  She denies nightly nocturia or  recurrent nocturnal or morning headaches.  She drinks caffeine in the form of coffee, about 2 cups in the mornings, no daily soda or tea.  She drinks alcohol occasionally, about 3 glasses of wine per week.  She quit smoking some 30 years ago.  She has a history of asthma and has a rescue inhaler, no maintenance inhaler.  She has 3 children, 2 of them are at home, ages 71 and 38.  She lives with her husband and they also have 2 dogs in the household, the dogs do not sleep in the bedroom with them.  She does not watch TV in her bedroom.  Her Past Medical History Is Significant For: 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 08/2021   Carpal tunnel syndrome    Right   Depression    Dysrhythmia    recent palpitations   GERD (gastroesophageal reflux disease)    Intermittent palpitations    Loose body of right knee    Migraines    Osteochondral defect    right knee    Her Past Surgical History Is Significant For: Past Surgical History:  Procedure Laterality Date   CESAREAN SECTION  06-03-2011//  09-27-2003//  2000   Bilateral Tubal  Ligation w/ last c/s   CHONDROPLASTY Right 06/11/2015   Procedure: CHONDROPLASTY;  Surgeon: Eugenia Mcalpine, MD;  Location: [facility];  Service: Orthopedics;  Laterality: Right;   DILATION AND CURETTAGE OF UTERUS     FOREIGN BODY REMOVAL Right 06/11/2015   Procedure: RIGHT REMOVAL LOOSE BODY;  Surgeon: Eugenia Mcalpine, MD;  Location: [facility];  Service: Orthopedics;  Laterality: Right;   KNEE ARTHROSCOPY Right 11/23/1988   KNEE ARTHROSCOPY Right 06/11/2015   Procedure: RIGHT ARTHROSCOPY KNEE;  Surgeon: Eugenia Mcalpine, MD;  Location: [facility];  Service: Orthopedics;  Laterality: Right;   LEFT WRIST REDUCTION AND FUSION   07/19/2008   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   PARTIAL KNEE  ARTHROPLASTY Right 09/02/2015   Procedure: RIGHT UNI KNEE ARTHROPLASTY MEDIALLY;  Surgeon: Durene Romans, MD;  Location: [facility];  Service: Orthopedics;  Laterality: Right;   PARTIAL KNEE ARTHROPLASTY Left 05/05/2023   Procedure: UNICOMPARTMENTAL KNEE;  Surgeon: Joen Laura, MD;  Location: [facility];  Service: Orthopedics;  Laterality: Left;   TOTAL KNEE REVISION Right 03/20/2016   Procedure: RIGHT UNI KNEE POLY EXCHANGE;  Surgeon: Durene Romans, MD;  Location: [facility];  Service: Orthopedics;  Laterality: Right;   TOTAL KNEE REVISION WITH SCAR DEBRIDEMENT/PATELLA REVISION WITH POLY EXCHANGE Right 11/11/2021   Procedure: Right unicompartmental knee arthroplasty poly revision;  Surgeon: Durene Romans, MD;  Location: [facility];  Service: Orthopedics;  Laterality: Right;   TUBAL LIGATION     WISDOM TOOTH EXTRACTION Bilateral     Her Family History Is Significant For: Family History  Problem Relation Age of Onset   Arthritis Mother    COPD Father    Emphysema Father    Sleep apnea Father    Alcohol abuse Paternal Grandmother    Cancer Paternal Grandmother     Her Social History Is Significant For: Social History   Socioeconomic History   Marital status: Married    Spouse name: Not on file   Number of children: Not on file   Years of education: Not on file   Highest education level: Not on file  Occupational History   Not on file  Tobacco Use   Smoking status: Former    Years: 6    Types: Cigarettes    Quit date: 06/10/1995    Years since quitting: 27.9   Smokeless tobacco: Never  Vaping Use   Vaping Use: Never used  Substance and Sexual Activity   Alcohol use: Yes    Alcohol/week: 0.0 standard drinks of alcohol    Comment: occasional   Drug use: No   Sexual activity: Yes    Partners: Male    Birth control/protection: Surgical  Other Topics Concern   Not on file  Social History Narrative   Not on file   Social Determinants of Health   Financial Resource Strain:  Patient Declined (03/09/2023)   Overall Financial Resource Strain (CARDIA)    Difficulty of Paying Living Expenses: Patient declined  Food Insecurity: Patient Declined (03/09/2023)   Hunger Vital Sign    Worried About Running Out of Food in the Last Year: Patient declined    Ran Out of Food in the Last Year: Patient declined  Transportation Needs: Patient Declined (03/09/2023)   PRAPARE - Administrator, Civil Service (Medical): Patient declined    Lack of Transportation (Non-Medical): Patient declined  Physical Activity: Insufficiently Active (03/09/2023)   Exercise Vital Sign    Days of Exercise per Week:  2 days    Minutes of Exercise per Session: 40 min  Stress: Stress Concern Present (03/09/2023)   [employer] of Occupational Health - Occupational Stress Questionnaire    Feeling of Stress : Very much  Social Connections: Unknown (03/09/2023)   Social Connection and Isolation Panel [NHANES]    Frequency of Communication with Friends and Family: Patient declined    Frequency of Social Gatherings with Friends and Family: Patient declined    Attends Religious Services: Patient declined    Database administrator or Organizations: Patient declined    Attends Banker Meetings: Not on file    Marital Status: Patient declined    Her Allergies Are:  No Known Allergies:   Her Current Medications Are:  Outpatient Encounter Medications as of 05/31/2023  Medication Sig   acetaminophen (TYLENOL) 500 MG tablet Take 2 tablets (1,000 mg total) by mouth every 8 (eight) hours as needed.   aspirin EC 81 MG tablet Take 1 tablet (81 mg total) by mouth 2 (two) times daily for 28 days. Swallow whole.   cetirizine (ZYRTEC) 10 MG tablet Take 10 mg by mouth in the morning.   FLUoxetine (PROZAC) 20 MG capsule TAKE 1 CAPSULE BY MOUTH EVERY DAY   meloxicam (MOBIC) 15 MG tablet Take 1 tablet (15 mg total) by mouth daily.   NON FORMULARY Take 1 capsule by mouth See admin instructions.  CBD gelcap- Take 1 gelcap by mouth prior to bed every night   omeprazole (PRILOSEC OTC) 20 MG tablet Take 20 mg by mouth daily before breakfast.   polyethylene glycol (MIRALAX) 17 g packet Take 17 g by mouth daily.   PROAIR HFA 108 (90 Base) MCG/ACT inhaler Inhale 1-2 puffs into the lungs every 6 (six) hours as needed for wheezing or shortness of breath.   [DISCONTINUED] triamcinolone cream (KENALOG) 0.1 % Apply 1 Application topically 2 (two) times daily as needed. (Patient not taking: Reported on 05/31/2023)   No facility-administered encounter medications on file as of 05/31/2023.  :   Review of Systems:  Out of a complete 14 point review of systems, all are reviewed and negative with the exception of these symptoms as listed below:  Review of Systems  Neurological:        Rm 9 alone Pt is well, reports she gets about 6 hrs of sleep a night. She wakes up very fatigued and can have a increase in daytime fatigue. She has had witnessed apnea. Father was diagnosed with OSA.     Objective:  Neurological Exam  Physical Exam Physical Examination:   Vitals:   05/31/23 1239  BP: 136/82  Pulse: 92    General Examination: The patient is a very pleasant 52 y.o. female in no acute distress. She appears well-developed and well-nourished and well groomed.   HEENT: Normocephalic, atraumatic, pupils are equal, round and reactive to light, corrective eyeglasses in place.  Extraocular tracking is good without limitation to gaze excursion or nystagmus noted. Hearing is grossly intact. Face is symmetric with normal facial animation. Speech is clear with no dysarthria noted. There is no hypophonia. There is no lip, neck/head, jaw or voice tremor. Neck is supple with full range of passive and active motion. There are no carotid bruits on auscultation. Oropharynx exam reveals: No significant mouth dryness, good dental hygiene, moderate airway crowding secondary to small airway entry, tonsillar size about 1+  bilaterally, Mallampati class I.  Neck circumference 14-1/8 inches, mild overbite noted.  Tongue protrudes centrally and  palate elevates symmetrically.   Chest: Clear to auscultation without wheezing, rhonchi or crackles noted.  Heart: S1+S2+0, regular and normal without murmurs, rubs or gallops noted.   Abdomen: Soft, non-tender and non-distended.  Extremities: There is no obvious swelling around the ankles.    Skin: Warm and dry without trophic changes noted.   Musculoskeletal: exam reveals no obvious joint deformities.  Good range of motion left knee.  Neurologically:  Mental status: The patient is awake, alert and oriented in all 4 spheres. Her immediate and remote memory, attention, language skills and fund of knowledge are appropriate. There is no evidence of aphasia, agnosia, apraxia or anomia. Speech is clear with normal prosody and enunciation. Thought process is linear. Mood is normal and affect is normal.  Cranial nerves II - XII are as described above under HEENT exam.  Motor exam: Normal bulk, moves all 4 extremities without difficulty. No obvious action or resting tremor.  Fine motor skills and coordination: grossly intact.  Cerebellar testing: No dysmetria or intention tremor. There is no truncal or gait ataxia.  Sensory exam: intact to light touch in the upper and lower extremities.  Gait, station and balance: She stands easily. No veering to one side is noted. No leaning to one side is noted. Posture is age-appropriate and stance is narrow based. Gait shows normal stride length and normal pace. No problems turning are noted.   Assessment and Plan:   In summary, Azarria JAYLEANA FIDANZA is a very pleasant 52 y.o.-year old female with an underlying medical history of allergies, asthma, reflux disease, anemia, anxiety, depression, palpitations, migraine headaches, carpal tunnel syndrome, arthritis with status post left partial knee replacement on 05/05/2023, and obesity, whose history  and physical exam are concerning for sleep disordered breathing, particularly obstructive sleep apnea (OSA).  While a laboratory attended sleep study is typically considered "gold standard" for evaluation of sleep disordered breathing, we mutually agreed to proceed with a home sleep test at this time.   I had a long chat with the patient about my findings and the diagnosis of sleep apnea, particularly OSA, its prognosis and treatment options. We talked about medical/conservative treatments, surgical interventions and non-pharmacological approaches for symptom control. I explained, in particular, the risks and ramifications of untreated moderate to severe OSA, especially with respect to developing cardiovascular disease down the road, including congestive heart failure (CHF), difficult to treat hypertension, cardiac arrhythmias (particularly A-fib), neurovascular complications including TIA, stroke and dementia. Even type 2 diabetes has, in part, been linked to untreated OSA. Symptoms of untreated OSA may include (but may not be limited to) daytime sleepiness, nocturia (i.e. frequent nighttime urination), memory problems, mood irritability and suboptimally controlled or worsening mood disorder such as depression and/or anxiety, lack of energy, lack of motivation, physical discomfort, as well as recurrent headaches, especially morning or nocturnal headaches. We talked about the importance of maintaining a healthy lifestyle and striving for healthy weight. In addition, we talked about the importance of striving for and maintaining good sleep hygiene. I recommended a sleep study at this time. I outlined the differences between a laboratory attended sleep study which is considered more comprehensive and accurate over the option of a home sleep test (HST); the latter may lead to underestimation of sleep disordered breathing in some instances and does not help with diagnosing upper airway resistance syndrome and is not  accurate enough to diagnose primary central sleep apnea typically. I outlined possible surgical and non-surgical treatment options of OSA, including the use of  a positive airway pressure (PAP) device (i.e. CPAP, AutoPAP/APAP or BiPAP in certain circumstances), a custom-made dental device (aka oral appliance, which would require a referral to a specialist dentist or orthodontist typically, and is generally speaking not considered for patients with full dentures or edentulous state), upper airway surgical options, such as traditional UPPP (which is not considered a first-line treatment) or the Inspire device (hypoglossal nerve stimulator, which would involve a referral for consultation with an ENT surgeon, after careful selection, following inclusion criteria - also not first-line treatment). I explained the PAP treatment option to the patient in detail, as this is generally considered first-line treatment.  The patient indicated that she would be willing to try PAP therapy, if the need arises. I explained the importance of being compliant with PAP treatment, not only for insurance purposes but primarily to improve patient's symptoms symptoms, and for the patient's long term health benefit, including to reduce Her cardiovascular risks longer-term.    We will pick up our discussion about the next steps and treatment options after testing.  We will keep her posted as to the test results by phone call and/or MyChart messaging where possible.  We will plan to follow-up in sleep clinic accordingly as well.  I answered all her questions today and the patient was in agreement.   I encouraged her to call with any interim questions, concerns, problems or updates or email [place] through MyChart.  Generally speaking, sleep test authorizations may take up to 2 weeks, sometimes less, sometimes longer, the patient is encouraged to get in touch with [place] if they do not hear back from the sleep lab staff directly within the next 2  weeks.  Thank you very much for allowing me to participate in the care of this nice patient. If I can be of any further assistance to you please do not hesitate to call me at [phone].  Sincerely,   Huston Foley, MD, PhD

## 2023-07-19 NOTE — Progress Notes (Unsigned)
HPI: Shelly [name] is a 52 y.o. female, who is here today for her routine physical.  Last CPE: 07/22/22  Regular exercise 3 or more time per week: *** Following a healthy diet: ***  Chronic medical problems: Depression,insomnia, GERD,asthma, anxiety, seasonal allergies among some.   Depression and anxiety on Fluoxetine 20 mg daily. Medication is still helping with symptoms. No side effects. Immunization History  Administered Date(s) Administered   Td 08/20/2008   Tdap 08/20/2008, 07/22/2022   Zoster Recombinant(Shingrix) 07/22/2022   Health Maintenance  Topic Date Due   Colonoscopy  Never done   PAP SMEAR-Modifier  05/06/2018   COVID-19 Vaccine (1 - 2023-24 season) Never done   Zoster Vaccines- Shingrix (2 of 2) 09/16/2022   INFLUENZA VACCINE  06/24/2023   MAMMOGRAM  07/23/2023 (Originally 12/22/2021)   DTaP/Tdap/Td (4 - Td or Tdap) 07/22/2032   Hepatitis C Screening  Completed   HIV Screening  Completed   HPV VACCINES  Aged Out    She has *** concerns today.  Review of Systems  Current Outpatient Medications on File Prior to Visit  Medication Sig Dispense Refill   cetirizine (ZYRTEC) 10 MG tablet Take 10 mg by mouth in the morning.     FLUoxetine (PROZAC) 20 MG capsule TAKE 1 CAPSULE BY MOUTH EVERY DAY 90 capsule 1   meloxicam (MOBIC) 15 MG tablet Take 1 tablet (15 mg total) by mouth daily. 30 tablet 2   NON FORMULARY Take 1 capsule by mouth See admin instructions. CBD gelcap- Take 1 gelcap by mouth prior to bed every night     omeprazole (PRILOSEC OTC) 20 MG tablet Take 20 mg by mouth daily before breakfast.     polyethylene glycol (MIRALAX) 17 g packet Take 17 g by mouth daily. 14 each 0   PROAIR HFA 108 (90 Base) MCG/ACT inhaler Inhale 1-2 puffs into the lungs every 6 (six) hours as needed for wheezing or shortness of breath.     No current facility-administered medications on file prior to visit.    Past Medical History:  Diagnosis Date   Anemia     borderline   Anxiety    Arthritis    Asthma    Benign paroxysmal positional vertigo due to bilateral vestibular disorder 08/2021   Carpal tunnel syndrome    Right   Depression    Dysrhythmia    recent palpitations   GERD (gastroesophageal reflux disease)    Intermittent palpitations    Loose body of right knee    Migraines    Osteochondral defect    right knee    Past Surgical History:  Procedure Laterality Date   CESAREAN SECTION  06-03-2011//  09-27-2003//  2000   Bilateral Tubal Ligation w/ last c/s   CHONDROPLASTY Right 06/11/2015   Procedure: CHONDROPLASTY;  Surgeon: Eugenia Mcalpine, MD;  Location: [facility];  Service: Orthopedics;  Laterality: Right;   DILATION AND CURETTAGE OF UTERUS     FOREIGN BODY REMOVAL Right 06/11/2015   Procedure: RIGHT REMOVAL LOOSE BODY;  Surgeon: Eugenia Mcalpine, MD;  Location: [facility];  Service: Orthopedics;  Laterality: Right;   KNEE ARTHROSCOPY Right 11/23/1988   KNEE ARTHROSCOPY Right 06/11/2015   Procedure: RIGHT ARTHROSCOPY KNEE;  Surgeon: Eugenia Mcalpine, MD;  Location: [facility];  Service: Orthopedics;  Laterality: Right;   LEFT WRIST REDUCTION AND FUSION   07/19/2008   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   PARTIAL KNEE ARTHROPLASTY Right 09/02/2015   Procedure: RIGHT  UNI KNEE ARTHROPLASTY MEDIALLY;  Surgeon: Durene Romans, MD;  Location: [facility];  Service: Orthopedics;  Laterality: Right;   PARTIAL KNEE ARTHROPLASTY Left 05/05/2023   Procedure: UNICOMPARTMENTAL KNEE;  Surgeon: Joen Laura, MD;  Location: [facility];  Service: Orthopedics;  Laterality: Left;   TOTAL KNEE REVISION Right 03/20/2016   Procedure: RIGHT UNI KNEE POLY EXCHANGE;  Surgeon: Durene Romans, MD;  Location: [facility];  Service: Orthopedics;  Laterality: Right;   TOTAL KNEE REVISION WITH SCAR DEBRIDEMENT/PATELLA REVISION WITH POLY EXCHANGE Right 11/11/2021   Procedure: Right unicompartmental knee arthroplasty poly revision;   Surgeon: Durene Romans, MD;  Location[facility];  Service: Orthopedics;  Laterality: Right;   TUBAL LIGATION     WISDOM TOOTH EXTRACTION Bilateral     No Known Allergies  Family History  Problem Relation Age of Onset   Arthritis Mother    COPD Father    Emphysema Father    Sleep apnea Father    Alcohol abuse Paternal Grandmother    Cancer Paternal Grandmother     Social History   Socioeconomic History   Marital status: Married    Spouse name: Not on file   Number of children: Not on file   Years of education: Not on file   Highest education level: Not on file  Occupational History   Not on file  Tobacco Use   Smoking status: Former    Current packs/day: 0.00    Types: Cigarettes    Start date: 06/09/1989    Quit date: 06/10/1995    Years since quitting: 28.1   Smokeless tobacco: Never  Vaping Use   Vaping status: Never Used  Substance and Sexual Activity   Alcohol use: Yes    Alcohol/week: 0.0 standard drinks of alcohol    Comment: occasional   Drug use: No   Sexual activity: Yes    Partners: Male    Birth control/protection: Surgical  Other Topics Concern   Not on file  Social History Narrative   Not on file   Social Determinants of Health   Financial Resource Strain: Patient Declined (03/09/2023)   Overall Financial Resource Strain (CARDIA)    Difficulty of Paying Living Expenses: Patient declined  Food Insecurity: Patient Declined (03/09/2023)   Hunger Vital Sign    Worried About Running Out of Food in the Last Year: Patient declined    Ran Out of Food in the Last Year: Patient declined  Transportation Needs: Patient Declined (03/09/2023)   PRAPARE - Administrator, Civil Service (Medical): Patient declined    Lack of Transportation (Non-Medical): Patient declined  Physical Activity: Insufficiently Active (03/09/2023)   Exercise Vital Sign    Days of Exercise per Week: 2 days    Minutes of Exercise per Session: 40 min  Stress: Stress Concern  Present (03/09/2023)   [employer] of Occupational Health - Occupational Stress Questionnaire    Feeling of Stress : Very much  Social Connections: Unknown (03/09/2023)   Social Connection and Isolation Panel [NHANES]    Frequency of Communication with Friends and Family: Patient declined    Frequency of Social Gatherings with Friends and Family: Patient declined    Attends Religious Services: Patient declined    Database administrator or Organizations: Patient declined    Attends Banker Meetings: Not on file    Marital Status: Patient declined    There were no vitals filed for this visit. There is no height or weight on file to calculate  BMI.  Wt Readings from Last 3 Encounters:  05/31/23 212 lb (96.2 kg)  05/05/23 209 lb 7 oz (95 kg)  04/22/23 210 lb (95.3 kg)    Physical Exam  ASSESSMENT AND PLAN: Ms. CACEE FLOWERS was here today annual physical examination.  No orders of the defined types were placed in this encounter.   There are no diagnoses linked to this encounter.  There are no diagnoses linked to this encounter.  No follow-ups on file.  Shatima Zalar G. [place], MD  [facility]. [place] office.

## 2023-07-20 ENCOUNTER — Encounter: Payer: No Typology Code available for payment source | Admitting: Family Medicine

## 2023-07-20 ENCOUNTER — Ambulatory Visit: Payer: No Typology Code available for payment source | Admitting: Family Medicine

## 2023-07-20 ENCOUNTER — Encounter: Payer: Self-pay | Admitting: Family Medicine

## 2023-07-20 ENCOUNTER — Ambulatory Visit: Payer: No Typology Code available for payment source | Admitting: Neurology

## 2023-07-20 VITALS — BP 126/74 | HR 85 | Temp 98.8°F | Resp 12 | Ht 67.0 in | Wt 208.0 lb

## 2023-07-20 DIAGNOSIS — R7303 Prediabetes: Secondary | ICD-10-CM

## 2023-07-20 DIAGNOSIS — Z0000 Encounter for general adult medical examination without abnormal findings: Secondary | ICD-10-CM | POA: Insufficient documentation

## 2023-07-20 DIAGNOSIS — R0683 Snoring: Secondary | ICD-10-CM

## 2023-07-20 DIAGNOSIS — E782 Mixed hyperlipidemia: Secondary | ICD-10-CM | POA: Diagnosis not present

## 2023-07-20 DIAGNOSIS — R002 Palpitations: Secondary | ICD-10-CM

## 2023-07-20 DIAGNOSIS — Z820 Family history of epilepsy and other diseases of the nervous system: Secondary | ICD-10-CM

## 2023-07-20 DIAGNOSIS — F419 Anxiety disorder, unspecified: Secondary | ICD-10-CM

## 2023-07-20 DIAGNOSIS — G4733 Obstructive sleep apnea (adult) (pediatric): Secondary | ICD-10-CM

## 2023-07-20 DIAGNOSIS — R0689 Other abnormalities of breathing: Secondary | ICD-10-CM

## 2023-07-20 DIAGNOSIS — F3341 Major depressive disorder, recurrent, in partial remission: Secondary | ICD-10-CM

## 2023-07-20 DIAGNOSIS — G4719 Other hypersomnia: Secondary | ICD-10-CM

## 2023-07-20 DIAGNOSIS — R0681 Apnea, not elsewhere classified: Secondary | ICD-10-CM

## 2023-07-20 DIAGNOSIS — L299 Pruritus, unspecified: Secondary | ICD-10-CM | POA: Diagnosis not present

## 2023-07-20 DIAGNOSIS — Z9189 Other specified personal risk factors, not elsewhere classified: Secondary | ICD-10-CM

## 2023-07-20 MED ORDER — TRIAMCINOLONE 0.1 % CREAM:EUCERIN CREAM 1:1
1.0000 | TOPICAL_CREAM | Freq: Two times a day (BID) | CUTANEOUS | 1 refills | Status: DC | PRN
Start: 2023-07-20 — End: 2023-09-15

## 2023-07-20 NOTE — Assessment & Plan Note (Signed)
We discussed the importance of regular physical activity and healthy diet for prevention of chronic illness and/or complications. Preventive guidelines reviewed. Vaccination: She prefers to hold on Shingrix. Ca++ and vit D supplementation to continue. She is overdue for breast cancer and cervical cancer screening, strongly recommend arranging appointment with her gynecologist. Next CPE in a year.

## 2023-07-20 NOTE — Assessment & Plan Note (Signed)
Non pharmacologic treatment recommended for now. Further recommendations will be given according to 10 years CVD risk score and lipid panel numbers. 

## 2023-07-20 NOTE — Patient Instructions (Addendum)
A few things to remember from today's visit:  Routine general medical examination at a health care facility  Hyperlipidemia, mixed - Plan: Basic metabolic panel, Lipid panel  Prediabetes - Plan: Basic metabolic panel, Hemoglobin A1c  Anxiety disorder, unspecified type  Remember to arrange appt with gynecologist for pap and mammogram.  If you need refills for medications you take chronically, please call your pharmacy. Do not use My Chart to request refills or for acute issues that need immediate attention. If you send a my chart message, it may take a few days to be addressed, specially if I am not in the office.  Please be sure medication list is accurate. If a new problem present, please set up appointment sooner than planned today.  Health Maintenance, Female Adopting a healthy lifestyle and getting preventive care are important in promoting health and wellness. Ask your health care provider about: The right schedule for you to have regular tests and exams. Things you can do on your own to prevent diseases and keep yourself healthy. What should I know about diet, weight, and exercise? Eat a healthy diet  Eat a diet that includes plenty of vegetables, fruits, low-fat dairy products, and lean protein. Do not eat a lot of foods that are high in solid fats, added sugars, or sodium. Maintain a healthy weight Body mass index (BMI) is used to identify weight problems. It estimates body fat based on height and weight. Your health care provider can help determine your BMI and help you achieve or maintain a healthy weight. Get regular exercise Get regular exercise. This is one of the most important things you can do for your health. Most adults should: Exercise for at least 150 minutes each week. The exercise should increase your heart rate and make you sweat (moderate-intensity exercise). Do strengthening exercises at least twice a week. This is in addition to the moderate-intensity  exercise. Spend less time sitting. Even light physical activity can be beneficial. Watch cholesterol and blood lipids Have your blood tested for lipids and cholesterol at 51 years of age, then have this test every 5 years. Have your cholesterol levels checked more often if: Your lipid or cholesterol levels are high. You are older than 51 years of age. You are at high risk for heart disease. What should I know about cancer screening? Depending on your health history and family history, you may need to have cancer screening at various ages. This may include screening for: Breast cancer. Cervical cancer. Colorectal cancer. Skin cancer. Lung cancer. What should I know about heart disease, diabetes, and high blood pressure? Blood pressure and heart disease High blood pressure causes heart disease and increases the risk of stroke. This is more likely to develop in people who have high blood pressure readings or are overweight. Have your blood pressure checked: Every 3-5 years if you are 23-62 years of age. Every year if you are 37 years old or older. Diabetes Have regular diabetes screenings. This checks your fasting blood sugar level. Have the screening done: Once every three years after age 72 if you are at a normal weight and have a low risk for diabetes. More often and at a younger age if you are overweight or have a high risk for diabetes. What should I know about preventing infection? Hepatitis B If you have a higher risk for hepatitis B, you should be screened for this virus. Talk with your health care provider to find out if you are at risk for hepatitis  B infection. Hepatitis C Testing is recommended for: Everyone born from [birth year] through 1964. Anyone with known risk factors for hepatitis C. Sexually transmitted infections (STIs) Get screened for STIs, including gonorrhea and chlamydia, if: You are sexually active and are younger than 51 years of age. You are older than 51 years  of age and your health care provider tells you that you are at risk for this type of infection. Your sexual activity has changed since you were last screened, and you are at increased risk for chlamydia or gonorrhea. Ask your health care provider if you are at risk. Ask your health care provider about whether you are at high risk for HIV. Your health care provider may recommend a prescription medicine to help prevent HIV infection. If you choose to take medicine to prevent HIV, you should first get tested for HIV. You should then be tested every 3 months for as long as you are taking the medicine. Pregnancy If you are about to stop having your period (premenopausal) and you may become pregnant, seek counseling before you get pregnant. Take 400 to 800 micrograms (mcg) of folic acid every day if you become pregnant. Ask for birth control (contraception) if you want to prevent pregnancy. Osteoporosis and menopause Osteoporosis is a disease in which the bones lose minerals and strength with aging. This can result in bone fractures. If you are 8 years old or older, or if you are at risk for osteoporosis and fractures, ask your health care provider if you should: Be screened for bone loss. Take a calcium or vitamin D supplement to lower your risk of fractures. Be given hormone replacement therapy (HRT) to treat symptoms of menopause. Follow these instructions at home: Alcohol use Do not drink alcohol if: Your health care provider tells you not to drink. You are pregnant, may be pregnant, or are planning to become pregnant. If you drink alcohol: Limit how much you have to: 0-1 drink a day. Know how much alcohol is in your drink. In the [place] one drink equals one 12 oz bottle of beer (355 mL), one 5 oz glass of wine (148 mL), or one 1 oz glass of hard liquor (44 mL). Lifestyle Do not use any products that contain nicotine or tobacco. These products include cigarettes, chewing tobacco, and vaping  devices, such as e-cigarettes. If you need help quitting, ask your health care provider. Do not use street drugs. Do not share needles. Ask your health care provider for help if you need support or information about quitting drugs. General instructions Schedule regular health, dental, and eye exams. Stay current with your vaccines. Tell your health care provider if: You often feel depressed. You have ever been abused or do not feel safe at home. Summary Adopting a healthy lifestyle and getting preventive care are important in promoting health and wellness. Follow your health care provider's instructions about healthy diet, exercising, and getting tested or screened for diseases. Follow your health care provider's instructions on monitoring your cholesterol and blood pressure. This information is not intended to replace advice given to you by your health care provider. Make sure you discuss any questions you have with your health care provider. Document Revised: 03/31/2021 Document Reviewed: 03/31/2021 Elsevier Patient Education  2024 [employer].

## 2023-07-20 NOTE — Assessment & Plan Note (Signed)
Last hemoglobin A1c 6.1 in 06/2022. Encouraged a healthy lifestyle for diabetes prevention. Further recommendation will be given according to hemoglobin A1c result.

## 2023-07-20 NOTE — Assessment & Plan Note (Addendum)
Problem has been recurrent for years and now adequately controlled and stable on fluoxetine.  Continue Fluoxetine 20 mg daily. Follow-up in a year, before if needed.

## 2023-07-20 NOTE — Assessment & Plan Note (Signed)
Reports problem as well-controlled. Continue fluoxetine 20 mg daily. As far as problem is stable, annual follow-up can be continued.

## 2023-07-21 ENCOUNTER — Other Ambulatory Visit (INDEPENDENT_AMBULATORY_CARE_PROVIDER_SITE_OTHER): Payer: No Typology Code available for payment source

## 2023-07-21 DIAGNOSIS — E782 Mixed hyperlipidemia: Secondary | ICD-10-CM | POA: Diagnosis not present

## 2023-07-21 DIAGNOSIS — R7303 Prediabetes: Secondary | ICD-10-CM

## 2023-07-21 LAB — LIPID PANEL
Cholesterol: 175 mg/dL (ref 0–200)
HDL: 45.1 mg/dL (ref >39.00)
LDL Cholesterol: 103 mg/dL — ABNORMAL HIGH (ref 0–99)
NonHDL: 130.23
Total CHOL/HDL Ratio: 4
Triglycerides: 138 mg/dL (ref 0.0–149.0)
VLDL: 27.6 mg/dL (ref 0.0–40.0)

## 2023-07-21 LAB — BASIC METABOLIC PANEL
BUN: 16 mg/dL (ref 6–23)
CO2: 28 mEq/L (ref 19–32)
Calcium: 9.5 mg/dL (ref 8.4–10.5)
Chloride: 102 mEq/L (ref 96–112)
Creatinine, Ser: 0.86 mg/dL (ref 0.40–1.20)
GFR: 78.13 mL/min (ref >60.00)
Glucose, Bld: 108 mg/dL — ABNORMAL HIGH (ref 70–99)
Potassium: 4.8 mEq/L (ref 3.5–5.1)
Sodium: 138 mEq/L (ref 135–145)

## 2023-07-21 LAB — HEMOGLOBIN A1C: Hgb A1c MFr Bld: 5.7 % (ref 4.6–6.5)

## 2023-07-21 NOTE — Procedures (Signed)
[facility]  HOME SLEEP TEST (Watch PAT) REPORT  STUDY DATE: 07/20/2023  DOB: [birth year]  MRN: [id number]  ORDERING CLINICIAN: Huston Foley, MD, PhD   REFERRING CLINICIAN: [place], Betty G, MD   CLINICAL INFORMATION/HISTORY: 52 year old female with an underlying medical history of allergies, asthma, reflux disease, anemia, anxiety, depression, palpitations, migraine headaches, carpal tunnel syndrome, arthritis with status post left partial knee replacement in June 2024, and obesity, who reports snoring and excessive daytime somnolence as well as witnessed apneas.    Epworth sleepiness score: 15/24.  BMI: 33.2 kg/m  FINDINGS:   Sleep Summary:   Total Recording Time (hours, min): 5 hours, 58 min  Total Sleep Time (hours, min):  5 hours, 30 min  Percent REM (%):    41.4%   Respiratory Indices:   Calculated pAHI (per hour):  10.8/hour         REM pAHI:    24.7/hour       NREM pAHI: 9.3/hour  Central pAHI: 1.2/hour  Oxygen Saturation Statistics:    Oxygen Saturation (%) Mean: 92%   Minimum oxygen saturation (%):                 88%   O2 Saturation Range (%): 88 - 100%    O2 Saturation (minutes) <=88%: 0.1 min  Pulse Rate Statistics:   Pulse Mean (bpm):    73/min    Pulse Range (62 - 101/min)   IMPRESSION: OSA (obstructive sleep apnea), mild  RECOMMENDATION:  This home sleep test demonstrates overall mild obstructive sleep apnea with a total AHI of 10.8/hour and O2 nadir of 88%. Snoring was detected, and was intermittent, mostly in the mild range. Given the patient's medical history and sleep related complaints, therapy with a  positive airway pressure device is recommended. Treatment can be achieved in the form of autoPAP trial/titration at home for now. A full night, in-lab PAP titration study may aid in improving proper treatment settings and with mask fit, if needed, down the road. Alternative treatments may include weight loss (where  appropriate) along with avoidance of the supine sleep position (if possible), or an oral appliance in appropriate candidates.   Please note that untreated obstructive sleep apnea may carry additional perioperative morbidity. Patients with significant obstructive sleep apnea should receive perioperative PAP therapy and the surgeons and particularly the anesthesiologist should be informed of the diagnosis and the severity of the sleep disordered breathing. The patient should be cautioned not to drive, work at heights, or operate dangerous or heavy equipment when tired or sleepy. Review and reiteration of good sleep hygiene measures should be pursued with any patient. Other causes of the patient's symptoms, including circadian rhythm disturbances, an underlying mood disorder, medication effect and/or an underlying medical problem cannot be ruled out based on this test. Clinical correlation is recommended.  The patient and her referring provider will be notified of the test results. The patient will be seen in follow up in sleep clinic at [facility], as necessary.  I certify that I have reviewed the raw data recording prior to the issuance of this report in accordance with the standards of the American Academy of Sleep Medicine (AASM).    INTERPRETING PHYSICIAN:   Huston Foley, MD, PhD Medical Director, [facility]) Diplomat, ABPN (Neurology and Sleep)   [facility] [address] [place], [place] [address] [phone]

## 2023-07-21 NOTE — Progress Notes (Signed)
See procedure note.

## 2023-07-21 NOTE — Addendum Note (Signed)
Addended by: Huston Foley on: 07/21/2023 05:16 PM   Modules accepted: Orders

## 2023-07-22 ENCOUNTER — Telehealth: Payer: Self-pay

## 2023-07-22 NOTE — Telephone Encounter (Addendum)
[facility] confirmed receipt of order.  

## 2023-07-22 NOTE — Assessment & Plan Note (Signed)
No rash appreciated. We discussed possible etiologies, problem started before knee surgery and no new meds. LFT's 03/2023 normal and no associated jaundice. ? Dry skin, neurodermatitis. She can try Triamcinolone 0.1%:Eucerine 1:1 daily on affected skin. If persistent we can consider appt with dermatologist.

## 2023-07-22 NOTE — Telephone Encounter (Signed)
Order sent to [facility].  

## 2023-07-22 NOTE — Telephone Encounter (Signed)
-----   Message from Nurse Baird Lyons B sent at 07/22/2023  9:24 AM EDT -----  ----- Message ----- From: Huston Foley, MD Sent: 07/21/2023   5:16 PM EDT To: Gna-Pod 4 Results  Patient referred by PCP, seen by me on 05/31/2023, patient had HST on 07/20/2023.    Please call and notify the patient that the recent home sleep test showed obstructive sleep apnea. OSA is overall mild, but worth treating to see if she feels better after treatment. To that end I recommend treatment for this in the form of autoPAP, which means, that we don't have to bring her in for a sleep study with CPAP, but will let her try an autoPAP machine at home, through a DME company (of her choice, or as per insurance requirement). The DME representative will educate her on how to use the machine, how to put the mask on, etc. I have placed an order in the chart. Please send referral, talk to patient, send report to referring MD. We will need a FU in sleep clinic in about 2.-3 months post-PAP set up (which is usually an insurance-mandated appointment to monitor compliance), please arrange that with me or one of our NPs. Please also go over the need for compliance with treatment (including the insurance-imposed minimum compliance percentage). Thanks,   Huston Foley, MD, PhD [facility])

## 2023-07-22 NOTE — Telephone Encounter (Signed)
Spoke with patient about the results of her recent home sleep study. Pt is agreeable to starting autoCPAP. Pt had no questions or concerns at this time. Pt is aware to be on the lookout for a follow up appt that needs to be scheduled once she is setup on her therapy. Pt aware to call us back with any questions or concerns.

## 2023-07-22 NOTE — Telephone Encounter (Signed)
Report sent to referring provider.

## 2023-07-27 ENCOUNTER — Encounter: Payer: No Typology Code available for payment source | Admitting: Family Medicine

## 2023-08-26 ENCOUNTER — Telehealth: Payer: Self-pay | Admitting: Neurology

## 2023-08-26 NOTE — Telephone Encounter (Signed)
LVM  for pt to call to setup Initial CPAP.

## 2023-08-30 NOTE — Telephone Encounter (Signed)
Called pt and scheduled her Initial Cpap appt.

## 2023-09-15 ENCOUNTER — Ambulatory Visit: Payer: No Typology Code available for payment source | Admitting: Family Medicine

## 2023-09-15 ENCOUNTER — Encounter: Payer: Self-pay | Admitting: Family Medicine

## 2023-09-15 VITALS — BP 120/80 | HR 76 | Temp 98.4°F | Resp 12 | Ht 67.0 in | Wt 201.5 lb

## 2023-09-15 DIAGNOSIS — J069 Acute upper respiratory infection, unspecified: Secondary | ICD-10-CM | POA: Diagnosis not present

## 2023-09-15 DIAGNOSIS — J4520 Mild intermittent asthma, uncomplicated: Secondary | ICD-10-CM | POA: Diagnosis not present

## 2023-09-15 NOTE — Progress Notes (Signed)
ACUTE VISIT Chief Complaint  Patient presents with   Sore Throat    Started feeling sick last Thursday, had the classic signs of Strep on Saturday.    HPI: [name] is a 52 y.o. female with a PMHx significant for depression, insomnia, GERD, asthma, anxiety, chronic fatigue, and seasonal allergies, who is here today concerned about possible strep throat.   Sore Throat  This is a new problem. The current episode started 1 to 4 weeks ago. The problem has been resolved. There has been no fever. Associated symptoms include congestion and coughing. Pertinent negatives include no abdominal pain, diarrhea, drooling, ear discharge, hoarse voice, neck pain, shortness of breath, stridor, swollen glands, trouble swallowing or vomiting. She has had no exposure to strep or mono. She has tried acetaminophen for the symptoms.   She complains of sore throat starting on 10/15. She endorses associated chills, feeling febrile ( max temp 98.0 F), fatigue, cough, headache, chest congestion, wheezing, muscle aches, and left earache.  She states that she had "white spots" on her tonsils that she drained.  She has history of tonsil stones but she does thinks she was having any during acute illness. She did not perform a COVID  test.  She took tylenol for her symptoms.  Most symptoms have resolved, occasional non productive cough but no longer having wheezing and negative for dyspnea. She is concerned about being contagious.  She says that she feels well today, but is concerned about her history of asthma and potential contagiousness.   Asthma: She says she is not using her albuterol inhaler very frequently.   Review of Systems  HENT:  Positive for congestion. Negative for drooling, ear discharge, hoarse voice and trouble swallowing.   Respiratory:  Positive for cough. Negative for shortness of breath and stridor.   Gastrointestinal:  Negative for abdominal pain, diarrhea and vomiting.   Genitourinary:  Negative for decreased urine volume and hematuria.  Musculoskeletal:  Negative for neck pain.  Allergic/Immunologic: Positive for environmental allergies.  Neurological:  Negative for syncope and facial asymmetry.  Hematological:  Negative for adenopathy. Does not bruise/bleed easily.  See other pertinent positives and negatives in HPI.  Current Outpatient Medications on File Prior to Visit  Medication Sig Dispense Refill   cetirizine (ZYRTEC) 10 MG tablet Take 10 mg by mouth in the morning.     FLUoxetine (PROZAC) 20 MG capsule TAKE 1 CAPSULE BY MOUTH EVERY DAY 90 capsule 1   meloxicam (MOBIC) 15 MG tablet Take 1 tablet (15 mg total) by mouth daily. 30 tablet 2   NON FORMULARY Take 1 capsule by mouth See admin instructions. CBD gelcap- Take 1 gelcap by mouth prior to bed every night     omeprazole (PRILOSEC OTC) 20 MG tablet Take 20 mg by mouth daily before breakfast.     PROAIR HFA 108 (90 Base) MCG/ACT inhaler Inhale 1-2 puffs into the lungs every 6 (six) hours as needed for wheezing or shortness of breath.     No current facility-administered medications on file prior to visit.   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 08/2021   Carpal tunnel syndrome    Right   Depression    Dysrhythmia    recent palpitations   GERD (gastroesophageal reflux disease)    Intermittent palpitations    Loose  body of right knee    Migraines    Osteochondral defect    right knee   No Known Allergies  Social History   Socioeconomic History   Marital status: Married    Spouse name: Not on file   Number of children: Not on file   Years of education: Not on file   Highest education level: Not on file  Occupational History   Not on file  Tobacco Use   Smoking status: Former    Current packs/day: 0.00    Types: Cigarettes    Start date:  06/09/1989    Quit date: 06/10/1995    Years since quitting: 28.2   Smokeless tobacco: Never  Vaping Use   Vaping status: Never Used  Substance and Sexual Activity   Alcohol use: Yes    Alcohol/week: 0.0 standard drinks of alcohol    Comment: occasional   Drug use: No   Sexual activity: Yes    Partners: Male    Birth control/protection: Surgical  Other Topics Concern   Not on file  Social History Narrative   Not on file   Social Determinants of Health   Financial Resource Strain: Patient Declined (03/09/2023)   Overall Financial Resource Strain (CARDIA)    Difficulty of Paying Living Expenses: Patient declined  Food Insecurity: Patient Declined (03/09/2023)   Hunger Vital Sign    Worried About Running Out of Food in the Last Year: Patient declined    Ran Out of Food in the Last Year: Patient declined  Transportation Needs: Patient Declined (03/09/2023)   PRAPARE - Administrator, Civil Service (Medical): Patient declined    Lack of Transportation (Non-Medical): Patient declined  Physical Activity: Insufficiently Active (03/09/2023)   Exercise Vital Sign    Days of Exercise per Week: 2 days    Minutes of Exercise per Session: 40 min  Stress: Stress Concern Present (03/09/2023)   [employer] of Occupational Health - Occupational Stress Questionnaire    Feeling of Stress : Very much  Social Connections: Unknown (03/09/2023)   Social Connection and Isolation Panel [NHANES]    Frequency of Communication with Friends and Family: Patient declined    Frequency of Social Gatherings with Friends and Family: Patient declined    Attends Religious Services: Patient declined    Database administrator or Organizations: Patient declined    Attends Banker Meetings: Not on file    Marital Status: Patient declined   Vitals:   09/15/23 1426  BP: 120/80  Pulse: 76  Resp: 12  Temp: 98.4 F (36.9 C)  SpO2: 95%   Body mass index is 31.56 kg/m.  Physical  Exam Vitals and nursing note reviewed.  Constitutional:      General: She is not in acute distress.    Appearance: She is well-developed. She is not ill-appearing.  HENT:     Head: Normocephalic and atraumatic.     Right Ear: Tympanic membrane, ear canal and external ear normal.     Left Ear: Tympanic membrane, ear canal and external ear normal.     Nose: No rhinorrhea.     Mouth/Throat:     Mouth: Mucous membranes are moist. No oral lesions.     Pharynx: Oropharynx is clear. Uvula midline. No pharyngeal swelling, oropharyngeal exudate or posterior oropharyngeal erythema.  Eyes:     Conjunctiva/sclera: Conjunctivae normal.  Cardiovascular:     Rate and Rhythm: Normal rate and regular rhythm.     Heart sounds: No  murmur heard. Pulmonary:     Effort: Pulmonary effort is normal. No respiratory distress.     Breath sounds: Normal breath sounds. No stridor.  Lymphadenopathy:     Head:     Right side of head: No submandibular adenopathy.     Left side of head: No submandibular adenopathy.     Cervical: No cervical adenopathy.  Skin:    General: Skin is warm.     Findings: No erythema or rash.  Neurological:     General: No focal deficit present.     Mental Status: She is alert and oriented to person, place, and time.  Psychiatric:        Mood and Affect: Affect normal. Mood is anxious.   ASSESSMENT AND PLAN:  Ms. Slupski was seen today for sore throat.   Mild intermittent asthma without complication Lung auscultation today negative for wheezing. Recommend Albuterol inh 2 puff every 6 hours for a week then as needed for wheezing or shortness of breath.   URI, acute History suggests a viral infection, most symptoms have resolved, she still has some residual nonproductive cough. Reassured, I do not think she is longer contagious. Pharynx and tonsils are not erythematous and no cervical lymphadenopathy, so I do not think further testing is needed at this time.  Return if  symptoms worsen or fail to improve, for keep next appointment.  I, Rolla Etienne Wierda, acting as a scribe for Gurvir Schrom [place], MD., have documented all relevant documentation on the behalf of Gage Treiber [place], MD, as directed by  Baker Kogler [place], MD while in the presence of Webb Weed [place], MD.   I, Claryce Friel [place], MD, have reviewed all documentation for this visit. The documentation on 09/15/23 for the exam, diagnosis, procedures, and orders are all accurate and complete.  Daimon Kean G. [place], MD  [facility]. [place] office.

## 2023-09-15 NOTE — Patient Instructions (Addendum)
A few things to remember from today's visit:  Mild intermittent asthma without complication  URI, acute I do not think you are longer contagious. Albuterol inh 2 puff every 6 hours for a week then as needed for wheezing or shortness of breath.   If you need refills for medications you take chronically, please call your pharmacy. Do not use My Chart to request refills or for acute issues that need immediate attention. If you send a my chart message, it may take a few days to be addressed, specially if I am not in the office.  Please be sure medication list is accurate. If a new problem present, please set up appointment sooner than planned today.

## 2023-10-12 NOTE — Progress Notes (Unsigned)
[facility] [address] [place]. [place] [address] [phone]       OFFICE FOLLOW UP NOTE  [name] Date of Birth:  [birth year] Medical Record Number:  [id number]   Primary neurologist: Dr. Frances Furbish Reason for visit: Initial CPAP follow-up    SUBJECTIVE:   CHIEF COMPLAINT:  No chief complaint on file.   Follow-up visit:  Prior visit:  Brief HPI:   Shelly [name] is a 52 y.o. female who was evaluated by Dr. Frances Furbish on 05/31/2019 for for concern of underlying sleep apnea with complaints of snoring, excessive daytime somnolence and waking up with a sense of gasping for air.  ESS 15/24. FSS 35/63.  HST showed overall mild sleep apnea with total AHI of 10.8/h and O2 nadir of 88%.  AutoPap initiated 08/16/2023.       Interval history:        ROS:   14 system review of systems performed and negative with exception of those listed in HPI  PMH:  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 08/2021   Carpal tunnel syndrome    Right   Depression    Dysrhythmia    recent palpitations   GERD (gastroesophageal reflux disease)    Intermittent palpitations    Loose body of right knee    Migraines    Osteochondral defect    right knee    PSH:  Past Surgical History:  Procedure Laterality Date   CESAREAN SECTION  06-03-2011//  09-27-2003//  2000   Bilateral Tubal Ligation w/ last c/s   CHONDROPLASTY Right 06/11/2015   Procedure: CHONDROPLASTY;  Surgeon: Eugenia Mcalpine, MD;  Location: [facility];  Service: Orthopedics;  Laterality: Right;   DILATION AND CURETTAGE OF UTERUS     FOREIGN BODY REMOVAL Right 06/11/2015   Procedure: RIGHT REMOVAL LOOSE BODY;  Surgeon: Eugenia Mcalpine, MD;  Location: [facility];  Service: Orthopedics;  Laterality: Right;   KNEE ARTHROSCOPY Right  11/23/1988   KNEE ARTHROSCOPY Right 06/11/2015   Procedure: RIGHT ARTHROSCOPY KNEE;  Surgeon: Eugenia Mcalpine, MD;  Location: [facility];  Service: Orthopedics;  Laterality: Right;   LEFT WRIST REDUCTION AND FUSION   07/19/2008   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   PARTIAL KNEE ARTHROPLASTY Right 09/02/2015   Procedure: RIGHT UNI KNEE ARTHROPLASTY MEDIALLY;  Surgeon: Durene Romans, MD;  Location: [facility];  Service: Orthopedics;  Laterality: Right;   PARTIAL KNEE ARTHROPLASTY Left 05/05/2023   Procedure: UNICOMPARTMENTAL KNEE;  Surgeon: Joen Laura, MD;  Location: [facility];  Service: Orthopedics;  Laterality: Left;   TOTAL KNEE REVISION Right 03/20/2016   Procedure: RIGHT UNI KNEE POLY EXCHANGE;  Surgeon: Durene Romans, MD;  Location: [facility];  Service: Orthopedics;  Laterality: Right;   TOTAL KNEE REVISION WITH SCAR DEBRIDEMENT/PATELLA REVISION WITH POLY EXCHANGE Right 11/11/2021   Procedure: Right unicompartmental knee arthroplasty poly revision;  Surgeon: Durene Romans, MD;  Location: [facility];  Service: Orthopedics;  Laterality: Right;   TUBAL LIGATION     WISDOM TOOTH EXTRACTION Bilateral     Social History:  Social History   Socioeconomic History   Marital status: Married    Spouse name: Not on file   Number of children: Not on file   Years of education: Not on file   Highest education  level: Not on file  Occupational History   Not on file  Tobacco Use   Smoking status: Former    Current packs/day: 0.00    Types: Cigarettes    Start date: 06/09/1989    Quit date: 06/10/1995    Years since quitting: 28.3   Smokeless tobacco: Never  Vaping Use   Vaping status: Never Used  Substance and Sexual Activity   Alcohol use: Yes    Alcohol/week: 0.0 standard drinks of alcohol    Comment: occasional   Drug use: No   Sexual activity: Yes    Partners: Male    Birth control/protection: Surgical  Other Topics Concern   Not on file  Social History Narrative   Not on file    Social Determinants of Health   Financial Resource Strain: Patient Declined (03/09/2023)   Overall Financial Resource Strain (CARDIA)    Difficulty of Paying Living Expenses: Patient declined  Food Insecurity: Patient Declined (03/09/2023)   Hunger Vital Sign    Worried About Running Out of Food in the Last Year: Patient declined    Ran Out of Food in the Last Year: Patient declined  Transportation Needs: Patient Declined (03/09/2023)   PRAPARE - Administrator, Civil Service (Medical): Patient declined    Lack of Transportation (Non-Medical): Patient declined  Physical Activity: Insufficiently Active (03/09/2023)   Exercise Vital Sign    Days of Exercise per Week: 2 days    Minutes of Exercise per Session: 40 min  Stress: Stress Concern Present (03/09/2023)   [employer] of Occupational Health - Occupational Stress Questionnaire    Feeling of Stress : Very much  Social Connections: Unknown (03/09/2023)   Social Connection and Isolation Panel [NHANES]    Frequency of Communication with Friends and Family: Patient declined    Frequency of Social Gatherings with Friends and Family: Patient declined    Attends Religious Services: Patient declined    Database administrator or Organizations: Patient declined    Attends Engineer, structural: Not on file    Marital Status: Patient declined  Catering manager Violence: Not on file    Family History:  Family History  Problem Relation Age of Onset   Arthritis Mother    COPD Father    Emphysema Father    Sleep apnea Father    Alcohol abuse Paternal Grandmother    Cancer Paternal Grandmother     Medications:   Current Outpatient Medications on File Prior to Visit  Medication Sig Dispense Refill   cetirizine (ZYRTEC) 10 MG tablet Take 10 mg by mouth in the morning.     FLUoxetine (PROZAC) 20 MG capsule TAKE 1 CAPSULE BY MOUTH EVERY DAY 90 capsule 1   meloxicam (MOBIC) 15 MG tablet Take 1 tablet (15 mg total)  by mouth daily. 30 tablet 2   NON FORMULARY Take 1 capsule by mouth See admin instructions. CBD gelcap- Take 1 gelcap by mouth prior to bed every night     omeprazole (PRILOSEC OTC) 20 MG tablet Take 20 mg by mouth daily before breakfast.     PROAIR HFA 108 (90 Base) MCG/ACT inhaler Inhale 1-2 puffs into the lungs every 6 (six) hours as needed for wheezing or shortness of breath.     No current facility-administered medications on file prior to visit.    Allergies:  No Known Allergies    OBJECTIVE:  Physical Exam  There were no vitals filed for this visit. There is no height or  weight on file to calculate BMI. No results found.   General: well developed, well nourished, seated, in no evident distress Head: head normocephalic and atraumatic.   Neck: supple with no carotid or supraclavicular bruits Cardiovascular: regular rate and rhythm, no murmurs Musculoskeletal: no deformity Skin:  no rash/petichiae Vascular:  Normal pulses all extremities   Neurologic Exam Mental Status: Awake and fully alert. Oriented to place and time. Recent and remote memory intact. Attention span, concentration and fund of knowledge appropriate. Mood and affect appropriate.  Cranial Nerves: Pupils equal, briskly reactive to light. Extraocular movements full without nystagmus. Visual fields full to confrontation. Hearing intact. Facial sensation intact. Face, tongue, palate moves normally and symmetrically.  Motor: Normal bulk and tone. Normal strength in all tested extremity muscles Sensory.: intact to touch , pinprick , position and vibratory sensation.  Coordination: Rapid alternating movements normal in all extremities. Finger-to-nose and heel-to-shin performed accurately bilaterally. Gait and Station: Arises from chair without difficulty. Stance is normal. Gait demonstrates normal stride length and balance without use of AD. Tandem walk and heel toe without difficulty.  Reflexes: 1+ and symmetric. Toes  downgoing.         ASSESSMENT/PLAN: Shelly [name] is a 52 y.o. year old female    OSA on CPAP : Compliance report shows satisfactory usage with optimal residual AHI.  Discussed continued nightly usage with ensuring greater than 4 hours nightly for optimal benefit and per insurance purposes.  Continue to follow with DME company for any needed supplies or CPAP related concerns     Follow up in *** or call earlier if needed   CC:  PCP: [place], Betty G, MD    I spent *** minutes of face-to-face and non-face-to-face time with patient.  This included previsit chart review, lab review, study review, order entry, electronic health record documentation, patient education and discussion regarding above diagnoses and treatment plan and answered all other questions to patient's satisfaction    Ihor Austin, [facility]  [facility] [address] [place], [place] [address]  Phone [phone] Fax [phone] Note: This document was prepared with digital dictation and possible smart phrase technology. Any transcriptional errors that result from this process are unintentional.

## 2023-10-13 ENCOUNTER — Encounter: Payer: Self-pay | Admitting: Adult Health

## 2023-10-13 ENCOUNTER — Ambulatory Visit (INDEPENDENT_AMBULATORY_CARE_PROVIDER_SITE_OTHER): Payer: No Typology Code available for payment source | Admitting: Adult Health

## 2023-10-13 VITALS — BP 125/83 | HR 69 | Ht 65.0 in | Wt 207.6 lb

## 2023-10-13 DIAGNOSIS — G4733 Obstructive sleep apnea (adult) (pediatric): Secondary | ICD-10-CM | POA: Diagnosis not present

## 2023-10-13 DIAGNOSIS — G4719 Other hypersomnia: Secondary | ICD-10-CM

## 2023-10-13 NOTE — Patient Instructions (Addendum)
Your Plan:  Continue nightly use of CPAP for adequate sleep apnea management   If you continue to experience daytime fatigue, please let me know and we can consider do MLST testing to rule out narcolepsy   Continue to follow with DME [facility] for any needed supplies or CPAP related concerns      Follow up in 6 months or call earlier if needed      Thank you for coming to see [place] at [facility]. I hope we have been able to provide you high quality care today.  You may receive a patient satisfaction survey over the next few weeks. We would appreciate your feedback and comments so that we may continue to improve ourselves and the health of our patients.

## 2023-10-14 ENCOUNTER — Telehealth: Payer: Self-pay

## 2023-10-25 ENCOUNTER — Other Ambulatory Visit: Payer: Self-pay | Admitting: Family Medicine

## 2023-10-25 DIAGNOSIS — F419 Anxiety disorder, unspecified: Secondary | ICD-10-CM

## 2023-10-25 DIAGNOSIS — F3341 Major depressive disorder, recurrent, in partial remission: Secondary | ICD-10-CM

## 2024-04-26 NOTE — Progress Notes (Unsigned)
 [facility] [address] [place].  [address] [phone]       OFFICE FOLLOW UP NOTE  [name] Date of Birth:  [birth year] Medical Record Number:  [id number]   Primary neurologist: Dr. Omar Bibber Reason for visit: CPAP follow-up    SUBJECTIVE:   CHIEF COMPLAINT:  No chief complaint on file.   Follow-up visit:  Prior visit: 10/13/2023  Brief HPI:   Shelly [name] is a 53 y.o. female who was evaluated by Dr. Omar Bibber on 05/31/2023 for concern of underlying sleep apnea with complaints of snoring, excessive daytime somnolence and waking up with a sense of gasping for air.  ESS 15/24. FSS 35/63.  HST showed overall mild sleep apnea with total AHI of 10.8/h and REM AHI 24.7/hr and O2 nadir of 88%.  AutoPap initiated 08/16/2023.  At prior visit, noted excellent compliance and optimal residual AHI.  Continue to struggle with insomnia, vivid dreams and sleep paralysis as well as excessive daytime sleepiness.   Interval history:    Reports gradual improvement of CPAP therapy.  Currently using nasal mask, tolerating well.  She does continue to experience excessive daytime sleepiness, she still struggles with insomnia, will awaken previously around 2am with difficulty falling back to sleep.  She does admit to vivid dreams and has previously experienced sleep paralysis.  ESS 11/24. FSS 31/63.         ROS:   14 system review of systems performed and negative with exception of those listed in HPI  PMH:  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 08/2021   Carpal tunnel syndrome    Right   Depression    Dysrhythmia    recent palpitations   GERD (gastroesophageal reflux disease)    Intermittent palpitations    Loose body of right knee    Migraines    Osteochondral defect    right knee    PSH:  Past  Surgical History:  Procedure Laterality Date   CESAREAN SECTION  06-03-2011//  09-27-2003//  2000   Bilateral Tubal Ligation w/ last c/s   CHONDROPLASTY Right 06/11/2015   Procedure: CHONDROPLASTY;  Surgeon: Genevie Kerns, MD;  Location: [facility];  Service: Orthopedics;  Laterality: Right;   DILATION AND CURETTAGE OF UTERUS     FOREIGN BODY REMOVAL Right 06/11/2015   Procedure: RIGHT REMOVAL LOOSE BODY;  Surgeon: Genevie Kerns, MD;  Location: [facility];  Service: Orthopedics;  Laterality: Right;   KNEE ARTHROSCOPY Right 11/23/1988   KNEE ARTHROSCOPY Right 06/11/2015   Procedure: RIGHT ARTHROSCOPY KNEE;  Surgeon: Genevie Kerns, MD;  Location: [facility];  Service: Orthopedics;  Laterality: Right;   LEFT WRIST REDUCTION AND FUSION   07/19/2008   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   PARTIAL KNEE ARTHROPLASTY Right 09/02/2015   Procedure: RIGHT UNI KNEE ARTHROPLASTY MEDIALLY;  Surgeon: Claiborne Crew, MD;  Location: [facility];  Service: Orthopedics;  Laterality: Right;   PARTIAL KNEE ARTHROPLASTY Left 05/05/2023   Procedure: UNICOMPARTMENTAL KNEE;  Surgeon: Murleen Arms, MD;  Location: [facility];  Service: Orthopedics;  Laterality: Left;   TOTAL KNEE REVISION Right 03/20/2016   Procedure: RIGHT UNI KNEE POLY EXCHANGE;  Surgeon: Claiborne Crew, MD;  Location: [facility];  Service: Orthopedics;  Laterality: Right;   TOTAL KNEE REVISION WITH SCAR DEBRIDEMENT/PATELLA  REVISION WITH POLY EXCHANGE Right 11/11/2021   Procedure: Right unicompartmental knee arthroplasty poly revision;  Surgeon: Claiborne Crew, MD;  Location: [facility];  Service: Orthopedics;  Laterality: Right;   TUBAL LIGATION     WISDOM TOOTH EXTRACTION Bilateral     Social History:  Social History   Socioeconomic History   Marital status: Married    Spouse name: Not on file   Number of children: Not on file   Years of education: Not on file   Highest education level: Not on file   Occupational History   Not on file  Tobacco Use   Smoking status: Former    Current packs/day: 0.00    Types: Cigarettes    Start date: 06/09/1989    Quit date: 06/10/1995    Years since quitting: 28.8   Smokeless tobacco: Never  Vaping Use   Vaping status: Never Used  Substance and Sexual Activity   Alcohol  use: Yes    Alcohol /week: 0.0 standard drinks of alcohol     Comment: occasional   Drug use: No   Sexual activity: Yes    Partners: Male    Birth control/protection: Surgical  Other Topics Concern   Not on file  Social History Narrative   Not on file   Social Drivers of Health   Financial Resource Strain: Patient Declined (03/09/2023)   Overall Financial Resource Strain (CARDIA)    Difficulty of Paying Living Expenses: Patient declined  Food Insecurity: Patient Declined (03/09/2023)   Hunger Vital Sign    Worried About Running Out of Food in the Last Year: Patient declined    Ran Out of Food in the Last Year: Patient declined  Transportation Needs: Patient Declined (03/09/2023)   PRAPARE - Administrator, Civil Service (Medical): Patient declined    Lack of Transportation (Non-Medical): Patient declined  Physical Activity: Insufficiently Active (03/09/2023)   Exercise Vital Sign    Days of Exercise per Week: 2 days    Minutes of Exercise per Session: 40 min  Stress: Stress Concern Present (03/09/2023)   [employer] of Occupational Health - Occupational Stress Questionnaire    Feeling of Stress : Very much  Social Connections: Unknown (03/09/2023)   Social Connection and Isolation Panel [NHANES]    Frequency of Communication with Friends and Family: Patient declined    Frequency of Social Gatherings with Friends and Family: Patient declined    Attends Religious Services: Patient declined    Database administrator or Organizations: Patient declined    Attends Engineer, structural: Not on file    Marital Status: Patient declined  Careers information officer Violence: Not on file    Family History:  Family History  Problem Relation Age of Onset   Arthritis Mother    COPD Father    Emphysema Father    Sleep apnea Father    Alcohol  abuse Paternal Grandmother    Cancer Paternal Grandmother     Medications:   Current Outpatient Medications on File Prior to Visit  Medication Sig Dispense Refill   cetirizine (ZYRTEC) 10 MG tablet Take 10 mg by mouth in the morning.     FLUoxetine  (PROZAC ) 20 MG capsule TAKE 1 CAPSULE BY MOUTH EVERY DAY 90 capsule 1   meloxicam  (MOBIC ) 15 MG tablet Take 1 tablet (15 mg total) by mouth daily. 30 tablet 2   NON FORMULARY Take 1 capsule by mouth See admin instructions. CBD gelcap- Take 1 gelcap by mouth prior to bed every night  omeprazole (PRILOSEC OTC) 20 MG tablet Take 20 mg by mouth daily before breakfast.     PROAIR  HFA 108 (90 Base) MCG/ACT inhaler Inhale 1-2 puffs into the lungs every 6 (six) hours as needed for wheezing or shortness of breath.     No current facility-administered medications on file prior to visit.    Allergies:  No Known Allergies    OBJECTIVE:  Physical Exam  There were no vitals filed for this visit.  There is no height or weight on file to calculate BMI. No results found.  General: well developed, well nourished, pleasant middle-age Caucasian female, seated, in no evident distress  Neurologic Exam Mental Status: Awake and fully alert. Oriented to place and time. Recent and remote memory intact. Attention span, concentration and fund of knowledge appropriate. Mood and affect appropriate.  Cranial Nerves: Pupils equal, briskly reactive to light. Extraocular movements full without nystagmus. Visual fields full to confrontation. Hearing intact. Facial sensation intact. Face, tongue, palate moves normally and symmetrically.  Motor: Normal bulk and tone. Normal strength in all tested extremity muscles Gait and Station: Arises from chair without difficulty. Stance is  normal. Gait demonstrates normal stride length and balance without use of AD. Tandem walk and heel toe without difficulty.  Reflexes: 1+ and symmetric. Toes downgoing.         ASSESSMENT/PLAN: Shelly [name] is a 53 y.o. year old female    OSA on CPAP : Compliance report shows satisfactory usage with optimal residual AHI.  Continue current pressure settings.  Discussed continued nightly usage with ensuring greater than 4 hours nightly for optimal benefit and per insurance purposes.  Continue to follow with DME company for any needed supplies or CPAP related concerns Excessive daytime sleepiness: hopefully will continue to improve over the next couple of months with CPAP use. She also has insomnia which is likely contributing, encouraged her to further discuss with PCP, currently using melatonin. If symptoms persist, could consider MLST to rule out narcolepsy as she does report vivid dreams and has experienced sleep paralysis but advised she would need to stop fluoxetine  prior to testing which she is hesitant about.      Follow up in 6 months or call earlier if needed   CC:  PCP: [place], Betty G, MD    I spent 30 minutes of face-to-face and non-face-to-face time with patient.  This included previsit chart review, lab review, study review, order entry, electronic health record documentation, patient education and discussion regarding above diagnoses and treatment plan and answered all other questions to patient's satisfaction  Johny Nap, [facility]  [facility] [address] [place], [place] [address]  Phone [phone] Fax [phone] Note: This document was prepared with digital dictation and possible smart phrase technology. Any transcriptional errors that result from this process are unintentional.

## 2024-04-27 ENCOUNTER — Ambulatory Visit: Payer: No Typology Code available for payment source | Admitting: Adult Health

## 2024-04-27 ENCOUNTER — Encounter: Payer: Self-pay | Admitting: Adult Health

## 2024-04-27 VITALS — BP 119/89 | HR 79 | Ht 67.0 in | Wt 203.0 lb

## 2024-04-27 DIAGNOSIS — G4733 Obstructive sleep apnea (adult) (pediatric): Secondary | ICD-10-CM | POA: Diagnosis not present

## 2024-04-27 DIAGNOSIS — G4719 Other hypersomnia: Secondary | ICD-10-CM

## 2024-04-27 DIAGNOSIS — G4700 Insomnia, unspecified: Secondary | ICD-10-CM

## 2024-04-27 NOTE — Patient Instructions (Signed)
 Your Plan:  Continue nightly use of CPAP and ensure you are using greater than 4 hours per night  Please follow up with your PCP regarding persistent insomnia concerns  Will follow up with Dr. Omar Bibber regarding other medications to help with wakefulness   Please let me know if you would like to proceed to MLST to look for underlying hypersomnia conditions     Follow up in 4 months with Dr. Omar Bibber or call earlier if needed     Thank you for coming to see us  at [facility]. I hope we have been able to provide you high quality care today.  You may receive a patient satisfaction survey over the next few weeks. We would appreciate your feedback and comments so that we may continue to improve ourselves and the health of our patients.

## 2024-04-28 ENCOUNTER — Other Ambulatory Visit: Payer: Self-pay | Admitting: Family Medicine

## 2024-04-28 DIAGNOSIS — F419 Anxiety disorder, unspecified: Secondary | ICD-10-CM

## 2024-04-28 DIAGNOSIS — F3341 Major depressive disorder, recurrent, in partial remission: Secondary | ICD-10-CM

## 2024-05-30 ENCOUNTER — Ambulatory Visit (INDEPENDENT_AMBULATORY_CARE_PROVIDER_SITE_OTHER)

## 2024-05-30 ENCOUNTER — Ambulatory Visit: Payer: Self-pay

## 2024-05-30 ENCOUNTER — Ambulatory Visit (INDEPENDENT_AMBULATORY_CARE_PROVIDER_SITE_OTHER): Admitting: Adult Health

## 2024-05-30 VITALS — BP 92/62 | HR 73 | Temp 99.3°F | Ht 67.0 in | Wt 206.0 lb

## 2024-05-30 DIAGNOSIS — M545 Low back pain, unspecified: Secondary | ICD-10-CM

## 2024-05-30 MED ORDER — METHYLPREDNISOLONE 4 MG PO TBPK
ORAL_TABLET | ORAL | 0 refills | Status: AC
Start: 2024-05-30 — End: ?

## 2024-05-30 MED ORDER — CYCLOBENZAPRINE HCL 10 MG PO TABS
10.0000 mg | ORAL_TABLET | Freq: Three times a day (TID) | ORAL | 0 refills | Status: AC | PRN
Start: 2024-05-30 — End: ?

## 2024-05-30 NOTE — Progress Notes (Signed)
 Subjective:    Patient ID: Shelly [name], female    DOB: [birth year], 53 y.o.   MRN: [id number]  HPI  53 year old female who  has a past medical history of Anemia, Anxiety, Arthritis, Asthma, Benign paroxysmal positional vertigo due to bilateral vestibular disorder (08/2021), Carpal tunnel syndrome, Depression, Dysrhythmia, GERD (gastroesophageal reflux disease), Intermittent palpitations, Loose body of right knee, Migraines, and Osteochondral defect.  She presents to the office today for an acute issue. She reports that for the last three weeks she has had low back pain.  He does not remember any trauma or injury reports waking up 1 morning and her back was stiff.  At home she has been using a TENS unit, taking Advil  and using a heating pad and this does provide some temporary relief while using.  She has also been doing low back stretches which helps for a short period of time and then her low back seizes up.  She has not had any issues with bowel or bladder.  Bending, twisting, walking, and change of position causes pain.  She denies any radiating pain down her legs.   Review of Systems See HPI   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 08/2021   Carpal tunnel syndrome    Right   Depression    Dysrhythmia    recent palpitations   GERD (gastroesophageal reflux disease)    Intermittent palpitations    Loose body of right knee    Migraines    Osteochondral defect    right knee    Social History   Socioeconomic History   Marital status: Married    Spouse name: Not on file   Number of children: Not on file   Years of education: Not on file   Highest education level: Not on file  Occupational History   Not on file  Tobacco Use   Smoking status: Former    Current packs/day: 0.00    Types: Cigarettes    Start date: 06/09/1989    Quit  date: 06/10/1995    Years since quitting: 28.9   Smokeless tobacco: Never  Vaping Use   Vaping status: Never Used  Substance and Sexual Activity   Alcohol  use: Yes    Alcohol /week: 0.0 standard drinks of alcohol     Comment: occasional   Drug use: No   Sexual activity: Yes    Partners: Male    Birth control/protection: Surgical  Other Topics Concern   Not on file  Social History Narrative   Not on file   Social Drivers of Health   Financial Resource Strain: Patient Declined (03/09/2023)   Overall Financial Resource Strain (CARDIA)    Difficulty of Paying Living Expenses: Patient declined  Food Insecurity: Patient Declined (03/09/2023)   Hunger Vital Sign    Worried About Running Out of Food in the Last Year: Patient declined    Ran Out of Food in the Last Year: Patient declined  Transportation Needs: Patient Declined (03/09/2023)   PRAPARE - Administrator, Civil Service (Medical): Patient declined    Lack of Transportation (Non-Medical): Patient declined  Physical Activity: Insufficiently Active (03/09/2023)   Exercise Vital Sign    Days of Exercise per Week: 2 days    Minutes of Exercise per Session: 40 min  Stress: Stress Concern Present (  03/09/2023)   [employer] [employer] - Occupational Stress Questionnaire    Feeling of Stress : Very much  Social Connections: Unknown (03/09/2023)   Social Connection and Isolation Panel    Frequency of Communication with Friends and Family: Patient declined    Frequency of Social Gatherings with Friends and Family: Patient declined    Attends Religious Services: Patient declined    Active Member of Clubs or Organizations: Patient declined    Attends Banker Meetings: Not on file    Marital Status: Patient declined  Intimate Partner Violence: Not on file    Past Surgical History:  Procedure Laterality Date   CESAREAN SECTION  06-03-2011//  09-27-2003//  2000   Bilateral Tubal Ligation w/ last  c/s   CHONDROPLASTY Right 06/11/2015   Procedure: CHONDROPLASTY;  Surgeon: Lamar Collet, MD;  Location: 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   PARTIAL KNEE ARTHROPLASTY Right 09/02/2015   Procedure: RIGHT UNI KNEE ARTHROPLASTY MEDIALLY;  Surgeon: Donnice Car, MD;  Location: [facility];  Service: Orthopedics;  Laterality: Right;   PARTIAL KNEE ARTHROPLASTY Left 05/05/2023   Procedure: UNICOMPARTMENTAL KNEE;  Surgeon: Edna Toribio LABOR, MD;  Location: [facility];  Service: Orthopedics;  Laterality: Left;   TOTAL KNEE REVISION Right 03/20/2016   Procedure: RIGHT UNI KNEE POLY EXCHANGE;  Surgeon: Donnice Car, MD;  Location: [facility];  Service: Orthopedics;  Laterality: Right;   TOTAL KNEE REVISION WITH SCAR DEBRIDEMENT/PATELLA REVISION WITH POLY EXCHANGE Right 11/11/2021   Procedure: Right unicompartmental knee arthroplasty poly revision;  Surgeon: Car Donnice, MD;  Location: [facility];  Service: Orthopedics;  Laterality: Right;   TUBAL LIGATION     WISDOM TOOTH EXTRACTION Bilateral     Family History  Problem Relation Age of Onset   Arthritis Mother    COPD Father    Emphysema Father    Sleep apnea Father    Alcohol  abuse Paternal Grandmother    Cancer Paternal Grandmother     No Known Allergies  Current Outpatient Medications on File Prior to Visit  Medication Sig Dispense Refill   cetirizine (ZYRTEC) 10 MG tablet Take 10 mg by mouth in  the morning.     FLUoxetine  (PROZAC ) 20 MG capsule TAKE 1 CAPSULE BY MOUTH EVERY DAY 90 capsule 1   NON FORMULARY Take 1 capsule by mouth See admin instructions. CBD gelcap- Take 1 gelcap by mouth prior to bed every night     omeprazole (PRILOSEC OTC) 20 MG tablet Take 20 mg by mouth daily before breakfast.     PROAIR  HFA 108 (90 Base) MCG/ACT inhaler Inhale 1-2 puffs into the lungs every 6 (six) hours as needed for wheezing or shortness of breath.     No current facility-administered medications on file prior to visit.    BP 92/62   Pulse 73   Temp 99.3 F (37.4 C) (Oral)   Ht 5' 7 (1.702 m)   Wt 206  lb (93.4 kg)   LMP 11/06/2021 (Approximate)   SpO2 99%   BMI 32.26 kg/m       Objective:   Physical Exam Vitals and nursing note reviewed.  Constitutional:      Appearance: Normal appearance.  Musculoskeletal:        General: Tenderness present.     Lumbar back: Tenderness and bony tenderness present. No deformity, signs of trauma or spasms. Decreased range of motion.  Skin:    General: Skin is warm and dry.  Neurological:     Mental Status: She is alert.         Assessment & Plan:  1. Acute bilateral low back pain without sciatica (Primary) - Appears muscular in origin but will order low back xray. Start on MSK relaxer and steroids.  - consider referral to PT  - methylPREDNISolone  (MEDROL  DOSEPAK) 4 MG TBPK tablet; Take as directed  Dispense: 21 tablet; Refill: 0 - cyclobenzaprine  (FLEXERIL ) 10 MG tablet; Take 1 tablet (10 mg total) by mouth 3 (three) times daily as needed for muscle spasms.  Dispense: 15 tablet; Refill: 0 - DG Lumbar Spine Complete; Future  Darleene Shape, NP

## 2024-05-30 NOTE — Telephone Encounter (Signed)
 FYI Only or Action Required?: FYI only for provider.  Patient was last seen in primary care on 09/15/2023 by [place], Betty G, MD.  Called Nurse Triage reporting Back Pain.  Symptoms began several weeks ago.  Interventions attempted: OTC medications: ibuprofen , Ice/heat application, and Other: TENS unit.  Symptoms are: lower back pain radiates to buttocks, stiff/weakness in legs and hips unchanged.  Triage Disposition: See HCP Within 4 Hours (Or PCP Triage)  Patient/caregiver understands and will follow disposition?: Yes                   Copied from CRM [id number]. Topic: Clinical - Red Word Triage >> May 30, 2024  8:16 AM Ernestene P wrote: [facility] that prompted transfer to Nurse Triage: lower back pain for the last 3 weeks is not getting better Reason for Disposition  [1] SEVERE back pain (e.g., excruciating, unable to do any normal activities) AND [2] not improved 2 hours after pain medicine  Answer Assessment - Initial Assessment Questions 1. ONSET: When did the pain begin?      X3 weeks, not improved.  2. LOCATION: Where does it hurt? (upper, mid or lower back)     Lower.  3. SEVERITY: How bad is the pain?  (e.g., Scale 1-10; mild, moderate, or severe)   - MILD (1-3): Doesn't interfere with normal activities.    - MODERATE (4-7): Interferes with normal activities or awakens from sleep.    - SEVERE (8-10): Excruciating pain, unable to do any normal activities.      8-9/10 if having to use her back, no pain at rest.  4. PATTERN: Is the pain constant? (e.g., yes, no; constant, intermittent)      Intermittent.  5. RADIATION: Does the pain shoot into your legs or somewhere else?     Buttocks, she states there is a heaviness in her legs when she stands but it goes away.  6. CAUSE:  What do you think is causing the back pain?      Unsure, denies chronic back pain or recent injuries.  7. BACK OVERUSE:  Any recent lifting of heavy objects, strenuous  work or exercise?     No.  8. MEDICINES: What have you taken so far for the pain? (e.g., nothing, acetaminophen , NSAIDS)     Heating pad, TENS unit, ibuprofen .  9. NEUROLOGIC SYMPTOMS: Do you have any weakness, numbness, or problems with bowel/bladder control?     Patient denies loss of bowel or bladder control. She states her lower back and hips feel stiff/stuck and weak.  10. OTHER SYMPTOMS: Do you have any other symptoms? (e.g., fever, abdomen pain, burning with urination, blood in urine)       None.  11. PREGNANCY: Is there any chance you are pregnant? When was your last menstrual period?       N/A.  Protocols used: Back Pain-A-AH

## 2024-05-31 ENCOUNTER — Ambulatory Visit: Payer: Self-pay | Admitting: Adult Health

## 2024-06-20 NOTE — Telephone Encounter (Signed)
 Please contact patient as prior MyChart message has not been read.  Repeat CPAP download as advised by Dr. Buck did show improvement of >4 hr usage now at 70%, previously 40%. Please see if daytime has improved since improved compliance. If daytime fatigue has persisted, can consider trial of wake promoting medication or MSLT.

## 2024-06-21 NOTE — Telephone Encounter (Signed)
 Called the patient and there was no answer. LVM asking the pt to call back.

## 2024-08-30 ENCOUNTER — Encounter: Payer: Self-pay | Admitting: Neurology

## 2024-08-30 ENCOUNTER — Ambulatory Visit (INDEPENDENT_AMBULATORY_CARE_PROVIDER_SITE_OTHER): Payer: Self-pay | Admitting: Neurology

## 2024-08-30 VITALS — BP 110/70 | HR 75 | Ht 67.0 in | Wt 204.6 lb

## 2024-08-30 DIAGNOSIS — G4700 Insomnia, unspecified: Secondary | ICD-10-CM

## 2024-08-30 DIAGNOSIS — Z820 Family history of epilepsy and other diseases of the nervous system: Secondary | ICD-10-CM

## 2024-08-30 DIAGNOSIS — M542 Cervicalgia: Secondary | ICD-10-CM

## 2024-08-30 DIAGNOSIS — G4733 Obstructive sleep apnea (adult) (pediatric): Secondary | ICD-10-CM

## 2024-08-30 NOTE — Progress Notes (Signed)
 Subjective:    Patient ID: Shelly [name] is a 53 y.o. female.  HPI    Interim history:   Shelly [name] is a 53 year old female with an underlying medical history of allergies, asthma, reflux disease, anemia, anxiety, depression, palpitations, migraine headaches, carpal tunnel syndrome, arthritis with status post left partial knee replacement on 05/05/2023, and obesity, who presents for follow-up consultation of her obstructive sleep apnea, on AutoPap therapy.  The patient is unaccompanied today.  She was last seen in our clinic in June 2025 by Harlene [name], at which time she was struggling with AutoPap compliance and was having difficulty sleeping.  Today, 08/30/2024: I reviewed her AutoPap compliance data from 07/30/2024 through 08/28/2024, which is a total of 30 days, during which time she used her machine 28 days with percent use days greater than 4 hours at 87%, indicating very good compliance with an average usage of 6 hours and 22 minutes, residual AHI at goal at 1.1/h, 95th percentile of pressure at 7.8 cm with a range of 5 to 11 cm with EPR of 3.  Leak on the low side with a 95th percentile at 1.1 L/min.  She reports still struggling with sleep consolidation, difficulty initiating and maintaining sleep, daytime somnolence.  She does feel better altogether when compared to before her AutoPap therapy and would like to continue with treatment, reports that her dad had chronic insomnia.  She continues to take CBD and a gummy form at night for sleep.  She is on low-dose Prozac  20 mg daily.  She is currently in between insurances, started a new job but they do not offer insurance coverage so she will try to get insurance on her own.  She has not had a physical with her PCP, is a little overdue for her physical and blood work.  She did get as needed treatment for low back pain including a Dosepak with Solu-Medrol  and a muscle relaxer, she is no longer taking either.  She tries to hydrate well, limits her  caffeine to 2 cups of coffee in the morning and occasional soda.  She has cut back her alcohol  consumption quite a bit, admits to utilizing alcohol  in the past to help fall asleep at night but drinks once every 2 weeks or so now.  She denies any overt stress but does report keeping her stress in her upper back and neck muscles.  She has had some neck pain but nothing debilitating or radiating to either side.   The patient's allergies, current medications, family history, past medical history, past social history, past surgical history and problem list were reviewed and updated as appropriate.   Previously:   04/27/2024 Shelly Bogaert, NP): <<Shelly [name] is a 53 y.o. female who was evaluated by Dr. Buck on 05/31/2023 for concern of underlying sleep apnea with complaints of snoring, excessive daytime somnolence and waking up with a sense of gasping for air.  ESS 15/24. FSS 35/63.  HST showed overall mild sleep apnea with total AHI of 10.8/h and REM AHI 24.7/hr and O2 nadir of 88%.  AutoPap initiated 08/16/2023.   At prior visit, noted excellent compliance and optimal residual AHI.  Continue to struggle with insomnia, vivid dreams and sleep paralysis as well as excessive daytime sleepiness.   Interval history:   Patient returns for follow-up visit.  She reports difficulty tolerating CPAP for long duration, reports mask occasionally get buildup of condensation despite humidification being on the lowest setting.  She also continues to wake frequently  at night, usually wakes around 1 to 2 AM and has difficulty falling back asleep, her nose will start to itch and gets overly irritated with her mask and will eventually remove it and typically falls asleep quickly after that but still sleeping restless and waking up.  She typically is in bed between 8 to 10 hours per night.  She still has excessive daytime sleepiness, ESS 14/24.  Reports her father struggled with insomnia, she is hesitant to try medications  as she does not want to rely on a medication to help her sleep and is fearful she may start one, it helps but then eventually loses effectiveness.  Unfortunately, she will be losing her insurance soon.  She will be starting a part-time job but is unsure of insurance coverage or when insurance will be reinstated.  She remains on fluoxetine , feels depression/anxiety stable.   >>  10/13/2023 Shelly Bogaert, NP): <<Shelly [name] is a 53 y.o. female who was evaluated by Dr. Buck on 05/31/2023 for concern of underlying sleep apnea with complaints of snoring, excessive daytime somnolence and waking up with a sense of gasping for air.  ESS 15/24. FSS 35/63.  HST showed overall mild sleep apnea with total AHI of 10.8/h and REM AHI 24.7/hr and O2 nadir of 88%.  AutoPap initiated 08/16/2023.   Interval history:   Reports gradual improvement of CPAP therapy.  Currently using nasal mask, tolerating well.  She does continue to experience excessive daytime sleepiness, she still struggles with insomnia, will awaken previously around 2am with difficulty falling back to sleep.  She does admit to vivid dreams and has previously experienced sleep paralysis.  ESS 11/24. FSS 31/63.   >>  05/31/2023 ([place]): (She) reports snoring and excessive daytime somnolence as well as waking up with a sense of gasping for air.  Her husband has noticed pauses in her breathing while she is asleep.  Her Epworth sleepiness score is 15 out of 24, fatigue severity score is 35 out of 63.  Her dad had sleep apnea and had a BiPAP machine.  She reports that he also had COPD/emphysema. I reviewed your office note from 03/10/2023.  Her weight has been more or less stable.  She is not back to work yet since her knee surgery, she does not take any pain medication at this time and does not have any significant pain at night, some discomfort.  She has had trouble falling asleep and staying asleep since her knee surgery.  She takes CBD at night, it is a gel  pill.  She has tried melatonin in the past and noticed that she had to increase her dose and it no longer works for her.  Bedtime is generally between 12 and 1 currently and rise time around 630, as late as 9 AM.  When she works, she has to be at work at 6 and works till [place] [employer] PM.  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  Bedtime is earlier when she is working and rise time is 5 AM.  She denies nightly nocturia or recurrent nocturnal or morning headaches.  She drinks caffeine in the form of coffee, about 2 cups in the mornings, no daily soda or tea.  She drinks alcohol  occasionally, about 3 glasses of wine per week.  She quit smoking some 30 years ago.  She has a history of asthma and has a rescue inhaler, no maintenance inhaler.  She has 3 children,  2 of them are at home, ages 20 and 58.  She lives with her husband and they also have 2 dogs in the household, the dogs do not sleep in the bedroom with them.  She does not watch TV in her bedroom.  Her Past Medical History Is Significant For: 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 08/2021   Carpal tunnel syndrome    Right   Depression    Dysrhythmia    recent palpitations   GERD (gastroesophageal reflux disease)    Intermittent palpitations    Loose body of right knee    Migraines    Osteochondral defect    right knee    Her Past Surgical History Is Significant For: Past Surgical History:  Procedure Laterality Date   CESAREAN SECTION  06-03-2011//  09-27-2003//  2000   Bilateral Tubal Ligation w/ last c/s   CHONDROPLASTY Right 06/11/2015   Procedure: CHONDROPLASTY;  Surgeon: Lamar Collet, MD;  Location: [facility];  Service: Orthopedics;  Laterality: Right;   DILATION AND CURETTAGE OF UTERUS     FOREIGN BODY REMOVAL Right 06/11/2015   Procedure:  RIGHT REMOVAL LOOSE BODY;  Surgeon: Lamar Collet, MD;  Location: [facility];  Service: Orthopedics;  Laterality: Right;   KNEE ARTHROSCOPY Right 11/23/1988   KNEE ARTHROSCOPY Right 06/11/2015   Procedure: RIGHT ARTHROSCOPY KNEE;  Surgeon: Lamar Collet, MD;  Location: [facility];  Service: Orthopedics;  Laterality: Right;   LEFT WRIST REDUCTION AND FUSION   07/19/2008   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   PARTIAL KNEE ARTHROPLASTY Right 09/02/2015   Procedure: RIGHT UNI KNEE ARTHROPLASTY MEDIALLY;  Surgeon: Donnice Car, MD;  Location: [facility];  Service: Orthopedics;  Laterality: Right;   PARTIAL KNEE ARTHROPLASTY Left 05/05/2023   Procedure: UNICOMPARTMENTAL KNEE;  Surgeon: Edna Toribio LABOR, MD;  Location: [facility];  Service: Orthopedics;  Laterality: Left;   TOTAL KNEE REVISION Right 03/20/2016   Procedure: RIGHT UNI KNEE POLY EXCHANGE;  Surgeon: Donnice Car, MD;  Location: [facility];  Service: Orthopedics;  Laterality: Right;   TOTAL KNEE REVISION WITH SCAR DEBRIDEMENT/PATELLA REVISION WITH POLY EXCHANGE Right 11/11/2021   Procedure: Right unicompartmental knee arthroplasty poly revision;  Surgeon: Car Donnice, MD;  Location: [facility];  Service: Orthopedics;  Laterality: Right;   TUBAL LIGATION     WISDOM TOOTH EXTRACTION Bilateral     Her Family History Is Significant For: Family History  Problem Relation Age of Onset   Arthritis Mother    COPD Father    Emphysema Father    Sleep apnea Father    Alcohol  abuse Paternal Grandmother    Cancer Paternal Grandmother     Her Social History Is Significant For: Social History   Socioeconomic History   Marital status: Married    Spouse name: Not on file   Number of children: Not on file   Years of education: Not on file   Highest education level: Not on file  Occupational History   Not on file  Tobacco Use   Smoking status: Former    Current packs/day: 0.00    Types: Cigarettes    Start date:  06/09/1989    Quit date: 06/10/1995    Years since quitting: 29.2   Smokeless tobacco: Never  Vaping Use   Vaping status: Never Used  Substance and Sexual Activity   Alcohol  use: Yes    Alcohol /week: 0.0 standard drinks of alcohol     Comment: occasional   Drug use: No   Sexual activity: Yes    Partners: Male    Birth control/protection: Surgical  Other Topics Concern   Not on file  Social History Narrative   2 cups of caffeine in a day    Social Drivers of Health   Financial Resource Strain: Patient Declined (03/09/2023)   Overall Financial Resource Strain (CARDIA)    Difficulty of Paying Living Expenses: Patient declined  Food Insecurity: Patient Declined (03/09/2023)   Hunger Vital Sign    Worried About Running Out of Food in the Last Year: Patient declined    Ran Out of Food in the Last Year: Patient declined  Transportation Needs: Patient Declined (03/09/2023)   PRAPARE - Administrator, Civil Service (Medical): Patient declined    Lack of Transportation (Non-Medical): Patient declined  Physical Activity: Insufficiently Active (03/09/2023)   Exercise Vital Sign    Days of Exercise per Week: 2 days    Minutes of Exercise per Session: 40 min  Stress: Stress Concern Present (03/09/2023)   [employer] of Occupational Health - Occupational Stress Questionnaire    Feeling of Stress : Very much  Social Connections: Unknown (03/09/2023)   Social Connection and Isolation Panel    Frequency of Communication with Friends and Family: Patient declined    Frequency of Social Gatherings with Friends and Family: Patient declined    Attends Religious Services: Patient declined    Database administrator or Organizations: Patient declined    Attends Banker Meetings: Not on file    Marital Status: Patient declined    Her Allergies Are:  No Known Allergies:   Her Current Medications Are:  Outpatient Encounter Medications as of 08/30/2024  Medication Sig    cetirizine (ZYRTEC) 10 MG tablet Take 10 mg by mouth in the morning.   FLUoxetine  (PROZAC ) 20 MG capsule TAKE 1 CAPSULE BY MOUTH EVERY DAY   NON FORMULARY Take 1 capsule by mouth See admin instructions. CBD gelcap- Take 1 gelcap by mouth prior to bed every night   omeprazole (PRILOSEC OTC) 20 MG tablet Take 20 mg by mouth daily before breakfast.   PROAIR  HFA 108 (90 Base) MCG/ACT inhaler Inhale 1-2 puffs into the lungs every 6 (six) hours as needed for wheezing or shortness of breath.   cyclobenzaprine  (FLEXERIL ) 10 MG tablet Take 1 tablet (10 mg total) by mouth 3 (three) times daily as needed for muscle spasms. (Patient not taking: Reported on 08/30/2024)   methylPREDNISolone  (MEDROL  DOSEPAK) 4 MG TBPK tablet Take as directed (Patient not taking: Reported on 08/30/2024)   No facility-administered encounter medications on file as of 08/30/2024.  :  Review of Systems:  Out of a complete 14 point review of systems, all are reviewed and negative with the exception of these symptoms as listed below:   Review of Systems  Neurological:        Patient is here alone for follow-up - has some concerns she would like to discuss  ESS- 17 FSS - 40    Objective:  Neurological Exam  Physical Exam Physical Examination:   Vitals:   08/30/24 1256  BP: 110/70  Pulse: 75  SpO2: 96%    General Examination: The patient is a very pleasant 53 y.o. female in no acute distress. She appears well-developed and well-nourished and well groomed.  HEENT: Normocephalic, atraumatic, pupils are equal, round and reactive to light, corrective eyeglasses in place.  Tracking is well-preserved, hearing grossly intact, face symmetric with normal facial animation, speech is clear without dysarthria, hypophonia or voice tremor.   There are no carotid bruits on auscultation. Oropharynx exam reveals: No significant mouth dryness, good dental hygiene, moderate airway crowding.  Tongue protrudes centrally and palate elevates  symmetrically.    Chest: Clear to auscultation without wheezing, rhonchi or crackles noted.   Heart: S1+S2+0, regular and normal without murmurs, rubs or gallops noted.    Abdomen: Soft, non-tender and non-distended.   Extremities: There is no obvious swelling around the ankles.     Skin: Warm and dry without trophic changes noted.    Musculoskeletal: exam reveals no obvious joint deformities.     Neurologically:  Mental status: The patient is awake, alert and oriented in all 4 spheres. Her immediate and remote memory, attention, language skills and fund of knowledge are appropriate. There is no evidence of aphasia, agnosia, apraxia or anomia. Speech is clear with normal prosody and enunciation. Thought process is linear. Mood is normal and affect is normal.  Cranial nerves II - XII are as described above under HEENT exam.  Motor exam: Normal bulk, moves all 4 extremities without difficulty. No obvious action or resting tremor.  Fine motor skills and coordination: grossly intact.  Cerebellar testing: No dysmetria or intention tremor. There is no truncal or gait ataxia.  Sensory exam: intact to light touch in the upper and lower extremities.  Gait, station and balance: She stands without difficulty and walks without a limp.    Assessment and Plan:    In summary, Shelly [name] is a 53 year old female with an underlying medical history of allergies, asthma, reflux disease, anemia, anxiety, depression, palpitations, migraine headaches, carpal tunnel syndrome, arthritis with status post left partial knee replacement on 05/05/2023, and obesity, who presents for follow-up consultation of her obstructive sleep apnea, established on AutoPap therapy for over a year now.  She has noticed benefit from using AutoPap but is struggling with chronic difficulty initiating and maintaining sleep and daytime somnolence.  We talked about different factors that affect sleep and daytime symptoms.  She is  advised to continue to work on her sleep hygiene and consistency with her AutoPap and she has made some valuable changes to her lifestyle/habits.  She is commended for her AutoPap treatment adherence and settings look good, apnea scores excellent, leak low from the mask.  She is using a nasal mask with good tolerance.  She is a Surveyor, minerals.  She tries to hydrate well, she is encouraged to make a follow-up appointment with her PCP for a full physical and blood work.  She has a history of prediabetes even, I would recommend making sure she is not anemic, her thyroid  function is okay, no deficiency in vitamin D and B12 to make sure there are no organic causes for her fatigue and somnolence.  If need be, in the future, we can certainly consider bringing her in for a laboratory attended sleep study with PAP in place and the next day nap test to see if there is a true hypersomnolence disorder.  She would have to taper off her Prozac  for this and stop her CBD and any potentially sedating medications at the time.  She is not quite keen yet on pursuing additional sleep testing.  She is working on Museum/gallery curator through [employer] and will make an appointment with her PCP  for.  She is advised to follow-up routinely in our sleep clinic to see Harlene again in about a year.  She is encouraged to keep us  posted in the interim.  I answered all her questions today and she was in agreement with our plan.   I spent 30 minutes in total face-to-face time and in reviewing records during pre-charting, more than 50% of which was spent in counseling and coordination of care, reviewing test results, reviewing medications and treatment regimen and/or in discussing or reviewing the diagnosis of OSA, excessive daytime somnolence, the prognosis and treatment options. Pertinent laboratory and imaging test results that were available during this visit with the patient were reviewed by me and considered in my medical decision making (see  chart for details).

## 2024-08-30 NOTE — Patient Instructions (Signed)
 Please continue using your AutoPap consistently.  When possible make a follow-up appointment for a physical with your PCP and get blood work done, I recommend making sure that you are not anemic, no thyroid  dysfunction, that you are not prediabetic, and that you are not deficient in vitamin D or B12. Limit your caffeine intake, stay well-hydrated, and limit alcohol  intake as you are.  Please follow-up routinely to see Harlene again in about a year, feel free to keep us  posted in the interim.  As discussed, we can certainly consider a more in-depth sleep evaluation with a laboratory sleep study overnight and a next day nap study in the future if you are still struggling with significant daytime somnolence/sleepiness.

## 2024-09-15 ENCOUNTER — Encounter: Payer: Self-pay | Admitting: Neurology

## 2024-11-19 ENCOUNTER — Other Ambulatory Visit: Payer: Self-pay | Admitting: Family Medicine

## 2024-11-19 DIAGNOSIS — F419 Anxiety disorder, unspecified: Secondary | ICD-10-CM

## 2024-11-19 DIAGNOSIS — F3341 Major depressive disorder, recurrent, in partial remission: Secondary | ICD-10-CM

## 2024-12-26 ENCOUNTER — Other Ambulatory Visit: Payer: Self-pay

## 2024-12-26 DIAGNOSIS — F419 Anxiety disorder, unspecified: Secondary | ICD-10-CM

## 2024-12-26 DIAGNOSIS — F3341 Major depressive disorder, recurrent, in partial remission: Secondary | ICD-10-CM

## 2024-12-26 MED ORDER — FLUOXETINE HCL 20 MG PO CAPS: 20.0000 mg | ORAL_CAPSULE | Freq: Every day | ORAL | 0 refills | Status: AC

## 2025-09-03 ENCOUNTER — Ambulatory Visit: Payer: Self-pay | Admitting: Adult Health
